# Patient Record
Sex: Female | Born: 1960 | Hispanic: Refuse to answer | Marital: Married | Smoking: Never smoker
Health system: Southern US, Community
[De-identification: ages and names within clinical notes are randomized; demographics above are authoritative.]

## PROBLEM LIST (undated history)

## (undated) DIAGNOSIS — I10 Essential (primary) hypertension: Secondary | ICD-10-CM

## (undated) DIAGNOSIS — D649 Anemia, unspecified: Secondary | ICD-10-CM

## (undated) DIAGNOSIS — I5042 Chronic combined systolic (congestive) and diastolic (congestive) heart failure: Secondary | ICD-10-CM

## (undated) DIAGNOSIS — E079 Disorder of thyroid, unspecified: Secondary | ICD-10-CM

## (undated) DIAGNOSIS — E785 Hyperlipidemia, unspecified: Secondary | ICD-10-CM

## (undated) HISTORY — DX: Disorder of thyroid, unspecified: E07.9

## (undated) HISTORY — DX: Hyperlipidemia, unspecified: E78.5

## (undated) HISTORY — DX: Anemia, unspecified: D64.9

## (undated) HISTORY — PX: TUBAL LIGATION: SHX77

## (undated) HISTORY — PX: OTHER SURGICAL HISTORY: SHX169

## (undated) HISTORY — DX: Morbid (severe) obesity due to excess calories: E66.01

## (undated) HISTORY — DX: Essential (primary) hypertension: I10

## (undated) HISTORY — DX: Chronic combined systolic (congestive) and diastolic (congestive) heart failure: I50.42

---

## 1998-02-12 ENCOUNTER — Emergency Department (HOSPITAL_COMMUNITY): Admission: EM | Admit: 1998-02-12 | Discharge: 1998-02-12 | Payer: Self-pay | Admitting: Emergency Medicine

## 2001-11-19 ENCOUNTER — Emergency Department (HOSPITAL_COMMUNITY): Admission: EM | Admit: 2001-11-19 | Discharge: 2001-11-19 | Payer: Self-pay | Admitting: Emergency Medicine

## 2002-09-03 ENCOUNTER — Emergency Department (HOSPITAL_COMMUNITY): Admission: EM | Admit: 2002-09-03 | Discharge: 2002-09-03 | Payer: Self-pay | Admitting: Emergency Medicine

## 2008-02-06 ENCOUNTER — Inpatient Hospital Stay (HOSPITAL_COMMUNITY): Admission: AD | Admit: 2008-02-06 | Discharge: 2008-02-06 | Payer: Self-pay | Admitting: Obstetrics & Gynecology

## 2008-02-06 DIAGNOSIS — D649 Anemia, unspecified: Secondary | ICD-10-CM | POA: Insufficient documentation

## 2008-02-06 DIAGNOSIS — D259 Leiomyoma of uterus, unspecified: Secondary | ICD-10-CM | POA: Insufficient documentation

## 2008-02-21 ENCOUNTER — Ambulatory Visit: Payer: Self-pay | Admitting: Nurse Practitioner

## 2008-02-21 DIAGNOSIS — G43909 Migraine, unspecified, not intractable, without status migrainosus: Secondary | ICD-10-CM | POA: Insufficient documentation

## 2008-02-21 DIAGNOSIS — M25569 Pain in unspecified knee: Secondary | ICD-10-CM | POA: Insufficient documentation

## 2008-02-21 LAB — CONVERTED CEMR LAB
ALT: 17 units/L (ref 0–35)
AST: 17 units/L (ref 0–37)
BUN: 14 mg/dL (ref 6–23)
Basophils Absolute: 0 10*3/uL (ref 0.0–0.1)
Basophils Relative: 1 % (ref 0–1)
Blood in Urine, dipstick: NEGATIVE
CO2: 22 meq/L (ref 19–32)
Calcium: 8.7 mg/dL (ref 8.4–10.5)
Chloride: 106 meq/L (ref 96–112)
Creatinine, Ser: 0.78 mg/dL (ref 0.40–1.20)
Eosinophils Absolute: 0.1 10*3/uL (ref 0.0–0.7)
Eosinophils Relative: 1 % (ref 0–5)
HCT: 34.5 % — ABNORMAL LOW (ref 36.0–46.0)
Lymphs Abs: 2.4 10*3/uL (ref 0.7–4.0)
MCHC: 30.1 g/dL (ref 30.0–36.0)
Monocytes Absolute: 0.7 10*3/uL (ref 0.1–1.0)
Neutrophils Relative %: 46 % (ref 43–77)
RBC: 3.98 M/uL (ref 3.87–5.11)
Sodium: 139 meq/L (ref 135–145)
TSH: 1.482 microintl units/mL (ref 0.350–5.50)
Total Bilirubin: 0.4 mg/dL (ref 0.3–1.2)
Total CHOL/HDL Ratio: 5.2
Total Protein: 7.9 g/dL (ref 6.0–8.3)
VLDL: 18 mg/dL (ref 0–40)
WBC: 5.7 10*3/uL (ref 4.0–10.5)

## 2008-02-22 ENCOUNTER — Encounter (INDEPENDENT_AMBULATORY_CARE_PROVIDER_SITE_OTHER): Payer: Self-pay | Admitting: Nurse Practitioner

## 2008-02-22 ENCOUNTER — Ambulatory Visit (HOSPITAL_COMMUNITY): Admission: RE | Admit: 2008-02-22 | Discharge: 2008-02-22 | Payer: Self-pay | Admitting: Family Medicine

## 2008-02-22 DIAGNOSIS — E7800 Pure hypercholesterolemia, unspecified: Secondary | ICD-10-CM | POA: Insufficient documentation

## 2008-02-24 ENCOUNTER — Encounter (INDEPENDENT_AMBULATORY_CARE_PROVIDER_SITE_OTHER): Payer: Self-pay | Admitting: Nurse Practitioner

## 2008-03-01 ENCOUNTER — Ambulatory Visit: Payer: Self-pay | Admitting: Obstetrics and Gynecology

## 2008-03-01 ENCOUNTER — Encounter: Payer: Self-pay | Admitting: Obstetrics and Gynecology

## 2008-03-07 ENCOUNTER — Ambulatory Visit (HOSPITAL_COMMUNITY): Admission: RE | Admit: 2008-03-07 | Discharge: 2008-03-07 | Payer: Self-pay | Admitting: Obstetrics and Gynecology

## 2008-03-20 ENCOUNTER — Ambulatory Visit: Payer: Self-pay | Admitting: Nurse Practitioner

## 2008-03-20 DIAGNOSIS — E669 Obesity, unspecified: Secondary | ICD-10-CM | POA: Insufficient documentation

## 2008-03-20 DIAGNOSIS — I110 Hypertensive heart disease with heart failure: Secondary | ICD-10-CM | POA: Insufficient documentation

## 2008-03-23 ENCOUNTER — Ambulatory Visit: Payer: Self-pay | Admitting: Obstetrics and Gynecology

## 2008-04-03 ENCOUNTER — Ambulatory Visit: Payer: Self-pay | Admitting: Nurse Practitioner

## 2008-04-05 ENCOUNTER — Telehealth (INDEPENDENT_AMBULATORY_CARE_PROVIDER_SITE_OTHER): Payer: Self-pay | Admitting: Nurse Practitioner

## 2008-05-03 ENCOUNTER — Telehealth (INDEPENDENT_AMBULATORY_CARE_PROVIDER_SITE_OTHER): Payer: Self-pay | Admitting: Nurse Practitioner

## 2008-06-13 ENCOUNTER — Encounter (INDEPENDENT_AMBULATORY_CARE_PROVIDER_SITE_OTHER): Payer: Self-pay | Admitting: Nurse Practitioner

## 2008-08-10 ENCOUNTER — Emergency Department (HOSPITAL_COMMUNITY): Admission: EM | Admit: 2008-08-10 | Discharge: 2008-08-10 | Payer: Self-pay | Admitting: Emergency Medicine

## 2008-11-07 ENCOUNTER — Encounter (INDEPENDENT_AMBULATORY_CARE_PROVIDER_SITE_OTHER): Payer: Self-pay | Admitting: Nurse Practitioner

## 2009-01-17 ENCOUNTER — Ambulatory Visit: Payer: Self-pay | Admitting: Nurse Practitioner

## 2009-01-17 LAB — CONVERTED CEMR LAB
HDL goal, serum: 40 mg/dL
LDL Goal: 160 mg/dL

## 2009-01-18 ENCOUNTER — Encounter (INDEPENDENT_AMBULATORY_CARE_PROVIDER_SITE_OTHER): Payer: Self-pay | Admitting: Nurse Practitioner

## 2009-01-18 LAB — CONVERTED CEMR LAB
AST: 14 units/L (ref 0–37)
Alkaline Phosphatase: 61 units/L (ref 39–117)
BUN: 14 mg/dL (ref 6–23)
Basophils Absolute: 0 10*3/uL (ref 0.0–0.1)
CO2: 21 meq/L (ref 19–32)
Chloride: 105 meq/L (ref 96–112)
Cholesterol: 227 mg/dL — ABNORMAL HIGH (ref 0–200)
Creatinine, Ser: 0.63 mg/dL (ref 0.40–1.20)
Glucose, Bld: 86 mg/dL (ref 70–99)
Lymphs Abs: 2.4 10*3/uL (ref 0.7–4.0)
MCHC: 32 g/dL (ref 30.0–36.0)
Monocytes Absolute: 0.7 10*3/uL (ref 0.1–1.0)
Monocytes Relative: 11 % (ref 3–12)
Neutro Abs: 3.2 10*3/uL (ref 1.7–7.7)
Neutrophils Relative %: 51 % (ref 43–77)
Sodium: 139 meq/L (ref 135–145)
TSH: 0.02 microintl units/mL — ABNORMAL LOW (ref 0.350–4.500)
Total CHOL/HDL Ratio: 4.4
Total Protein: 8.5 g/dL — ABNORMAL HIGH (ref 6.0–8.3)
WBC: 6.4 10*3/uL (ref 4.0–10.5)

## 2009-01-22 ENCOUNTER — Encounter (INDEPENDENT_AMBULATORY_CARE_PROVIDER_SITE_OTHER): Payer: Self-pay | Admitting: Nurse Practitioner

## 2009-01-22 DIAGNOSIS — E890 Postprocedural hypothyroidism: Secondary | ICD-10-CM | POA: Insufficient documentation

## 2009-04-22 ENCOUNTER — Inpatient Hospital Stay (HOSPITAL_COMMUNITY): Admission: AD | Admit: 2009-04-22 | Discharge: 2009-04-22 | Payer: Self-pay | Admitting: Obstetrics & Gynecology

## 2009-12-10 ENCOUNTER — Emergency Department (HOSPITAL_COMMUNITY): Admission: EM | Admit: 2009-12-10 | Discharge: 2009-12-10 | Payer: Self-pay | Admitting: Emergency Medicine

## 2009-12-10 ENCOUNTER — Telehealth (INDEPENDENT_AMBULATORY_CARE_PROVIDER_SITE_OTHER): Payer: Self-pay | Admitting: Nurse Practitioner

## 2010-03-15 ENCOUNTER — Emergency Department (HOSPITAL_COMMUNITY): Admission: EM | Admit: 2010-03-15 | Discharge: 2010-03-15 | Payer: Self-pay | Admitting: Family Medicine

## 2010-03-20 DIAGNOSIS — Z8639 Personal history of other endocrine, nutritional and metabolic disease: Secondary | ICD-10-CM | POA: Insufficient documentation

## 2010-03-25 ENCOUNTER — Ambulatory Visit: Payer: Self-pay | Admitting: Nurse Practitioner

## 2010-03-26 ENCOUNTER — Encounter (INDEPENDENT_AMBULATORY_CARE_PROVIDER_SITE_OTHER): Payer: Self-pay | Admitting: Nurse Practitioner

## 2010-03-26 LAB — CONVERTED CEMR LAB
AST: 15 units/L (ref 0–37)
Albumin: 3.5 g/dL (ref 3.5–5.2)
Alkaline Phosphatase: 55 units/L (ref 39–117)
Basophils Absolute: 0 10*3/uL (ref 0.0–0.1)
Basophils Relative: 0 % (ref 0–1)
Calcium: 8.9 mg/dL (ref 8.4–10.5)
Chloride: 108 meq/L (ref 96–112)
Cholesterol: 191 mg/dL (ref 0–200)
Creatinine, Ser: 0.55 mg/dL (ref 0.40–1.20)
HDL: 45 mg/dL (ref >39)
Hemoglobin: 10.5 g/dL — ABNORMAL LOW (ref 12.0–15.0)
Lymphs Abs: 2.2 10*3/uL (ref 0.7–4.0)
MCHC: 30.3 g/dL (ref 30.0–36.0)
Monocytes Absolute: 0.8 10*3/uL (ref 0.1–1.0)
Monocytes Relative: 14 % — ABNORMAL HIGH (ref 3–12)
Neutrophils Relative %: 44 % (ref 43–77)
RBC: 4.17 M/uL (ref 3.87–5.11)
RDW: 15.3 % (ref 11.5–15.5)
Sodium: 136 meq/L (ref 135–145)
TSH: 0.005 microintl units/mL — ABNORMAL LOW (ref 0.350–4.500)
Total Bilirubin: 0.3 mg/dL (ref 0.3–1.2)

## 2010-03-27 ENCOUNTER — Telehealth (INDEPENDENT_AMBULATORY_CARE_PROVIDER_SITE_OTHER): Payer: Self-pay | Admitting: Nurse Practitioner

## 2010-03-27 ENCOUNTER — Encounter (INDEPENDENT_AMBULATORY_CARE_PROVIDER_SITE_OTHER): Payer: Self-pay | Admitting: Nurse Practitioner

## 2010-03-29 ENCOUNTER — Ambulatory Visit (HOSPITAL_COMMUNITY): Admission: RE | Admit: 2010-03-29 | Discharge: 2010-03-29 | Payer: Self-pay | Admitting: Internal Medicine

## 2010-04-01 ENCOUNTER — Telehealth (INDEPENDENT_AMBULATORY_CARE_PROVIDER_SITE_OTHER): Payer: Self-pay | Admitting: Nurse Practitioner

## 2010-04-01 ENCOUNTER — Encounter (INDEPENDENT_AMBULATORY_CARE_PROVIDER_SITE_OTHER): Payer: Self-pay | Admitting: Nurse Practitioner

## 2010-04-08 ENCOUNTER — Telehealth (INDEPENDENT_AMBULATORY_CARE_PROVIDER_SITE_OTHER): Payer: Self-pay | Admitting: Nurse Practitioner

## 2010-04-16 ENCOUNTER — Encounter (HOSPITAL_COMMUNITY): Admission: RE | Admit: 2010-04-16 | Discharge: 2010-06-26 | Payer: Self-pay | Admitting: Internal Medicine

## 2010-04-25 ENCOUNTER — Ambulatory Visit: Payer: Self-pay | Admitting: Nurse Practitioner

## 2010-04-25 ENCOUNTER — Telehealth (INDEPENDENT_AMBULATORY_CARE_PROVIDER_SITE_OTHER): Payer: Self-pay | Admitting: Nurse Practitioner

## 2010-05-01 ENCOUNTER — Telehealth (INDEPENDENT_AMBULATORY_CARE_PROVIDER_SITE_OTHER): Payer: Self-pay | Admitting: Nurse Practitioner

## 2010-05-23 ENCOUNTER — Telehealth (INDEPENDENT_AMBULATORY_CARE_PROVIDER_SITE_OTHER): Payer: Self-pay | Admitting: Nurse Practitioner

## 2010-05-24 ENCOUNTER — Encounter (INDEPENDENT_AMBULATORY_CARE_PROVIDER_SITE_OTHER): Payer: Self-pay | Admitting: Nurse Practitioner

## 2010-05-29 ENCOUNTER — Encounter (INDEPENDENT_AMBULATORY_CARE_PROVIDER_SITE_OTHER): Payer: Self-pay | Admitting: Nurse Practitioner

## 2010-06-13 ENCOUNTER — Ambulatory Visit: Payer: Self-pay | Admitting: Nurse Practitioner

## 2010-06-13 ENCOUNTER — Other Ambulatory Visit: Admission: RE | Admit: 2010-06-13 | Discharge: 2010-06-13 | Payer: Self-pay | Admitting: Internal Medicine

## 2010-06-17 ENCOUNTER — Encounter (INDEPENDENT_AMBULATORY_CARE_PROVIDER_SITE_OTHER): Payer: Self-pay | Admitting: Nurse Practitioner

## 2010-06-19 ENCOUNTER — Encounter (INDEPENDENT_AMBULATORY_CARE_PROVIDER_SITE_OTHER): Payer: Self-pay | Admitting: Nurse Practitioner

## 2010-09-16 ENCOUNTER — Telehealth (INDEPENDENT_AMBULATORY_CARE_PROVIDER_SITE_OTHER): Payer: Self-pay | Admitting: Nurse Practitioner

## 2010-11-10 ENCOUNTER — Encounter: Payer: Self-pay | Admitting: Internal Medicine

## 2010-11-17 LAB — CONVERTED CEMR LAB
Bilirubin Urine: NEGATIVE
Blood in Urine, dipstick: NEGATIVE
Free T4: 2.34 ng/dL — ABNORMAL HIGH (ref 0.80–1.80)
OCCULT 1: NEGATIVE
Protein, U semiquant: NEGATIVE
Specific Gravity, Urine: >=1.03
T3, Free: 6.9 pg/mL — ABNORMAL HIGH (ref 2.3–4.2)
Urobilinogen, UA: 0.2
WBC Urine, dipstick: NEGATIVE
pH: 5

## 2010-11-21 NOTE — Progress Notes (Signed)
Summary: Urgent care visit - Dx Bronchitis  Phone Note Call from Patient Call back at [phone]   Summary of Call: think i had the flu now chest is hurting so bad hard to breath//please call back Initial call taken by: Arta Bruce,  December 10, 2009 8:17 AM  Follow-up for Phone Call        called [phone] pt states she went to Urgent Care and was treated for the flu.  She states that she has been sick since last Wednesday and she said she tried to take care of her illness herself and she couldn't take it any more when she started having the chest congestion and rattling in her chest.  She said that she will need to schedule a follow-up with her provider in 7-10 days.  She said that she would call back to schedule it.  Will place urgent care visit on your desk. Follow-up by: Levon Hedger,  December 11, 2009 4:14 PM  Additional Follow-up for Phone Call Additional follow up Details #1::        Visit reviewed - pt was dx with bronchitis. She was prescribed a z-pack, Albuterol MDI and codeine-guaifenesin for cough. She was advised to return to work without restrictions. Additional Follow-up by: [name] FNP,  December 12, 2009 8:43 AM

## 2010-11-21 NOTE — Assessment & Plan Note (Signed)
Summary: HTN   Vital Signs:  Patient profile:   50 year old female LMP:     03/13/2010 Height:      64.5 inches Weight:      253.9 pounds BMI:     43.06 BSA:     2.18 Temp:     98.3 degrees F oral Pulse rate:   93 / minute Pulse rhythm:   regular Resp:     20 per minute BP sitting:   164 / 75  (left arm) Cuff size:   large  Vitals Entered By: Levon Hedger (March 25, 2010 8:39 AM) CC: pt states it has been a while and need to be seen, Hypertension Management, Lipid Management Is Patient Diabetic? No  Does patient need assistance? Functional Status Self care Ambulation Normal LMP (date): 03/13/2010     Enter LMP: 03/13/2010 Last PAP Result negative for malignancy; + trich   CC:  pt states it has been a while and need to be seen, Hypertension Management, and Lipid Management.  History of Present Illness:  Pt into the office with complaints of heavy bleeding during menses.  Urgent care visit - May 27th Left arm tingling - occurs mostly at night She was given mobic which she is taken as ordered but has seen very little effect "I think it is arthritis"  Fibroids - known history Pt still has menses every month with heavy flow. Mother went through menopause in med 24's so pt is expecting to start soon.  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 Pt is married  Hypertension History:      She denies headache, chest pain, and palpitations.  Pt has taken any BP medication in over 1 year.  She has stopped eating Pork in an attempt to lower her blood pressure.        Positive major cardiovascular risk factors include hyperlipidemia and hypertension.  Negative major cardiovascular risk factors include female age less than 5 years old, no history of diabetes, negative family history for  ischemic heart disease, and non-tobacco-user status.        Further assessment for target organ damage reveals no history of ASHD, cardiac end-organ damage (CHF/LVH), stroke/TIA, peripheral vascular disease, renal insufficiency, or hypertensive retinopathy.    Lipid Management History:      Positive NCEP/ATP III risk factors include hypertension.  Negative NCEP/ATP III risk factors include female age less than 57 years old, non-diabetic, no family history for ischemic heart disease, non-tobacco-user status, no ASHD (atherosclerotic heart disease), no prior stroke/TIA, no peripheral vascular disease, and no history of aortic aneurysm.        She expresses no side effects from her lipid-lowering medication.  Comments include: Pt was previously on pravachol but she has not been taking in the past year.  The patient denies any symptoms to suggest myopathy or liver disease.      Allergies (verified): No Known Drug Allergies  Review of Systems CV:  Denies chest pain or discomfort. Resp:  Denies cough. GI:  Denies abdominal pain, nausea, and vomiting. Neuro:  Complains of headaches; during menses.  Physical Exam  General:  alert.  obese Head:  normocephalic.   Mouth:  poor dentation Lungs:  normal breath sounds.   Heart:  normal rate and regular rhythm.   Abdomen:  obese Msk:  up to the exam table active ROM Neurologic:  alert & oriented X3.   Psych:  Oriented X3.     Impression & Recommendations:  Problem # 1:  HYPERTENSION, BENIGN ESSENTIAL (ICD-401.1) BP is elevated  Will start medication DASH diet reviewed handout given Her updated medication list for this problem includes:    Lisinopril-hydrochlorothiazide 10-12.5 Mg Tabs (Lisinopril-hydrochlorothiazide) ..... One tablet by mouth daily for blood pressure  Orders: T-Comprehensive Metabolic Panel ([id number])  Problem # 2:  HYPERCHOLESTEROLEMIA (ICD-272.0) will check cholesterol labs today advised pt that she will likely  need to start medications Her updated medication list for this problem includes:    Pravastatin Sodium 40 Mg Tabs (Pravastatin sodium) ..... Hold  Orders: T-Lipid Profile ([id number])  Problem # 3:  FIBROIDS, UTERUS (ICD-218.9) reviewed with pt known dx - still with menses monthly  Problem # 4:  ANEMIA (ICD-285.9) will check labs today pt has a hx of fibroids  The following medications were removed from the medication list:    Tandem F 162-115.2-1 Mg Caps (Ferrous fum-iron polysacch-fa) .Marland Kitchen... 1 tablet by mouth daily Her updated medication list for this problem includes:    Ferrous Sulfate 325 (65 Fe) Mg Tabs (Ferrous sulfate) ..... One tablet by mouth daily  Orders: T-CBC w/Diff ([id number])  Problem # 5:  OBESITY (ICD-278.00) 20 pounds weight loss since last visit advised pt that she still needs to strive to lose weight Orders: Rapid HIV  ([id number])  Complete Medication List: 1)  Naprosyn 500 Mg Tabs (Naproxen) .Marland Kitchen.. 1 tablet by mouth two times a day as needed for knee pain 2)  Lisinopril-hydrochlorothiazide 10-12.5 Mg Tabs (Lisinopril-hydrochlorothiazide) .... One tablet by mouth daily for blood pressure 3)  Pravastatin Sodium 40 Mg Tabs (Pravastatin sodium) .... Hold 4)  Ferrous Sulfate 325 (65 Fe) Mg Tabs (Ferrous sulfate) .... One tablet by mouth daily 5)  Meloxicam 7.5 Mg Tabs (Meloxicam) .... One tablet by mouth two times a day as needed for pain  Other Orders: T-TSH ([id number])  Hypertension Assessment/Plan:      The patient's hypertensive risk group is category B: At least one risk factor (excluding diabetes) with no target organ damage.  Her calculated 10 year risk of coronary heart disease is 9 %.  Today's blood pressure is 164/75.  Her blood pressure goal is < 140/90.  Lipid Assessment/Plan:      Based on NCEP/ATP III, the patient's risk factor category is "2 or more risk factors and a calculated 10 year CAD risk of < 20%".  The patient's lipid goals are as  follows: Total cholesterol goal is 200; LDL cholesterol goal is 160; HDL cholesterol goal is 40; Triglyceride goal is 150.    Patient Instructions: 1)  Your labs will be checked today and you will be notified of the results 2)  Blood pressure - high today 3)  You will need to start blood pressure medication - lisinopril/HCTZ 10/12.5 by mouth and take daily 4)  monitor sodium in your diet 5)  Cholesterol - will be rechecked today.  You were on medications before but will wait to see what labs are before restarting 6)  Scheduled your next appointment for a complete physical exam 7)  You will need PAP, mammogram, u/a, microalbumin and review labs from today.   8)  Take your blood pressure medication before this visit Prescriptions: LISINOPRIL-HYDROCHLOROTHIAZIDE 10-12.5 MG TABS (LISINOPRIL-HYDROCHLOROTHIAZIDE) One tablet by mouth daily for blood pressure  #  30 x 1   Entered and Authorized by:   [name] FNP   Signed by:   [name] FNP on 03/25/2010   Method used:   Print then Give to Patient   RxID:   [id number]   Appended Document: HTN     Allergies: No Known Drug Allergies   Complete Medication List: 1)  Naprosyn 500 Mg Tabs (Naproxen) .Marland Kitchen.. 1 tablet by mouth two times a day as needed for knee pain 2)  Lisinopril-hydrochlorothiazide 10-12.5 Mg Tabs (Lisinopril-hydrochlorothiazide) .... One tablet by mouth daily for blood pressure 3)  Pravastatin Sodium 40 Mg Tabs (Pravastatin sodium) .... Hold 4)  Ferrous Sulfate 325 (65 Fe) Mg Tabs (Ferrous sulfate) .... One tablet by mouth daily 5)  Meloxicam 7.5 Mg Tabs (Meloxicam) .... One tablet by mouth two times a day as needed for pain   Laboratory Results  Date/Time Received: March 25, 2010 10:51 AM   Other Tests  Rapid HIV: negative

## 2010-11-21 NOTE — Progress Notes (Signed)
Summary: Endocrinology referral  Phone Note Outgoing Call   Summary of Call: refer to [facility] endocrinology Hyperthyroidism attached labs, thyroid uptake scan, and thyroid ultrasound results with referral Initial call taken by: [name] FNP,  April 25, 2010 10:29 AM

## 2010-11-21 NOTE — Miscellaneous (Signed)
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

## 2010-11-21 NOTE — Assessment & Plan Note (Signed)
Summary: Hyperthyroidism   Vital Signs:  Patient profile:   50 year old female Weight:      231.0 pounds BMI:     39.18 Temp:     98.0 degrees F oral Pulse rate:   88 / minute Pulse rhythm:   regular Resp:     20 per minute BP sitting:   122 / 70  (left arm) Cuff size:   large  Vitals Entered By: Levon Hedger (April 25, 2010 9:37 AM) CC: menstrual cycle started today...results from lab visit, Hypertension Management, Lipid Management Is Patient Diabetic? No Pain Assessment Patient in pain? no       Does patient need assistance? Ambulation Normal   CC:  menstrual cycle started today...results from lab visit, Hypertension Management, and Lipid Management.  History of Present Illness:  Pt into the office for f/u on thyroid uptake scan. She was orginally scheduled for a CPE but she is currently on her menses Pt had a history of hyperthyroidism dating back to 2010 but pt never came back for f/u. With recent check in 03/2010 TSH still low and pt was sent for thyroid ultrasound and most recently a thyroid uptake scan which shows diffusely enlarged gland without focal lesions  Family history of hyperthyroidism - sister also has hyperthyroidism and underwent iodine radiation therapy.  Hypertension History:      She denies headache, chest pain, and palpitations.  She notes no problems with any antihypertensive medication side effects.        Positive major cardiovascular risk factors include hyperlipidemia and hypertension.  Negative major cardiovascular risk factors include female age less than 49 years old, no history of diabetes, negative family history for ischemic heart disease, and non-tobacco-user status.        Further assessment for target organ damage reveals no history of ASHD, cardiac end-organ damage (CHF/LVH), stroke/TIA, peripheral vascular disease, renal insufficiency, or hypertensive retinopathy.    Lipid Management History:      Positive NCEP/ATP III risk factors  include hypertension.  Negative NCEP/ATP III risk factors include female age less than 40 years old, non-diabetic, no family history for ischemic heart disease, non-tobacco-user status, no ASHD (atherosclerotic heart disease), no prior stroke/TIA, no peripheral vascular disease, and no history of aortic aneurysm.        Comments include: pt is not taking medication - last lipids are doing well.      Habits & Providers  Alcohol-Tobacco-Diet     Tobacco Status: never  Exercise-Depression-Behavior     Drug Use: no     Seat Belt Use: 100     Sun Exposure: frequently  Comments: PHQ-9 score = 4  Allergies (verified): No Known Drug Allergies  Review of Systems General:  Complains of sweats and weight loss; denies sleep disorder; 20 pound weight loss since her last visit without intention. CV:  Denies palpitations. Resp:  Denies cough. GI:  Denies diarrhea.  Physical Exam  General:  alert.   Head:  normocephalic.   Lungs:  normal breath sounds.   Heart:  normal rate and regular rhythm.   Abdomen:  normal bowel sounds.   Msk:  up to the exam table Neurologic:  alert & oriented X3.   Skin:  color normal.   Psych:  Oriented X3.     Impression & Recommendations:  Problem # 1:  HYPERTHYROIDISM (ICD-242.90) thyroid uptake results reviewed with pt will refer to endocrinology  will start pt on methimazole - one tablet daily for 1 week then  increase to two times a day  Her updated medication list for this problem includes:    Methimazole 5 Mg Tabs (Methimazole) ..... One tablet by mouth two times a day for thyroid  Orders: Endocrinology Referral (Endocrine)  Problem # 2:  OBESITY (ICD-278.00) 20 pound weight loss since last visit - most likely due to #1  Problem # 3:  HYPERTENSION, BENIGN ESSENTIAL (ICD-401.1) BP stable at this time continue current medications Her updated medication list for this problem includes:    Lisinopril-hydrochlorothiazide 10-12.5 Mg Tabs  (Lisinopril-hydrochlorothiazide) ..... One tablet by mouth daily for blood pressure  Problem # 4:  HYPERCHOLESTEROLEMIA (ICD-272.0) no current meds labs ok during last visit Her updated medication list for this problem includes:    Pravastatin Sodium 40 Mg Tabs (Pravastatin sodium) ..... Hold  Complete Medication List: 1)  Naprosyn 500 Mg Tabs (Naproxen) .Marland Kitchen.. 1 tablet by mouth two times a day as needed for knee pain 2)  Lisinopril-hydrochlorothiazide 10-12.5 Mg Tabs (Lisinopril-hydrochlorothiazide) .... One tablet by mouth daily for blood pressure 3)  Pravastatin Sodium 40 Mg Tabs (Pravastatin sodium) .... Hold 4)  Ferrous Sulfate 325 (65 Fe) Mg Tabs (Ferrous sulfate) .... One tablet by mouth daily 5)  Meloxicam 7.5 Mg Tabs (Meloxicam) .... One tablet by mouth two times a day as needed for pain 6)  Methimazole 5 Mg Tabs (Methimazole) .... One tablet by mouth two times a day for thyroid  Hypertension Assessment/Plan:      The patient's hypertensive risk group is category B: At least one risk factor (excluding diabetes) with no target organ damage.  Her calculated 10 year risk of coronary heart disease is 5 %.  Today's blood pressure is 122/70.  Her blood pressure goal is < 140/90.  Lipid Assessment/Plan:      Based on NCEP/ATP III, the patient's risk factor category is "0-1 risk factors".  The patient's lipid goals are as follows: Total cholesterol goal is 200; LDL cholesterol goal is 160; HDL cholesterol goal is 40; Triglyceride goal is 150.    Patient Instructions: 1)  Hyperthyroidism - your thyroid uptake scan does show that you have an enlarged thyroid but no worrisome lesions.   2)  This means your thyroid is most likely working too hard. 3)  Will restart methamizole 5mg  by mouth two times a day  4)  You will be referred to endocrinology and will be notified of the time/date of the appointment. 5)  Schedule lab visit in 6 weeks for TSH, Free T3 and free T4 6)  Schedule an appointment  for a complete physical exam 7)  You will need PAP, EKG, u/a, microalbumin, rectal, tdap, mammogram Prescriptions: NAPROSYN 500 MG  TABS (NAPROXEN) 1 tablet by mouth two times a day as needed for knee pain  #50 x 1   Entered and Authorized by:   [name] FNP   Signed by:   [name] FNP on 04/25/2010   Method used:   Electronically to        [facility] (retail)       [address]       [place], [place]  [address]       Ph: [phone]       Fax: [phone]   RxID:   [id number] METHIMAZOLE 5 MG TABS (METHIMAZOLE) One tablet by mouth two times a day for thyroid  #60 x 1   Entered and Authorized by:   [name]  FNP   Signed by:   [name] FNP on 04/25/2010   Method used:   Electronically to        [facility] (retail)       [address]       [place], [place]  [address]       Ph: [phone]       Fax: [phone]   RxID:   [id number]

## 2010-11-21 NOTE — Letter (Signed)
Summary: [facility]/OUTPATIENT [facility]MISTR[facility]  [facility]PAT[facility]   Imported By: Arta Bruce 05/30/2010 09:59:38  _____________________________________________________________________  External Attachment:    Type:   Image     Comment:   External Document

## 2010-11-21 NOTE — Progress Notes (Signed)
Summary: Office Visit//depression screening  Office Visit//depression screening   Imported By: Arta Bruce 04/25/2010 14:03:23  _____________________________________________________________________  External Attachment:    Type:   Image     Comment:   External Document

## 2010-11-21 NOTE — Letter (Signed)
Summary: [facility]//OUTPT CHEMISTR[facility]  [facility]//OUTPT [facility]MISTR[facility]   Imported By: Arta Bruce 05/30/2010 09:58:20  _____________________________________________________________________  External Attachment:    Type:   Image     Comment:   External Document

## 2010-11-21 NOTE — Progress Notes (Signed)
Summary: NOSE KEEPS BLEEDING  Phone Note Call from Patient Call back at Home Phone [phone]   Reason for Call: Talk to Nurse Summary of Call: Brittney Ross PT. MS [name] SAYS HER NOSE KEEPS BLEEDING, AND SHE DOESNT KNOW WHY. Initial call taken by: Leodis Rains,  September 16, 2010 11:17 AM  Follow-up for Phone Call        Pt. has had several episodes of bright red blood coming from her nose. Advised to increase humidity in her house, use Saline nasal spray several times a day, May use vaseline inside her nose. Drink plenty of water. Currently not taking her iron medication advised to take daily as prescribed. Discussed holding pressure just below bridge of nose for if after doing this 3 different times and bleeding continues go to Urgent Care. She said bleeding has stopped on it's own. Encouraged to check BP @ drug store if high needs to call and schedule an appt. Or if nose bleeds continues need to schedule appt. Follow-up by: Gaylyn Cheers RN,  September 16, 2010 11:57 AM

## 2010-11-21 NOTE — Progress Notes (Signed)
Summary: TALK TO NURSE  Phone Note Call from Patient   Reason for Call: Talk to Nurse Summary of Call: PT WANTS TO TALK TO NURSE . Fuller Song HER @ [phone] THANK YOU  Initial call taken by: Cheryll Dessert,  April 08, 2010 9:23 AM  Follow-up for Phone Call        Left message on answering machine to return call.  Dutch Quint RN  April 08, 2010 3:14 PM  Spoke with pt. and discussed symptoms she is having -- feeling hot, nauseated at times, SOB -- wanted to know if this was related to hyperthyroidism.  Discussed function of thyroid and her symptoms.  Stressed importance of following up with additional labwork and meds.  Dutch Quint RN  April 08, 2010 4:40 PM   Additional Follow-up for Phone Call Additional follow up Details #1::        reviewed. approrpriate discussion with pt Additional Follow-up by: [name] FNP,  April 08, 2010 5:11 PM

## 2010-11-21 NOTE — Progress Notes (Signed)
Summary: Lab results and thyroid US  Phone Note Outgoing Call   Summary of Call: labs done during recent office visit show: 1. Anemia - known history. pt should restart her iron supplement - ferrous sulfate 325mg  by mouth twice daily 2.  hyperthyroidism - her thyroid is working too hard. noted 1 year ago with previous labs but pt did not come back so could not do further testing. since labs still abnormal would recommend that she have a thyroid ultrasound to see if she has any lumps or abnormalities in her thyroid gland.  Thyroid regulates metabolism (weight gain and loss), temperature, constipation and to some extent may cause anxiety and palpitations in hyperthyroidism Initial call taken by: [name] FNP,  March 27, 2010 8:06 AM  Follow-up for Phone Call        Spoke with pt. and advised of lab results and provider's recommendations -- will call pt. back with appt.  Dutch Quint RN  March 27, 2010 3:05 PM   Additional Follow-up for Phone Call Additional follow up Details #1::        order ptinted ok to schedule appt Additional Follow-up by: [name] FNP,  March 27, 2010 3:25 PM    Additional Follow-up for Phone Call Additional follow up Details #2::    Spoke with pt. - confirmed appt. 03/29/10. Follow-up by: Dutch Quint RN,  March 27, 2010 3:47 PM

## 2010-11-21 NOTE — Progress Notes (Signed)
Summary: NEEDS NOT FOR JOB  Phone Note Call from Patient Call back at Home Phone [phone]   Summary of Call: MARTIN PT. [employer] [place] SAYS THAT HER JOB NEEDS A NOTE FOR HER JOB. 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 Initial call taken by: Leodis Rains,  May 01, 2010 9:46 AM  Follow-up for Phone Call        forward to N.Daphine Deutscher, fnp Follow-up by: Levon Hedger,  May 01, 2010 12:29 PM  Additional Follow-up for Phone Call Additional follow up Details #1::        what kind of job does she do? Is she working outside? Additional Follow-up by: [name] FNP,  May 01, 2010 12:35 PM    Additional Follow-up for Phone Call Additional follow up Details #2::    called [phone] the person you are trying to reach phone is off or outside of the service area. 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  she said that she was told that she needed a note from her doctor for her to go back to work.  I then  explained to her that  she needs an office visit in order to receive a note for being out of work.  without being examed by pcp she will not get one. Hakim Minniefield informed of above. Follow-up by: Levon Hedger,  May 02, 2010 1:33 PM

## 2010-11-21 NOTE — Assessment & Plan Note (Signed)
Summary: Complete Physical Exam   Vital Signs:  Patient profile:   50 year old female LMP:     05/04/2010 Weight:      225.8 pounds BMI:     38.30 Temp:     98.3 degrees F oral Pulse rate:   100 / minute Pulse rhythm:   regular Resp:     20 per minute BP sitting:   120 / 76  (left arm) Cuff size:   large  Vitals Entered By: Levon Hedger (June 13, 2010 11:25 AM)  Nutrition Counseling: Patient's BMI is greater than 25 and therefore counseled on weight management options. CC: CPP Is Patient Diabetic? No Pain Assessment Patient in pain? no       Does patient need assistance? Functional Status Self care Ambulation Normal LMP (date): 05/04/2010     Enter LMP: 05/04/2010 Last PAP Result negative for malignancy; + trich   CC:  CPP.  History of Present Illness:  Pt into the office for a complete physical exam   PAP - Last PAP done in 2009. Menses - monthly.  Still with lots of cramps and heavy flow no family history of cervical or ovarian cancer.  Mammogram - Last mammogram in 2009.   No family of breast cancer  Optho - last eye exam was several years ago.  She used to wear glasses many years ago.  Dental - last dental exam was at lest 2 years ago.  tetanus - last done over 10 years ago  Habits & Providers  Alcohol-Tobacco-Diet     Alcohol drinks/day: 0     Tobacco Status: never  Exercise-Depression-Behavior     Does Patient Exercise: no     Have you felt down or hopeless? no     Have you felt little pleasure in things? no     Depression Counseling: not indicated; screening negative for depression     Drug Use: no     Seat Belt Use: 100     Sun Exposure: frequently  Comments: PHQ-9 score = 3  Allergies (verified): No Known Drug Allergies  Social History: Does Patient Exercise:  no  Review of Systems General:  Complains of sleep disorder and sweats; since she stopped taking her methamizole. Eyes:  Denies blurring. ENT:  Denies  earache. CV:  Denies chest pain or discomfort. Resp:  Denies cough. GI:  Denies abdominal pain. GU:  Denies discharge. MS:  Denies joint pain. Derm:  Denies lesion(s). Neuro:  Denies headaches. Psych:  Denies depression. Endo:  Denies excessive hunger.  Physical Exam  General:  alert.   Head:  normocephalic.   Eyes:  vision grossly intact, pupils equal, and pupils round.   Ears:  bil TM with bony landmarks present Nose:  External nasal examination shows no deformity or inflammation. Nasal mucosa are pink and moist without lesions or exudates. Mouth:  poor dentation Neck:  supple.   Chest Wall:  no deformities.   Breasts:  skin/areolae normal, no masses, and no abnormal thickening.   Lungs:  normal breath sounds.   Heart:  normal rate and regular rhythm.   Abdomen:  soft, non-tender, and normal bowel sounds.   Rectal:  external hemorrhoid(s).   Msk:  normal ROM.   Extremities:  no edema Neurologic:  alert & oriented X3.   Skin:  color normal.   Psych:  Oriented X3.    Pelvic Exam  Vulva:      normal appearance.   Urethra and Bladder:  Urethra--normal.   Vagina:      physiologic discharge.   Cervix:      midposition.   Uterus:      non-tender.   Adnexa:      nontender bilaterally.   Rectum:      heme negative stool, + external hemorrhoids.      Impression & Recommendations:  Problem # 1:  ROUTINE GYNECOLOGICAL EXAMINATION (ICD-V72.31) labs reviewed from previous visit guaiac negative PHQ-9 score = 3 EKG = LVH Orders: Hemoccult Guaiac-1 spec.(in office) (82270) UA Dipstick w/o Micro (manual) (40981) T- GC Chlamydia (19147) KOH/ WET Mount [phone]) Pap Smear, Thin Prep ( Collection of) (O1308)  Problem # 2:  UNSPECIFIED BREAST SCREENING (ICD-V76.10) self breast exam placcard given mammogram scheduled Orders: Mammogram (Screening) (Mammo)  Problem # 3:  HYPERTENSION, BENIGN ESSENTIAL (ICD-401.1) Bp stable continue DASH diet Her updated medication  list for this problem includes:    Lisinopril-hydrochlorothiazide 10-12.5 Mg Tabs (Lisinopril-hydrochlorothiazide) ..... One tablet by mouth daily for blood pressure  Orders: T-Urine Microalbumin w/creat. ratio [phone]) EKG w/ Interpretation (93000)  Problem # 4:  HYPERTHYROIDISM (ICD-242.90) procedure scheduled at [facility] on next week she was advised to stop methimazole 1 week prior to procedure which she has done The following medications were removed from the medication list:    Methimazole 5 Mg Tabs (Methimazole) ..... One tablet by mouth two times a day for thyroid  Problem # 5:  FIBROIDS, UTERUS (ICD-218.9) still with heavy flow perhaps will consider GYN referral after endocrinology   Complete Medication List: 1)  Naprosyn 500 Mg Tabs (Naproxen) .Marland Kitchen.. 1 tablet by mouth two times a day as needed for knee pain 2)  Lisinopril-hydrochlorothiazide 10-12.5 Mg Tabs (Lisinopril-hydrochlorothiazide) .... One tablet by mouth daily for blood pressure 3)  Pravastatin Sodium 40 Mg Tabs (Pravastatin sodium) .... Hold 4)  Ferrous Sulfate 325 (65 Fe) Mg Tabs (Ferrous sulfate) .... One tablet by mouth daily 5)  Meloxicam 7.5 Mg Tabs (Meloxicam) .... One tablet by mouth two times a day as needed for pain  Patient Instructions: 1)  Keep your appointment with the endocrinology on Monday. 2)  You will be notified of your pap smear results 3)  Follow up in this office in 6 months for high blood pressure or sooner if necessary  Laboratory Results   Urine Tests  Date/Time Received: June 13, 2010 11:37 AM   Routine Urinalysis   Color: lt. yellow Appearance: Clear Glucose: negative   (Normal Range: Negative) Bilirubin: negative   (Normal Range: Negative) Ketone: negative   (Normal Range: Negative) Spec. Gravity: >=1.030   (Normal Range: 1.003-1.035) Blood: negative   (Normal Range: Negative) pH: 5.0   (Normal Range: 5.0-8.0) Protein: negative   (Normal Range:  Negative) Urobilinogen: 0.2   (Normal Range: 0-1) Nitrite: negative   (Normal Range: Negative) Leukocyte Esterace: negative   (Normal Range: Negative)    Date/Time Received: June 14, 2010 7:44 AM   Wet Mount/KOH Source: vaginal WBC/hpf: 1-5 Bacteria/hpf: rare Clue cells/hpf: none Yeast/hpf: none Trichomonas/hpf: none  Stool - Occult Blood Hemmoccult #1: negative Date: 06/14/2010     EKG  Procedure date:  06/14/2010  Findings:      NSR, LVH   Prevention & Chronic Care Immunizations   Influenza vaccine: Not documented    Tetanus booster: Not documented    Pneumococcal vaccine: Not documented  Other Screening   Pap smear: negative for malignancy; + trich  (03/02/2008)    Mammogram: negative  (03/07/2008)   Smoking status: never  (  06/13/2010)  Lipids   Total Cholesterol: 191  (03/25/2010)   LDL: 126  (03/25/2010)   LDL Direct: Not documented   HDL: 45  (03/25/2010)   Triglycerides: 98  (03/25/2010)    SGOT (AST): 15  (03/25/2010)   SGPT (ALT): 19  (03/25/2010)   Alkaline phosphatase: 55  (03/25/2010)   Total bilirubin: 0.3  (03/25/2010)  Hypertension   Last Blood Pressure: 120 / 76  (06/13/2010)   Serum creatinine: 0.55  (03/25/2010)   Serum potassium 4.6  (03/25/2010)  Self-Management Support :    Hypertension self-management support: Not documented    Lipid self-management support: Not documented    Laboratory Results   Urine Tests    Routine Urinalysis   Color: lt. yellow Appearance: Clear Glucose: negative   (Normal Range: Negative) Bilirubin: negative   (Normal Range: Negative) Ketone: negative   (Normal Range: Negative) Spec. Gravity: >=1.030   (Normal Range: 1.003-1.035) Blood: negative   (Normal Range: Negative) pH: 5.0   (Normal Range: 5.0-8.0) Protein: negative   (Normal Range: Negative) Urobilinogen: 0.2   (Normal Range: 0-1) Nitrite: negative   (Normal Range: Negative) Leukocyte Esterace: negative   (Normal Range:  Negative)      Wet Mount Wet Mount KOH: Negative

## 2010-11-21 NOTE — Letter (Signed)
Summary: TEST ORDER FORM//ULTRASOUND //APPT DATE & TIME  TEST ORDER FORM//ULTRASOUND //APPT DATE & TIME   Imported By: Arta Bruce 03/28/2010 11:51:24  _____________________________________________________________________  External Attachment:    Type:   Image     Comment:   External Document

## 2010-11-21 NOTE — Progress Notes (Signed)
Summary: Office Visit//DEPRESSION SCREENING  Office Visit//DEPRESSION SCREENING   Imported By: Arta Bruce 06/14/2010 11:29:09  _____________________________________________________________________  External Attachment:    Type:   Image     Comment:   External Document

## 2010-11-21 NOTE — Letter (Signed)
Summary: test order from//radiology//appt date & time  test order from//radiology//appt date & time   Imported By: Arta Bruce 04/02/2010 12:36:26  _____________________________________________________________________  External Attachment:    Type:   Image     Comment:   External Document

## 2010-11-21 NOTE — Progress Notes (Signed)
Summary: [facility] NEEDS AUTHORIZATION  Phone Note Call from Patient   Reason for Call: Refill Medication Complaint: Breathing Problems Summary of Call: MARTIN PT. MS Schwall SAYS THAT HER PHARMACIST NEEDS AUTHORIZATION FOR HER LISINOPRIL. SHE USES [facility] ON [address] Initial call taken by: Leodis Rains,  May 23, 2010 10:33 AM  Follow-up for Phone Call        forward to N. Daphine Deutscher, fnp Follow-up by: Levon Hedger,  May 23, 2010 11:50 AM  Additional Follow-up for Phone Call Additional follow up Details #1::        Meds sent to [facility] -  inform pt Additional Follow-up by: [name] FNP,  May 23, 2010 12:16 PM    Additional Follow-up for Phone Call Additional follow up Details #2::    Levon Hedger  May 24, 2010 10:29 AM pt informed. Follow-up by: Levon Hedger,  May 24, 2010 10:29 AM  Prescriptions: LISINOPRIL-HYDROCHLOROTHIAZIDE 10-12.5 MG TABS (LISINOPRIL-HYDROCHLOROTHIAZIDE) One tablet by mouth daily for blood pressure  #30 x 5   Entered and Authorized by:   [name] FNP   Signed by:   [name] FNP on 05/23/2010   Method used:   Electronically to        [facility] (retail)       [address]       [place], [place]  [address]       Ph: [phone]       Fax: [phone]   RxID:   [id number]

## 2010-11-21 NOTE — Letter (Signed)
[facility]   Im[facility] By: Arta Bruce 05/30/2010 09:55:23  _____________________________________________________________________  External Attachment:    Type:   Image     Comment:   External Document

## 2010-11-21 NOTE — Letter (Signed)
Summary: Handout Printed  Printed Handout:  - Hyperthyroidism 

## 2010-11-21 NOTE — Progress Notes (Signed)
Summary: Need Thyroid uptake Scan  Phone Note Outgoing Call   Summary of Call: spoke with pt via phone regarding thyroid ultrasound results. She will need thyroid uptake scan given her recent results Overall prominent thyroid which is very inhomogeneous with diffuse small hypoechoic nodules , with the largest measuring 7 mm in diameter. Schedule pt on any morning around 11AM she is not taking any thyroid medciations at this time   Initial call taken by: [name] FNP,  April 01, 2010 3:51 PM  Follow-up for Phone Call        I MADE HER AN APPT  04-16-10 @ 11:00am [facility]. PPT IS AWARE OF THE APPT . Follow-up by: Cheryll Dessert,  April 01, 2010 4:55 PM  Additional Follow-up for Phone Call Additional follow up Details #1::        noted Additional Follow-up by: [name] FNP,  April 01, 2010 5:05 PM

## 2010-11-21 NOTE — Letter (Signed)
Summary: *HSN Results Follow up  [facility]  [address] [place], [place] [address]   Phone: [phone]  Fax: [phone]      06/19/2010   Brittney Ross [address] [place], [place]  [address]   Dear  Ms. [name],                            ____S.Drinkard,FNP   ____D. Gore,FNP       ____B. McPherson,MD   ____V. Rankins,MD    ____E. Mulberry,MD    __X__N. Daphine Deutscher, FNP  ____D. Reche Dixon, MD    ____K. Philipp Deputy, MD    ____Other     This letter is to inform you that your recent test(s):  ___X____Pap Smear    _______Lab Test     _______X-ray    ___X____ is within acceptable limits  _______ requires a medication change  _______ requires a follow-up lab visit  _______ requires a follow-up visit with your provider   Comments: Pap Smear is normal.       _________________________________________________________ If you have any questions, please contact our office [phone].                    Sincerely,    [name] FNP [facility]

## 2011-01-26 LAB — URINALYSIS, ROUTINE W REFLEX MICROSCOPIC
Bilirubin Urine: NEGATIVE
Ketones, ur: NEGATIVE mg/dL
Nitrite: NEGATIVE
Protein, ur: 30 mg/dL — AB
Specific Gravity, Urine: >1.03 — ABNORMAL HIGH (ref 1.005–1.030)
pH: 5.5 (ref 5.0–8.0)

## 2011-01-26 LAB — POCT PREGNANCY, URINE: Preg Test, Ur: NEGATIVE

## 2011-03-04 NOTE — Group Therapy Note (Signed)
NAMECRYSTALYN, Brittney Ross NO.:  [id number]   MEDICAL RECORD NO.:  [id number]          PATIENT TYPE:  WOC   LOCATION:  [facility]                   FACILITY:  [facility]   PHYSICIAN:  Argentina Donovan, MD        DATE OF BIRTH:  08-Jun-1961   DATE OF SERVICE:                                  CLINIC NOTE   HISTORY:  The patient is a 50 year old African-American gravida 3, para  3-0-0-3 who weighs 274 pounds and she is 5 feet 6 inches tall.  She was  referred from [facility] because of dysmenorrhea, multiple fibroids  and severe headaches.  In discussing with the patient, the headaches  come starting 2 days before her period and extending through the period  and disappear once the period is over.  The dysmenorrhea has a slightly  increased flow during her period.  It has only occurred during the last  off 4-5 months.  Ultrasound was done which showed some multiple  fibroids, however, they did not seem extremely large.  The uterus  measures 13 x 8 x 7.6 and the largest fibroid is 6 x 4 x 6 cm at the  posterior junction of the fundus.   PHYSICAL EXAMINATION:  BREASTS:  Symmetrical, pendulous and large with  no dominant masses, no nipple discharge.  ABDOMEN:  Abdomen is soft, flat, nontender.  No masses or organomegaly.  Previous vertical surgical scar from her previous 3 cesarean sections.  GU:  The external genitalia is normal.  BUS is within normal limits.  Vagina is clean and well rugated.  The cervix is clean, nulliparous.  The uterus upon palpation is markedly irregular that feels like a  pedunculated fibroid to the right of the lower uterine segment.  The  adnexa could not be palpated because of the habitus of the patient.   She has been seen at [facility] and was apparently fully worked up  blood work-wise which I have requested that she obtains copies of when  she comes back.  She said her cholesterol was high.  They told her she  had a borderline high blood pressure and  put her on a diet, but did not  give her any medicine although she said the blood pressure was in the  200s.  Today when I checked her, she had a blood pressure of 180/105.  I  am going to start her on a blood pressure medication and have her come  back in 3 weeks and will evaluate that.  If we can get her under  control, I think she will be very good candidate to perhaps see if we  can handle the menstrual headaches and her dysmenorrhea with oral  contraceptives.  The patient in the meanwhile has been given 1.8 mg of  estradiol daily for the next 7 days to see whether or not her headaches  started 2 days ago to see whether or not this will help her eliminate  her headaches.  I am going to schedule and put her on Diovan 160 q.a.m.  and hydrochlorothiazide 25 q.a.m.  We will have her  return if this blood  pressure is under control.  We will give her something for her  cholesterol once we get the reports from [facility].   IMPRESSION:  Menstrual headaches with dysmenorrhea and multiple  fibroids.           ______________________________  Argentina Donovan, MD     PR/MEDQ  D:  03/01/2008  T:  03/01/2008  Job:  [id number]

## 2011-07-14 ENCOUNTER — Encounter: Payer: Self-pay | Admitting: *Deleted

## 2011-07-15 LAB — GC/CHLAMYDIA PROBE AMP, GENITAL: GC Probe Amp, Genital: NEGATIVE

## 2011-07-15 LAB — WET PREP, GENITAL
Clue Cells Wet Prep HPF POC: NONE SEEN
Yeast Wet Prep HPF POC: NONE SEEN

## 2011-07-15 LAB — CBC
MCV: 82.7
Platelets: 242
WBC: 5.3

## 2011-07-17 ENCOUNTER — Ambulatory Visit (INDEPENDENT_AMBULATORY_CARE_PROVIDER_SITE_OTHER): Payer: BC Managed Care – PPO | Admitting: Obstetrics & Gynecology

## 2011-07-17 ENCOUNTER — Other Ambulatory Visit (HOSPITAL_COMMUNITY)
Admission: RE | Admit: 2011-07-17 | Discharge: 2011-07-17 | Disposition: A | Discharge status: Home / Self Care | Payer: BC Managed Care – PPO | Source: Ambulatory Visit | Attending: Obstetrics & Gynecology | Admitting: Obstetrics & Gynecology

## 2011-07-17 ENCOUNTER — Encounter: Payer: Self-pay | Admitting: Obstetrics & Gynecology

## 2011-07-17 DIAGNOSIS — R87619 Unspecified abnormal cytological findings in specimens from cervix uteri: Secondary | ICD-10-CM | POA: Insufficient documentation

## 2011-07-17 DIAGNOSIS — D259 Leiomyoma of uterus, unspecified: Secondary | ICD-10-CM

## 2011-07-17 DIAGNOSIS — D219 Benign neoplasm of connective and other soft tissue, unspecified: Secondary | ICD-10-CM

## 2011-07-17 LAB — POCT PREGNANCY, URINE: Preg Test, Ur: NEGATIVE

## 2011-07-17 LAB — CBC WITH DIFFERENTIAL/PLATELET
Eosinophils Relative: 2 % (ref 0–5)
HCT: 31.9 % — ABNORMAL LOW (ref 36.0–46.0)
Hemoglobin: 9.8 g/dL — ABNORMAL LOW (ref 12.0–15.0)
Lymphocytes Relative: 32 % (ref 12–46)
Lymphs Abs: 1.7 10*3/uL (ref 0.7–4.0)
MCV: 85.5 fL (ref 78.0–100.0)
Monocytes Absolute: 0.6 10*3/uL (ref 0.1–1.0)
Monocytes Relative: 12 % (ref 3–12)
RBC: 3.73 MIL/uL — ABNORMAL LOW (ref 3.87–5.11)
WBC: 5.3 10*3/uL (ref 4.0–10.5)

## 2011-07-17 LAB — COMPREHENSIVE METABOLIC PANEL
Alkaline Phosphatase: 54 U/L (ref 39–117)
CO2: 22 mEq/L (ref 19–32)
Creat: 0.83 mg/dL (ref 0.50–1.10)
Glucose, Bld: 85 mg/dL (ref 70–99)
Total Bilirubin: 0.4 mg/dL (ref 0.3–1.2)

## 2011-07-17 NOTE — Progress Notes (Signed)
  Subjective:    Patient ID: Brittney Ross, female    DOB: September 14, 1961, 50 y.o.   MRN: [id number]  HPI Ms. Vallo is a 50 yo AA G3P3 with a long history of dysmenorrhea and menorrhagia.  She has a known history of fibroids but her last u/s was in 2009. Her last pap was at [facility] and it was about a year ago.  Her last mammogram was about 2 years ago.   Review of Systems     Objective:   Physical Exam   Morbidly obese with grossly enlarged uterusEndometrial Biopsy Procedure Note  Pre-operative Diagnosis: DUB  Post-operative Diagnosis: same  Indications: abnormal uterine bleeding, enlarged uterus  Procedure Details   Urine pregnancy test was done  and result was negative.  The risks (including infection, bleeding, pain, and uterine perforation) and benefits of the procedure were explained to the patient and Verbal informed consent was obtained.  Antibiotic prophylaxis against endocarditis was not indicated.   The patient was placed in the dorsal lithotomy position.  Bimanual exam showed the uterus to be in the neutral position.  A Graves' speculum inserted in the vagina, and the cervix prepped with povidone iodine.  Endocervical curettage with a Kevorkian curette was not performed.   A sharp tenaculum was applied to the anterior lip of the cervix for stabilization.  A sterile uterine sound was used to sound the uterus to a depth of 9.5cm.  A Pipelle endometrial aspirator was used to sample the endometrium.  Sample was sent for pathologic examination.  Condition: Stable  Complications: None  Plan  The patient was advised to call for any fever or for prolonged or severe pain or bleeding. She was advised to use NSAID as needed for mild to moderate pain. She was advised to avoid vaginal intercourse for 48 hours or until the bleeding has completely stopped.  Attending Physician Documentation: I was present for or participated in the entire procedure, including opening and closing.        Assessment & Plan:  DUB and fibroids- I will begin the work up with indicated screening procedures and labs.

## 2011-07-17 NOTE — Progress Notes (Signed)
Addended by: Sherre Lain A on: 07/17/2011 04:28 PM   Modules accepted: Orders

## 2011-07-19 ENCOUNTER — Inpatient Hospital Stay (HOSPITAL_COMMUNITY)
Admission: AD | Admit: 2011-07-19 | Discharge: 2011-07-19 | Disposition: A | Discharge status: Home / Self Care | Payer: BC Managed Care – PPO | Source: Ambulatory Visit | Attending: Obstetrics & Gynecology | Admitting: Obstetrics & Gynecology

## 2011-07-19 ENCOUNTER — Inpatient Hospital Stay (HOSPITAL_COMMUNITY): Payer: BC Managed Care – PPO

## 2011-07-19 ENCOUNTER — Encounter (HOSPITAL_COMMUNITY): Payer: Self-pay | Admitting: Obstetrics and Gynecology

## 2011-07-19 DIAGNOSIS — N946 Dysmenorrhea, unspecified: Secondary | ICD-10-CM

## 2011-07-19 DIAGNOSIS — N949 Unspecified condition associated with female genital organs and menstrual cycle: Secondary | ICD-10-CM | POA: Insufficient documentation

## 2011-07-19 DIAGNOSIS — N938 Other specified abnormal uterine and vaginal bleeding: Secondary | ICD-10-CM | POA: Insufficient documentation

## 2011-07-19 LAB — CBC
HCT: 30.9 % — ABNORMAL LOW (ref 36.0–46.0)
Hemoglobin: 9.6 g/dL — ABNORMAL LOW (ref 12.0–15.0)
MCH: 26.7 pg (ref 26.0–34.0)
MCHC: 31.1 g/dL (ref 30.0–36.0)
MCV: 85.8 fL (ref 78.0–100.0)

## 2011-07-19 MED ORDER — IBUPROFEN 800 MG PO TABS
800.0000 mg | ORAL_TABLET | Freq: Once | ORAL | Status: AC
  Administered 2011-07-19: 800 mg via ORAL
  Filled 2011-07-19: qty 1

## 2011-07-19 NOTE — Progress Notes (Signed)
Had had biopsy (patient unsure of what) on Thurs. Went up thru vagina for biopsy. Started period on Friday. At 2400 awoke and a lot of blood in bed.

## 2011-07-19 NOTE — Progress Notes (Signed)
"  I had a biopsy done by Dr. Marice Potter on Thursday 07/17/11.  I had small spotting after then, but I woke up this morning @ 0000 in a pool of blood.  I've soaked four pads since then.  I have to wear double pads."

## 2011-07-19 NOTE — ED Provider Notes (Signed)
History   She was seen in the gyn clinic 2 days ago for the complaints of dysmenorrhea and DUB. She has a known history of fibroids. An emdometrial biopsy was done. She comes in tonight because her period started and she thinks it's heavier than it should be.  She also continues to complain of dysmenorrhea.  Chief Complaint  Patient presents with  . Abdominal Cramping  . Vaginal Bleeding   HPI  Pertinent Gynecological History: Menses: flow is excessive with use of 20 pads or tampons on heaviest days Bleeding: dysfunctional uterine bleeding Contraception: tubal ligation DES exposure: unknown Blood transfusions: none Sexually transmitted diseases: no past history Previous GYN Procedures: none  Last mammogram: [facility] Date:  Last pap: normal Date:    Past Medical History  Diagnosis Date  . Hyperlipidemia   . Anemia   . Thyroid disease hyperthyroidism  . Morbid obesity   . Morbid obesity   . Hypertension     Pt states she "doesn't have HTN"    Past Surgical History  Procedure Date  . Cesarean section 12/07/79  . Cesarean section 05/17/81  . Cesarean section 09/06/89  . Tubal ligation     Family History  Problem Relation Age of Onset  . Hypertension Father   . Heart disease Mother   . Hypertension Sister     History  Substance Use Topics  . Smoking status: Never Smoker   . Smokeless tobacco: Not on file  . Alcohol Use: No    Allergies: No Known Allergies  Prescriptions prior to admission  Medication Sig Dispense Refill  . acetaminophen (TYLENOL) 325 MG tablet Take 650 mg by mouth every 6 (six) hours as needed. For pain       . ibuprofen (ADVIL,MOTRIN) 200 MG tablet Take 400 mg by mouth every 6 (six) hours as needed.        . ferrous sulfate 325 (65 FE) MG tablet Take 325 mg by mouth daily with breakfast.        . hydrochlorothiazide (HYDRODIURIL) 25 MG tablet Take 25 mg by mouth daily.        . valsartan (DIOVAN) 160 MG tablet Take 160 mg by mouth daily.            ROS Physical Exam   Blood pressure 184/82, pulse 80, temperature 99.1 F (37.3 C), temperature source Oral, resp. rate 20, height 5\' 5"  (1.651 m), weight 124.467 kg (274 lb 6.4 oz), last menstrual period 06/17/2011.  Physical Exam Minimal vaginal bleeding  MAU Course  Procedures Ultrasound done, CBC essentially unchanged MDM   Assessment and Plan  Menses-reassurance given. She has a follow up appt in the gyn clinic on Oct 25th.  Tyaira Heward C. 07/19/2011, 6:56 AM

## 2011-07-22 LAB — RAPID STREP SCREEN (MED CTR MEBANE ONLY): Streptococcus, Group A Screen (Direct): NEGATIVE

## 2011-08-14 ENCOUNTER — Encounter: Payer: Self-pay | Admitting: Family Medicine

## 2011-08-14 ENCOUNTER — Ambulatory Visit (INDEPENDENT_AMBULATORY_CARE_PROVIDER_SITE_OTHER): Payer: BC Managed Care – PPO | Admitting: Obstetrics & Gynecology

## 2011-08-14 DIAGNOSIS — I10 Essential (primary) hypertension: Secondary | ICD-10-CM

## 2011-08-14 DIAGNOSIS — R102 Pelvic and perineal pain: Secondary | ICD-10-CM

## 2011-08-14 DIAGNOSIS — D259 Leiomyoma of uterus, unspecified: Secondary | ICD-10-CM

## 2011-08-14 DIAGNOSIS — N949 Unspecified condition associated with female genital organs and menstrual cycle: Secondary | ICD-10-CM

## 2011-08-14 DIAGNOSIS — Z01818 Encounter for other preprocedural examination: Secondary | ICD-10-CM

## 2011-08-14 DIAGNOSIS — G8929 Other chronic pain: Secondary | ICD-10-CM | POA: Insufficient documentation

## 2011-08-14 MED ORDER — VALSARTAN-HYDROCHLOROTHIAZIDE 160-25 MG PO TABS: 1.0000 | ORAL_TABLET | Freq: Every day | ORAL | Status: DC

## 2011-08-14 MED ORDER — DICLOFENAC SODIUM 75 MG PO TBEC: 75.0000 mg | DELAYED_RELEASE_TABLET | Freq: Two times a day (BID) | ORAL | Status: AC

## 2011-08-14 NOTE — Patient Instructions (Signed)
Hysterectomy A hysterectomy is a procedure where your womb (uterus) is surgically taken out. It will no longer be possible to have menstrual periods or to become pregnant. Removal of the tubes and ovaries (bilateral salpingo-oopherectomy) can be done during this operation as well.   An abdominal hysterectomy is done through a large cut (incision) in the abdomen made by the surgeon.   A vaginal hysterectomy is done through the vagina. There are no abdominal incisions, but there will be incisions on the inside of the vagina.   A laparoscopic assisted vaginal hysterectomy is done through 2 or 3 small incisions in the abdomen, but the uterus is removed and passed through the vagina.   A robotic assisted hysterectomy is done is done through 4 or 5 small incisions in the abdomen, but the uterus is removed and passed through the vagina. It is likely you can go home the same day after a robotic assisted surgery. Women who are going to have a hysterectomy should be tested first to make sure there is no cancer of the cervix or in the uterus. INDICATIONS FOR HYSTERECTOMY:  Persistent abnormal bleeding.   Lasting (chronic) pelvic pain.   Endometriosis. This is when the lining of the uterus (endometrium) is misplaced outside of its normal location.   Adenomyosis. This is when the endometrium tissue grows in the muscle of the uterus.   Uterine prolapse. This is when the uterus falls down into the vagina.   Cancer of the uterus or cervix that requires a radical hysterectomy, removal of the uterus, tubes, ovaries, and surrounding lymph nodes.  LET YOUR CAREGIVER KNOW ABOUT:  Allergies (especially to medicines).   Medications taken including herbs, eye drops, over the counter medications, and creams.   Use of steroids (by mouth or creams).   Past problems with anesthetics or numbing medication.   Possibility of pregnancy, if this applies.   History of blood clots (thrombophlebitis).   History of  bleeding or blood problems.   Past surgery.   Other health problems.  RISKS AND COMPLICATIONS All surgeries can have risks. Some of these risks are:  A lot of bleeding.   Injury to surrounding organs.   Infection.   Blood clots of the leg, heart, or lung.   Problems with anesthesia.   Early menopause.  BEFORE THE PROCEDURE  Do not take aspirin or blood thinners for a week before surgery, or as directed by your caregiver.   Do not eat or drink anything after midnight the night before surgery, or as directed by your caregiver.   Let your caregiver know if you get a cold or other infectious problems before surgery.   If you are being admitted the day of surgery, you should be present 60 minutes before your procedure or as told by your caregiver.  PROCEDURE   An IV (intravenous) will be placed in your arm. You will be given a drug to make you sleep (anesthetic) during surgery. You may be given a shot in the spine (spinal anesthesia) that will numb your body from the waist down. This will keep you pain-free during surgery.   When you wake from surgery, you will have the IV and a long, narrow, hollow tube (urinary catheter) draining the bladder for 1 or 2 days after surgery. This will make passing your urine easier. It also helps by keeping your bladder empty during surgery.   After surgery, you will be taken to the recovery area where a nurse will watch and check  your progress. Once you wake up, stable and taking fluids well, without other problems, you will be allowed to return to your room. Usually you will remain in the hospital 3 to 5 days. You may be given an antibiotic during and after the surgery and when you go home. Pain medication will be ordered by your caregiver while you are in the hospital and when you go home.  HOME CARE INSTRUCTIONS  Healing will take time. You will have discomfort, tenderness, swelling, and bruising at the operative site for a couple of weeks. This is  normal and will get better as time goes on.   Only take over-the-counter or prescription medicines for pain, discomfort, or fever as directed by your caregiver.   Do not take aspirin. It can cause bleeding.   Do not drive when taking pain medication.   Follow your caregiver's advice regarding diet, exercise, lifting, driving, and general activities.   Resume your usual diet as directed and allowed.   Get plenty of rest and sleep.   Do not douche, use tampons, or have sexual intercourse until your caregiver gives you permission.   Change your bandages (dressings) as directed.   Take your temperature twice a day. Write it down.   Your caregiver may recommend showers instead of baths for a few weeks.   Do not drink alcohol until your caregiver gives you permission.   If you develop constipation, you may take a mild laxative with your caregiver's permission. Bran foods and drinking fluids helps with constipation problems.   Try to have someone home with you for a week or two to help with the household activities.   Make sure you and your family understands everything about your operation and recovery.   Do not sign any legal documents until you feel normal again.   Keep all your follow-up appointments as recommended by your caregiver.  SEEK MEDICAL CARE IF:   There is swelling, redness, or increasing pain in the wound area.   Pus is coming from the wound.   You notice a bad smell from the wound or surgical dressing.   You have pain, redness, and swelling from the intravenous site.   The wound is breaking open (the edges are not staying together).   You feel dizzy or feel like fainting.   You develop pain or bleeding when you urinate.   You develop diarrhea.   You develop nausea and vomiting.   You develop abnormal vaginal discharge.   You develop a rash.   You have any type of abnormal reaction or develop an allergy to your medication.   You need stronger pain  medication for your pain.  SEEK IMMEDIATE MEDICAL CARE IF:  You have a fever.   You develop abdominal pain.   You develop chest pain.   You develop shortness of breath.   You pass out.   You develop pain, swelling, or redness of your leg.   You develop heavy vaginal bleeding with or without blood clots.  Document Released: 04/01/2001 Document Revised: 06/18/2011 Document Reviewed: 02/17/2008 [facility] Patient Information 2012 [place], [place].

## 2011-08-14 NOTE — Progress Notes (Signed)
History:  50 yo G3P3003 here today for discussion of management of dysmenorrhea, fibroids and DUB.  Pertinent Gynecological History:  Menses: flow is excessive with use of 20 pads or tampons on heaviest days  Bleeding: dysfunctional uterine bleeding  Contraception: tubal ligation  DES exposure: unknown  Blood transfusions: none  Sexually transmitted diseases: no past history  Previous GYN Procedures: none  Last mammogram: normal   Last pap: normal Date: 07/17/11  Past Medical History   Diagnosis  Date   .  Hyperlipidemia    .  Anemia    .  Thyroid disease  hyperthyroidism   .  Morbid obesity    .  Morbid obesity    .  Hypertension      Pt states she "doesn't have HTN"    Past Surgical History   Procedure  Date   .  Cesarean section  12/07/79   .  Cesarean section  05/17/81   .  Cesarean section  09/06/89   .  Tubal ligation     Family History   Problem  Relation  Age of Onset   .  Hypertension  Father    .  Heart disease  Mother    .  Hypertension  Sister     Social History   Substance Use Topics   .  Smoking status:  Never Smoker   .  Smokeless tobacco:  Not on file   .  Alcohol Use:  No   Allergies: No Known Allergies  Prescriptions prior to admission   Medications Sig  Dispense  Refill   .  acetaminophen (TYLENOL) 325 MG tablet  Take 650 mg by mouth every 6 (six) hours as needed. For pain     .  ibuprofen (ADVIL,MOTRIN) 200 MG tablet  Take 400 mg by mouth every 6 (six) hours as needed.     .  ferrous sulfate 325 (65 FE) MG tablet  Take 325 mg by mouth daily with breakfast.     .  hydrochlorothiazide (HYDRODIURIL) 25 MG tablet  Take 25 mg by mouth daily.     .  valsartan (DIOVAN) 160 MG tablet  Take 160 mg by mouth daily.       Objective:  Physical Exam Blood pressure 170/85, pulse 76, temperature 97.7 F (36.5 C), temperature source Oral, height 5\' 5"  (1.651 m), weight 273 lb 1.6 oz (123.877 kg), last menstrual period 06/17/2011. Gen: NAD Abd: Soft/NT/ND,  obese Pelvic: Deferred  Labs and Imaging 07/19/11 TRANSABDOMINAL AND TRANSVAGINAL ULTRASOUND OF PELVIS  Uterus: The uterus measures 11.1 x 6.9 x 8.5 cm. Nodular myometrial echotexture with multiple focal nodules. The largest is present in the fundus and measures about 5.4 x 4 x 4.2 cm. These are consistent with multiple leiomyomas.Small Nabothian cysts of the cervix. Endometrium: Endometrial stripe thickness measures 4.4 mm. No abnormal endometrial fluid collections.  Right ovary: Right ovary measures 2.9 x 1.7 x 1.4 cm. Suggestion of small complex hemorrhagic cyst measuring about 1.4 cm diameter. Limited visualization and characterization.  Left ovary: Left ovary measures 3.4 x 1.7 x 1.7 cm. No abnormal adnexal masses demonstrated.  Other findings: No free fluid  IMPRESSION:  Multiple uterine leiomyomas. Normal endometrial stripe thickness without significant endometrial fluid. Probable hemorrhagic cyst on the right ovary. Left ovary normal. No free pelvic fluid collections.  07/17/11 Endometrium, biopsy - BENIGN SECRETORY ENDOMETRIUM, NO ATYPIA, HYPERPLASIA OR MALIGNANCY. - POLYPOID FRAGMENTS OF BENIGN ENDOCERVICAL MUCOSA WITH NO EVIDENCE OF DYSPLASIA OR MALIGNANCY.  Lab Results  Component Value Date   WBC 5.9 07/19/2011   HGB 9.6* 07/19/2011   HCT 30.9* 07/19/2011   MCV 85.8 07/19/2011   PLT 235 07/19/2011    Assessment & Plan:  Patient desires definitive management with hysterectomy.  I proposed doing a  robotic laparoscopic-assisted vaginal hysterectomy and bilateral salpingoophorectomy. The primary surgeon will be Nicholaus Bloom, MD and patient has met with Dr. Marice Potter and will have another discussion prior to surgery.  Patient agrees with this proposed surgery.  The risks of surgery were discussed in detail with the patient including but not limited to: bleeding which may require transfusion or reoperation; infection which may require antibiotics; injury to bowel, bladder, ureters or other  surrounding organs; need for additional procedures including laparotomy; thromboembolic phenomenon, incisional problems and other postoperative/anesthesia complications. She was advised that given her cesarean sections, the bladder may be adherent to the lower uterine segment and cervix so the cervix may be left in place, meaning she will need pap smear surveillance, and she is okay with this.  Likelihood of success in alleviating the patient's menorrhagia and fibroids is 100% after the hysterectomy is performed. Routine postoperative instructions will be reviewed with the patient and her family in detail after surgery.  She was told that she will be contacted by our surgical scheduler regarding the time and date of her surgery; routine preoperative instructions of having nothing to eat or drink after midnight on the day prior to surgery and also coming to the hospital 1 1/2 hours prior to her time of surgery were also emphasized.  She was told she may be called for a preoperative appointment about a week prior to surgery and will be given further preoperative instructions at that visit.   In the meantime, bleeding precautions were reviewed. She was e-prescribed diclofenac for pain, and also prescribed valsartan-HCTZ for her HTN; needs optimal control of her HTN prior to surgery.

## 2011-08-14 NOTE — Progress Notes (Signed)
Pt was taken off all meds even BP med by her PCP due to thyroid.

## 2011-09-10 ENCOUNTER — Ambulatory Visit (INDEPENDENT_AMBULATORY_CARE_PROVIDER_SITE_OTHER): Payer: BC Managed Care – PPO | Admitting: Obstetrics & Gynecology

## 2011-09-10 ENCOUNTER — Encounter: Payer: Self-pay | Admitting: Obstetrics & Gynecology

## 2011-09-10 VITALS — BP 126/66 | HR 84 | Temp 99.2°F | Ht 64.75 in | Wt 274.0 lb

## 2011-09-10 DIAGNOSIS — D259 Leiomyoma of uterus, unspecified: Secondary | ICD-10-CM

## 2011-09-10 DIAGNOSIS — D219 Benign neoplasm of connective and other soft tissue, unspecified: Secondary | ICD-10-CM

## 2011-09-10 DIAGNOSIS — D649 Anemia, unspecified: Secondary | ICD-10-CM

## 2011-09-10 DIAGNOSIS — N920 Excessive and frequent menstruation with regular cycle: Secondary | ICD-10-CM

## 2011-09-10 NOTE — Progress Notes (Signed)
  Subjective:    Patient ID: Brittney Ross, female    DOB: 1961-10-18, 50 y.o.   MRN: [id number]  HPI  Brittney Ross is already scheduled for a robotic hyst and BSO on January 15,2013. She is here for a BP follow up since Dr. Lynetta Mare started her on a antihypertensive last month.   Review of Systems     Objective:   Physical Exam        Assessment & Plan:  Symptomatic fibroids- for surgery in January.  She will continue her meds. I have strongly recommended that she drink a bottle of Mag citrate the day before surgery.

## 2011-09-25 ENCOUNTER — Encounter (HOSPITAL_COMMUNITY): Admission: RE | Payer: Self-pay | Source: Ambulatory Visit

## 2011-09-25 ENCOUNTER — Ambulatory Visit (HOSPITAL_COMMUNITY)
Admission: RE | Admit: 2011-09-25 | Payer: BC Managed Care – PPO | Source: Ambulatory Visit | Admitting: Obstetrics & Gynecology

## 2011-09-25 SURGERY — ROBOTIC ASSISTED TOTAL HYSTERECTOMY WITH BILATERAL SALPINGO OOPHORECTOMY
Anesthesia: General | Laterality: Bilateral

## 2011-10-22 ENCOUNTER — Encounter (HOSPITAL_COMMUNITY): Payer: Self-pay

## 2011-10-30 ENCOUNTER — Encounter (HOSPITAL_COMMUNITY)
Admission: RE | Admit: 2011-10-30 | Discharge: 2011-10-30 | Disposition: A | Discharge status: Home / Self Care | Payer: BC Managed Care – PPO | Source: Ambulatory Visit | Attending: Obstetrics & Gynecology | Admitting: Obstetrics & Gynecology

## 2011-10-30 ENCOUNTER — Encounter (HOSPITAL_COMMUNITY): Payer: Self-pay

## 2011-10-30 ENCOUNTER — Other Ambulatory Visit: Payer: Self-pay

## 2011-10-30 LAB — CBC
Hemoglobin: 9.8 g/dL — ABNORMAL LOW (ref 12.0–15.0)
MCH: 26.6 pg (ref 26.0–34.0)
MCHC: 30.4 g/dL (ref 30.0–36.0)
RDW: 16.6 % — ABNORMAL HIGH (ref 11.5–15.5)

## 2011-10-30 LAB — SURGICAL PCR SCREEN: Staphylococcus aureus: NEGATIVE

## 2011-10-30 LAB — BASIC METABOLIC PANEL
BUN: 13 mg/dL (ref 6–23)
Calcium: 8.8 mg/dL (ref 8.4–10.5)
GFR calc non Af Amer: >90 mL/min (ref >90)
Glucose, Bld: 96 mg/dL (ref 70–99)
Sodium: 136 mEq/L (ref 135–145)

## 2011-10-30 NOTE — Patient Instructions (Signed)
   Your procedure is scheduled on: Tuesday Jan. 15 at 0730am  Enter through the Main Entrance of [facility] at: 6:00am Pick up the phone at the desk and dial [phone] and inform us of your arrival.  Please call this number if you have any problems the morning of surgery: [phone]  Remember: Do not eat food after midnight: Sunday Do not drink clear liquids after:Monday Take these medicines the morning of surgery with a SIP OF WATER:  Voltaren Diovan  Do not wear jewelry, make-up, or FINGER nail polish Do not wear lotions, powders, perfumes or deodorant. Do not shave 48 hours prior to surgery. Do not bring valuables to the hospital.  Leave suitcase in the car. After Surgery it may be brought to your room. For patients being admitted to the hospital, checkout time is 11:00am the day of discharge.  Patients discharged on the day of surgery will not be allowed to drive home.    Remember to use your hibiclens as instructed.Please shower with 1/2 bottle the evening before your surgery and the other 1/2 bottle the morning of surgery.

## 2011-10-30 NOTE — Anesthesia Preprocedure Evaluation (Addendum)
Anesthesia Evaluation  Patient identified by MRN, date of birth, ID band Patient awake    Reviewed: Allergy & Precautions, H&P   Airway Mallampati: III TM Distance: >3 FB Neck ROM: full    Dental No notable dental hx. (+) Teeth Intact   Pulmonary neg pulmonary ROS,  clear to auscultation  Pulmonary exam normal       Cardiovascular hypertension, On Medications neg cardio ROS regular Normal    Neuro/Psych  Headaches,    GI/Hepatic negative GI ROS, Neg liver ROS,   Endo/Other  Negative Endocrine ROSHyperthyroidism Morbid obesityHyperlipidemia Hx/o Grave's Disease S/P RAI Rx  Renal/GU negative Renal ROS  Genitourinary negative   Musculoskeletal   Abdominal Normal abdominal exam  (+)   Peds  Hematology negative hematology ROS (+)   Anesthesia Other Findings   Reproductive/Obstetrics negative OB ROS                           Anesthesia Physical Anesthesia Plan  ASA: III  Anesthesia Plan:    Post-op Pain Management:    Induction:   Airway Management Planned:   Additional Equipment:   Intra-op Plan:   Post-operative Plan:   Informed Consent: I have reviewed the patients History and Physical, chart, labs and discussed the procedure including the risks, benefits and alternatives for the proposed anesthesia with the patient or authorized representative who has indicated his/her understanding and acceptance.   Dental Advisory Given  Plan Discussed with: Anesthesiologist and Surgeon  Anesthesia Plan Comments:         Anesthesia Quick Evaluation

## 2011-11-04 ENCOUNTER — Ambulatory Visit (HOSPITAL_COMMUNITY)
Admission: RE | Admit: 2011-11-04 | Discharge: 2011-11-04 | Disposition: A | Discharge status: Home / Self Care | Payer: BC Managed Care – PPO | Source: Ambulatory Visit | Attending: Obstetrics & Gynecology | Admitting: Obstetrics & Gynecology

## 2011-11-04 ENCOUNTER — Encounter (HOSPITAL_COMMUNITY): Payer: Self-pay | Admitting: Anesthesiology

## 2011-11-04 ENCOUNTER — Encounter (HOSPITAL_COMMUNITY): Payer: Self-pay | Admitting: General Practice

## 2011-11-04 ENCOUNTER — Encounter (HOSPITAL_COMMUNITY)
Admission: RE | Disposition: A | Discharge status: Home / Self Care | Payer: Self-pay | Source: Ambulatory Visit | Attending: Obstetrics & Gynecology

## 2011-11-04 ENCOUNTER — Encounter (HOSPITAL_COMMUNITY): Payer: Self-pay | Admitting: *Deleted

## 2011-11-04 ENCOUNTER — Other Ambulatory Visit: Payer: Self-pay | Admitting: Obstetrics & Gynecology

## 2011-11-04 ENCOUNTER — Ambulatory Visit: Admit: 2011-11-04 | Payer: Self-pay | Admitting: Obstetrics & Gynecology

## 2011-11-04 ENCOUNTER — Ambulatory Visit (HOSPITAL_COMMUNITY): Payer: BC Managed Care – PPO | Admitting: Anesthesiology

## 2011-11-04 DIAGNOSIS — E669 Obesity, unspecified: Secondary | ICD-10-CM | POA: Insufficient documentation

## 2011-11-04 DIAGNOSIS — N938 Other specified abnormal uterine and vaginal bleeding: Secondary | ICD-10-CM

## 2011-11-04 DIAGNOSIS — N949 Unspecified condition associated with female genital organs and menstrual cycle: Secondary | ICD-10-CM

## 2011-11-04 DIAGNOSIS — N920 Excessive and frequent menstruation with regular cycle: Secondary | ICD-10-CM | POA: Insufficient documentation

## 2011-11-04 DIAGNOSIS — N946 Dysmenorrhea, unspecified: Secondary | ICD-10-CM

## 2011-11-04 DIAGNOSIS — Z01818 Encounter for other preprocedural examination: Secondary | ICD-10-CM | POA: Insufficient documentation

## 2011-11-04 DIAGNOSIS — D649 Anemia, unspecified: Secondary | ICD-10-CM

## 2011-11-04 DIAGNOSIS — Z01812 Encounter for preprocedural laboratory examination: Secondary | ICD-10-CM | POA: Insufficient documentation

## 2011-11-04 SURGERY — ROBOTIC ASSISTED TOTAL HYSTERECTOMY WITH BILATERAL SALPINGO OOPHORECTOMY
Anesthesia: Choice | Site: Abdomen

## 2011-11-04 SURGERY — ROBOTIC ASSISTED TOTAL HYSTERECTOMY WITH BILATERAL SALPINGO OOPHORECTOMY
Anesthesia: General | Site: Abdomen | Wound class: Clean Contaminated

## 2011-11-04 MED ORDER — ACETAMINOPHEN 10 MG/ML IV SOLN
INTRAVENOUS | Status: DC | PRN
  Administered 2011-11-04: 1000 mg via INTRAVENOUS

## 2011-11-04 MED ORDER — ARTIFICIAL TEARS OP OINT
TOPICAL_OINTMENT | OPHTHALMIC | Status: DC | PRN
  Administered 2011-11-04: 1 via OPHTHALMIC

## 2011-11-04 MED ORDER — ACETAMINOPHEN 10 MG/ML IV SOLN
1000.0000 mg | Freq: Four times a day (QID) | INTRAVENOUS | Status: DC
  Filled 2011-11-04 (×4): qty 100

## 2011-11-04 MED ORDER — PROMETHAZINE HCL 25 MG/ML IJ SOLN: 12.5000 mg | INTRAMUSCULAR | Status: DC | PRN

## 2011-11-04 MED ORDER — IBUPROFEN 800 MG PO TABS: 800.0000 mg | ORAL_TABLET | Freq: Three times a day (TID) | ORAL | Status: DC | PRN

## 2011-11-04 MED ORDER — EPHEDRINE SULFATE 50 MG/ML IJ SOLN
INTRAMUSCULAR | Status: DC | PRN
  Administered 2011-11-04 (×2): 10 mg via INTRAVENOUS
  Administered 2011-11-04: 5 mg via INTRAVENOUS
  Administered 2011-11-04: 15 mg via INTRAVENOUS

## 2011-11-04 MED ORDER — BUPIVACAINE HCL (PF) 0.5 % IJ SOLN
INTRAMUSCULAR | Status: AC
  Filled 2011-11-04: qty 30

## 2011-11-04 MED ORDER — MIDAZOLAM HCL 5 MG/5ML IJ SOLN
INTRAMUSCULAR | Status: DC | PRN
  Administered 2011-11-04: 2 mg via INTRAVENOUS

## 2011-11-04 MED ORDER — PROMETHAZINE HCL 25 MG/ML IJ SOLN: 6.2500 mg | INTRAMUSCULAR | Status: DC | PRN

## 2011-11-04 MED ORDER — LIDOCAINE HCL (CARDIAC) 20 MG/ML IV SOLN
INTRAVENOUS | Status: DC | PRN
  Administered 2011-11-04: 100 mg via INTRAVENOUS

## 2011-11-04 MED ORDER — DEXAMETHASONE SODIUM PHOSPHATE 10 MG/ML IJ SOLN
INTRAMUSCULAR | Status: DC | PRN
  Administered 2011-11-04: 10 mg via INTRAVENOUS

## 2011-11-04 MED ORDER — SIMETHICONE 80 MG PO CHEW: 80.0000 mg | CHEWABLE_TABLET | Freq: Four times a day (QID) | ORAL | Status: DC | PRN

## 2011-11-04 MED ORDER — NEOSTIGMINE METHYLSULFATE 1 MG/ML IJ SOLN
INTRAMUSCULAR | Status: DC | PRN
  Administered 2011-11-04: 5 mg via INTRAVENOUS

## 2011-11-04 MED ORDER — KETOROLAC TROMETHAMINE 30 MG/ML IJ SOLN
30.0000 mg | Freq: Once | INTRAMUSCULAR | Status: AC
  Administered 2011-11-04: 30 mg via INTRAVENOUS

## 2011-11-04 MED ORDER — LACTATED RINGERS IV SOLN
INTRAVENOUS | Status: DC
  Administered 2011-11-04 (×3): via INTRAVENOUS

## 2011-11-04 MED ORDER — INDIGOTINDISULFONATE SODIUM 8 MG/ML IJ SOLN
INTRAMUSCULAR | Status: DC | PRN
  Administered 2011-11-04: 5 mL via INTRAVENOUS

## 2011-11-04 MED ORDER — FENTANYL CITRATE 0.05 MG/ML IJ SOLN
INTRAMUSCULAR | Status: DC | PRN
  Administered 2011-11-04 (×2): 50 ug via INTRAVENOUS
  Administered 2011-11-04: 100 ug via INTRAVENOUS
  Administered 2011-11-04: 50 ug via INTRAVENOUS

## 2011-11-04 MED ORDER — ACETAMINOPHEN 325 MG PO TABS: 325.0000 mg | ORAL_TABLET | ORAL | Status: DC | PRN

## 2011-11-04 MED ORDER — LACTATED RINGERS IR SOLN
Status: DC | PRN
  Administered 2011-11-04: 3000 mL

## 2011-11-04 MED ORDER — BUPIVACAINE HCL (PF) 0.25 % IJ SOLN
INTRAMUSCULAR | Status: AC
  Filled 2011-11-04: qty 30

## 2011-11-04 MED ORDER — SCOPOLAMINE 1 MG/3DAYS TD PT72: 1.0000 | MEDICATED_PATCH | Freq: Once | TRANSDERMAL | Status: DC

## 2011-11-04 MED ORDER — CEFAZOLIN SODIUM 1-5 GM-% IV SOLN
INTRAVENOUS | Status: AC
  Administered 2011-11-04: 1 g via INTRAVENOUS
  Filled 2011-11-04: qty 50

## 2011-11-04 MED ORDER — ROPIVACAINE HCL 5 MG/ML IJ SOLN
INTRAMUSCULAR | Status: DC | PRN
  Administered 2011-11-04 (×2): 30 mL

## 2011-11-04 MED ORDER — FENTANYL CITRATE 0.05 MG/ML IJ SOLN: 25.0000 ug | INTRAMUSCULAR | Status: DC | PRN

## 2011-11-04 MED ORDER — ROCURONIUM BROMIDE 100 MG/10ML IV SOLN
INTRAVENOUS | Status: DC | PRN
  Administered 2011-11-04: 10 mg via INTRAVENOUS
  Administered 2011-11-04: 40 mg via INTRAVENOUS
  Administered 2011-11-04 (×4): 10 mg via INTRAVENOUS

## 2011-11-04 MED ORDER — DEXTROSE IN LACTATED RINGERS 5 % IV SOLN
INTRAVENOUS | Status: DC
  Administered 2011-11-04: 17:00:00 via INTRAVENOUS

## 2011-11-04 MED ORDER — STERILE WATER FOR IRRIGATION IR SOLN
Status: DC | PRN
  Administered 2011-11-04: 1000 mL via INTRAVESICAL

## 2011-11-04 MED ORDER — MEPERIDINE HCL 25 MG/ML IJ SOLN: 6.2500 mg | INTRAMUSCULAR | Status: DC | PRN

## 2011-11-04 MED ORDER — PROPOFOL 10 MG/ML IV EMUL
INTRAVENOUS | Status: DC | PRN
  Administered 2011-11-04: 200 mg via INTRAVENOUS

## 2011-11-04 MED ORDER — IBUPROFEN 800 MG PO TABS: 800.0000 mg | ORAL_TABLET | Freq: Three times a day (TID) | ORAL | Status: AC | PRN

## 2011-11-04 MED ORDER — KETOROLAC TROMETHAMINE 30 MG/ML IJ SOLN
INTRAMUSCULAR | Status: AC
  Administered 2011-11-04: 30 mg via INTRAVENOUS
  Filled 2011-11-04: qty 1

## 2011-11-04 MED ORDER — DEXTROSE IN LACTATED RINGERS 5 % IV SOLN: INTRAVENOUS | Status: DC

## 2011-11-04 MED ORDER — ONDANSETRON HCL 4 MG/2ML IJ SOLN
INTRAMUSCULAR | Status: DC | PRN
  Administered 2011-11-04: 4 mg via INTRAVENOUS

## 2011-11-04 MED ORDER — OXYCODONE-ACETAMINOPHEN 5-325 MG PO TABS: 1.0000 | ORAL_TABLET | ORAL | Status: AC | PRN

## 2011-11-04 MED ORDER — GLYCOPYRROLATE 0.2 MG/ML IJ SOLN
INTRAMUSCULAR | Status: DC | PRN
  Administered 2011-11-04: 1 mg via INTRAVENOUS
  Administered 2011-11-04: 0.1 mg via INTRAVENOUS

## 2011-11-04 MED ORDER — OXYCODONE-ACETAMINOPHEN 5-325 MG PO TABS: 1.0000 | ORAL_TABLET | ORAL | Status: DC | PRN

## 2011-11-04 MED ORDER — CEFAZOLIN SODIUM 1-5 GM-% IV SOLN: 1.0000 g | INTRAVENOUS | Status: DC

## 2011-11-04 SURGICAL SUPPLY — 64 items
ADH SKN CLS APL DERMABOND .7 (GAUZE/BANDAGES/DRESSINGS) ×2
APL SKNCLS STERI-STRIP NONHPOA (GAUZE/BANDAGES/DRESSINGS) ×2
BAG URINE DRAINAGE (UROLOGICAL SUPPLIES) ×3 IMPLANT
BARRIER ADHS 3X4 INTERCEED (GAUZE/BANDAGES/DRESSINGS) ×3 IMPLANT
BENZOIN TINCTURE PRP APPL 2/3 (GAUZE/BANDAGES/DRESSINGS) ×3 IMPLANT
BRR ADH 4X3 ABS CNTRL BYND (GAUZE/BANDAGES/DRESSINGS) ×2
CABLE HIGH FREQUENCY MONO STRZ (ELECTRODE) ×3 IMPLANT
CATH FOLEY 3WAY  5CC 16FR (CATHETERS) ×1
CATH FOLEY 3WAY 5CC 16FR (CATHETERS) ×2 IMPLANT
CHLORAPREP W/TINT 26ML (MISCELLANEOUS) ×3 IMPLANT
CLOTH BEACON ORANGE TIMEOUT ST (SAFETY) ×3 IMPLANT
CONT PATH 16OZ SNAP LID 3702 (MISCELLANEOUS) ×3 IMPLANT
COVER MAYO STAND STRL (DRAPES) ×3 IMPLANT
COVER TABLE BACK 60X90 (DRAPES) ×6 IMPLANT
COVER TIP SHEARS 8 DVNC (MISCELLANEOUS) ×2 IMPLANT
COVER TIP SHEARS 8MM DA VINCI (MISCELLANEOUS) ×1
DECANTER SPIKE VIAL GLASS SM (MISCELLANEOUS) ×3 IMPLANT
DERMABOND ADVANCED (GAUZE/BANDAGES/DRESSINGS) ×1
DERMABOND ADVANCED .7 DNX12 (GAUZE/BANDAGES/DRESSINGS) ×2 IMPLANT
DRAPE HUG U DISPOSABLE (DRAPE) ×3 IMPLANT
DRAPE LG THREE QUARTER DISP (DRAPES) ×6 IMPLANT
DRAPE MONITOR DA VINCI (DRAPE) IMPLANT
DRAPE WARM FLUID 44X44 (DRAPE) ×3 IMPLANT
ELECT REM PT RETURN 9FT ADLT (ELECTROSURGICAL) ×3
ELECTRODE REM PT RTRN 9FT ADLT (ELECTROSURGICAL) ×2 IMPLANT
EVACUATOR SMOKE 8.L (FILTER) ×3 IMPLANT
GAUZE VASELINE 3X9 (GAUZE/BANDAGES/DRESSINGS) IMPLANT
GLOVE BIO SURGEON STRL SZ 6.5 (GLOVE) ×9 IMPLANT
GLOVE ECLIPSE 6.5 STRL STRAW (GLOVE) ×9 IMPLANT
GOWN STRL REIN XL XLG (GOWN DISPOSABLE) ×18 IMPLANT
KIT ACCESSORY DA VINCI DISP (KITS) ×1
KIT ACCESSORY DVNC DISP (KITS) ×2 IMPLANT
KIT DISP ACCESSORY 4 ARM (KITS) IMPLANT
NDL INSUFFLATION 14GA 120MM (NEEDLE) ×2 IMPLANT
NDL SPNL 18GX3.5 QUINCKE PK (NEEDLE) IMPLANT
NEEDLE INSUFFLATION 14GA 120MM (NEEDLE) ×3 IMPLANT
NEEDLE SPNL 18GX3.5 QUINCKE PK (NEEDLE) ×3 IMPLANT
NS IRRIG 1000ML POUR BTL (IV SOLUTION) ×9 IMPLANT
OCCLUDER COLPOPNEUMO (BALLOONS) ×6 IMPLANT
PACK LAVH (CUSTOM PROCEDURE TRAY) ×3 IMPLANT
PAD PREP 24X48 CUFFED NSTRL (MISCELLANEOUS) ×6 IMPLANT
PLUG CATH AND CAP STER (CATHETERS) ×3 IMPLANT
POSITIONER SURGICAL ARM (MISCELLANEOUS) ×6 IMPLANT
SET IRRIG TUBING LAPAROSCOPIC (IRRIGATION / IRRIGATOR) ×3 IMPLANT
SOLUTION ELECTROLUBE (MISCELLANEOUS) ×3 IMPLANT
STRIP CLOSURE SKIN 1/2X4 (GAUZE/BANDAGES/DRESSINGS) ×3 IMPLANT
SUT VIC AB 0 CT1 27 (SUTURE) ×15
SUT VIC AB 0 CT1 27XBRD ANBCTR (SUTURE) ×10 IMPLANT
SUT VIC AB 0 CT1 27XBRD ANTBC (SUTURE) IMPLANT
SUT VIC AB 2-0 CT2 27 (SUTURE) IMPLANT
SUT VICRYL 0 UR6 27IN ABS (SUTURE) ×6 IMPLANT
SUT VICRYL RAPIDE 4/0 PS 2 (SUTURE) ×6 IMPLANT
SYR 50ML LL SCALE MARK (SYRINGE) ×3 IMPLANT
TIP UTERINE 5.1X6CM LAV DISP (MISCELLANEOUS) IMPLANT
TIP UTERINE 6.7X10CM GRN DISP (MISCELLANEOUS) ×1 IMPLANT
TIP UTERINE 6.7X6CM WHT DISP (MISCELLANEOUS) IMPLANT
TIP UTERINE 6.7X8CM BLUE DISP (MISCELLANEOUS) IMPLANT
TOWEL OR 17X24 6PK STRL BLUE (TOWEL DISPOSABLE) ×6 IMPLANT
TROCAR DISP BLADELESS 8 DVNC (TROCAR) ×2 IMPLANT
TROCAR DISP BLADELESS 8MM (TROCAR) ×1
TROCAR XCEL 12X100 BLDLESS (ENDOMECHANICALS) ×3 IMPLANT
TROCAR Z-THREAD 12X150 (TROCAR) ×3 IMPLANT
TROCAR Z-THREAD BLADED 12X100M (TROCAR) IMPLANT
TUBING FILTER THERMOFLATOR (ELECTROSURGICAL) ×3 IMPLANT

## 2011-11-04 NOTE — Op Note (Addendum)
11/04/2011  11:51 AM  PATIENT:  Brittney Ross  51 y.o. female  PRE-OPERATIVE DIAGNOSIS:  Dysfunctional Uterine Bleeding;  Fibroids; Chronic Pelvic Pain  POST-OPERATIVE DIAGNOSIS:  Dysfunctional Uterine Bleeding; Fibroids; Chronic Pelvic Pain; Endometriosis; Pelvic Adhesions  PROCEDURE:  Procedure(s): ROBOTIC ASSISTED TOTAL HYSTERECTOMY WITH BILATERAL SALPINGO OOPHERECTOMY ROBOTIC ASSISTED LAPAROSCOPIC LYSIS OF ADHESION CYSTOSCOPY  SURGEON:  Nicholaus Bloom, MD  PHYSICIAN ASSISTANT:   ASSISTANTS: Jonette Eva, MD   ANESTHESIA:   general  EBL:  Total I/O In: 2400 [I.V.:2400] Out: 625 [Urine:325; Blood:300]  BLOOD ADMINISTERED:none  DRAINS: none   LOCAL MEDICATIONS USED:  OTHER ropivicaine 60 cc  SPECIMEN:  Source of Specimen:  uterus, tubes, ovaries  DISPOSITION OF SPECIMEN:  PATHOLOGY  COUNTS:  YES  TOURNIQUET:  * No tourniquets in log *  DICTATION: .Dragon Dictation  PLAN OF CARE: outpatient with extended recovery  PATIENT DISPOSITION:  PACU - hemodynamically stable.   Delay start of Pharmacological VTE agent (>24hrs) due to surgical blood loss or risk of bleeding:  no   The risks, benefits, and alternatives of surgery were explained, understood, accepted. All questions were answered and consents were signed. I specifically told her that robotic approach has an increased incidence of vaginal cuff dehiscence.I also coated her normal risks associated with the surgery including, but not limited to, infection, bleeding, damage to bowel, bladder, ureters. In the operating room she was placed in the dorsal lithotomy position and general anesthesia was applied without complication. Her abdomen and vagina were prepped and draped in the usual sterile fashion after extensive careful tucking measures were performed. A Rumi uterine manipulator was placed. Please note that the uterus sounded to 10 cm. The cervix was dilated to accommodate a roomy retractor. It was sewn into place at the  cervix. I did a paravaginal cuff block with the dilute ropivacaine (10 cc). I placed a Foley catheter, and it drained clear urine throughout the case. Please note that prior to starting the case we did a timeout and I did a bimanual exam which revealed a 14 week size mobile uterus and nonenlarged adnexa. Gloves were changed and I turned my attention to the abdomen. A vertical supraumbilical incision was made and a varies needle was placed intraperitoneally. Low-flow CO2 was used to insufflate the abdomen to approximately 5-1/2 L. Patient abdominal pressure was always less than 15. Laparoscopy confirmed correct placement. She was placed in steep Trendelenburg position. The pelvis was inspected the uterus was enlarged consistent with a fibroid seen on ultrasound. Her adnexa showed evidence of endometriosis. There were bowel adhesions to the right adnexa. We then placed a 12 mm system port in the right lower quadrant and two 8 mm ports approximately 12 cm lateral to the umbilicus. Prior to making the incisions each incision site was injected with 10 mL of dilute ropivacaine. The ports were placed in the robot was docked. I then proceeded to the robotic console. The ureters position were were noted bilaterally throughout the case. The uteroovarian ligaments were cauterized using the PK device. The round ligaments were also identified and ligated with the PK device. A bladder flap was created anteriorly, taking care to avoid damage to the bladder. There was some scarring from her previous cesarean sections. The uterine vessels were cauterized and cut.  An anterior colpotomy was made. This incision was carried around circumferentially. We then removed the uterus through the vagina. I then went back and removed the adhesions from the bowel to the right adnexa. Both adnexa were  then excised after cauterizing and cutting the infundibulopelvic ligaments. The ovaries were removed through the vagina.The vaginal cuff was closed  with a total of four 0 Vicryl figure-of-eight sutures. Excellent hemostasis was noted at all pedicles and the vaginal cuff. Both ureters were noted to be functioning normally and of normal caliber. 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 We then closed the 12 mm system port site with a Endoloop closure.The robot was undocked and all ports were removed.The fascia at the supraumbilical camera port incision was closed with 0 Vicryl and the subcutaneous tissue at all sites was closed with 4-0 Vicryl suture. The instrumen,t sponge, and needle counts were correct. She tolerated the procedure well. She was extubated and taken to recovery in stable condition.

## 2011-11-04 NOTE — Anesthesia Procedure Notes (Signed)
Procedure Name: Intubation Date/Time: 11/04/2011 7:55 AM Performed by: Karleen Dolphin Pre-anesthesia Checklist: Suction available, Timeout performed, Emergency Drugs available, Patient identified and Patient being monitored Patient Re-evaluated:Patient Re-evaluated prior to inductionOxygen Delivery Method: Circle System Utilized Preoxygenation: Pre-oxygenation with 100% oxygen Intubation Type: IV induction and Cricoid Pressure applied Ventilation: Mask ventilation with difficulty Laryngoscope Size: Mac and 4 Grade View: Grade II Tube type: Oral Tube size: 7.0 mm Airway Equipment and Method: patient positioned with wedge pillow and stylet Secured at: 22 cm Tube secured with: Tape Dental Injury: Teeth and Oropharynx as per pre-operative assessment  Difficulty Due To: Difficulty was anticipated and Difficult Airway- due to large tongue

## 2011-11-04 NOTE — Progress Notes (Signed)
DC IV, reviewed DC instructions med rec and scripts. Pt states complete understanding.  With no complaints. Pt wheeled to main entrance by Thayer Ohm, Charity fundraiser.

## 2011-11-04 NOTE — Transfer of Care (Signed)
Immediate Anesthesia Transfer of Care Note  Patient: Brittney Ross  Procedure(s) Performed:  ROBOTIC ASSISTED TOTAL HYSTERECTOMY WITH BILATERAL SALPINGO OOPHERECTOMY; ROBOTIC ASSISTED LAPAROSCOPIC LYSIS OF ADHESION  Patient Location: PACU  Anesthesia Type: General  Level of Consciousness: awake, alert  and oriented  Airway & Oxygen Therapy: Patient Spontanous Breathing and Patient connected to face mask oxygen  Post-op Assessment: Report given to PACU RN and Post -op Vital signs reviewed and stable  Post vital signs: Reviewed and stable Filed Vitals:   11/04/11 1156  BP:   Pulse: 91  Temp:   Resp: 29    Complications: No apparent anesthesia complications

## 2011-11-04 NOTE — H&P (Signed)
Brittney Ross is an 51 y.o. female. She has a long history of dysmenorrhea and menorrhagia resulting in anemia. Her ultrasound shows multiple fibroids. She would like her uterus, tubes, and ovaries removed.  Pertinent Gynecological History: Menses: flow is excessive with use of 7 pads or tampons on heaviest days Bleeding: dysfunctional uterine bleeding Contraception: PPS DES exposure: unknown Blood transfusions: none Sexually transmitted diseases: no past history Previous GYN Procedures: none  Last mammogram: normal Date: 2011 Last pap: normal Date: 2012 OB History: G3, P3 ( 3 cesareans)   Menstrual History: Menarche age: 53 No LMP recorded.    Past Medical History  Diagnosis Date  . Hyperlipidemia   . Anemia   . Thyroid disease hyperthyroidism  . Morbid obesity   . Morbid obesity   . Hypertension     Pt states she "doesn't have HTN"    Past Surgical History  Procedure Date  . Cesarean section 12/07/79  . Cesarean section 05/17/81  . Cesarean section 09/06/89  . Tubal ligation     Family History  Problem Relation Age of Onset  . Hypertension Father   . Heart disease Mother   . Hypertension Sister     Social History:  reports that she has never smoked. She does not have any smokeless tobacco history on file. She reports that she does not drink alcohol or use illicit drugs.  Allergies: No Known Allergies  Prescriptions prior to admission  Medication Sig Dispense Refill  . acetaminophen (TYLENOL) 325 MG tablet Take 650 mg by mouth every 6 (six) hours as needed. For pain       . diclofenac (VOLTAREN) 75 MG EC tablet Take 1 tablet (75 mg total) by mouth 2 (two) times daily with a meal.  60 tablet  2  . valsartan-hydrochlorothiazide (DIOVAN HCT) 160-25 MG per tablet Take 1 tablet by mouth daily.  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 rate 18, SpO2 100.00%. Physical Exam Heart- rrr Lungs- CTAB Abd- obese  Results for orders placed during the hospital encounter of 11/04/11 (from the past 24 hour(s))  PREGNANCY, URINE     Status: Normal   Collection Time   11/04/11  6:00 AM      Component Value Range   Preg Test, Ur NEGATIVE      No results found.  Assessment/Plan: DUB, fibroids, anemia, obesity- We have discussed options. She prefers a robotic approach. She understands risks of surgery in general and those specific to robotic surgery (steep Trendelenburg, cuff dehiscence).  Milburn Freeney C. 11/04/2011, 6:59 AM

## 2011-11-04 NOTE — Anesthesia Postprocedure Evaluation (Signed)
  Anesthesia Post-op Note  Patient: Brittney Ross  Procedure(s) Performed:  ROBOTIC ASSISTED TOTAL HYSTERECTOMY WITH BILATERAL SALPINGO OOPHERECTOMY; ROBOTIC ASSISTED LAPAROSCOPIC LYSIS OF ADHESION  Patient Location: PACU  Anesthesia Type: General  Level of Consciousness: awake, alert  and oriented  Airway and Oxygen Therapy: Patient Spontanous Breathing  Post-op Pain: none  Post-op Assessment: Post-op Vital signs reviewed, Patient's Cardiovascular Status Stable, Respiratory Function Stable, Patent Airway, No signs of Nausea or vomiting and Pain level controlled  Post-op Vital Signs: Reviewed and stable  Complications: No apparent anesthesia complications

## 2011-11-17 ENCOUNTER — Telehealth: Payer: Self-pay | Admitting: *Deleted

## 2011-11-17 NOTE — Telephone Encounter (Signed)
Pt called stating wants call back. States is having problems.

## 2011-11-17 NOTE — Telephone Encounter (Signed)
Telephoned pt at home #. Pt states is just feeling weak no other symptoms. No pain. Pt states only taking ibuprofen when needed. Pt was having problems with constipation but that is resolved. If patient continues to have problems will schedule office visit. Pt voiced understanding.

## 2011-11-19 ENCOUNTER — Other Ambulatory Visit: Payer: Self-pay | Admitting: *Deleted

## 2011-11-19 ENCOUNTER — Telehealth: Payer: Self-pay | Admitting: *Deleted

## 2011-11-19 MED ORDER — ZOLPIDEM TARTRATE ER 6.25 MG PO TBCR: 6.2500 mg | EXTENDED_RELEASE_TABLET | Freq: Every evening | ORAL | Status: AC | PRN

## 2011-11-19 MED ORDER — ZOLPIDEM TARTRATE ER 6.25 MG PO TBCR: 6.2500 mg | EXTENDED_RELEASE_TABLET | Freq: Every evening | ORAL | Status: DC | PRN

## 2011-11-19 NOTE — Telephone Encounter (Signed)
Pt left message stating that she is having trouble sleeping. I returned pt's call and confirmed that her sleep difficulty is not related to pain or other problem. Pt states she just cannot fall asleep or stay asleep. She had robotic TAH/BSO on 11/04/11. She states she has never had sleep difficulties in the past. I told that I will speak to Dr. Marice Potter this afternoon and then call her back. Pt voiced understanding.

## 2011-11-19 NOTE — Telephone Encounter (Signed)
Called pt and informed her that a Rx for sleep med has been sent to her pharmacy. Pt was advised that this will be the only Rx that will be given for sleep med. Pt voiced understanding.

## 2011-12-10 ENCOUNTER — Encounter: Payer: Self-pay | Admitting: Obstetrics & Gynecology

## 2011-12-10 ENCOUNTER — Ambulatory Visit (INDEPENDENT_AMBULATORY_CARE_PROVIDER_SITE_OTHER): Payer: BC Managed Care – PPO | Admitting: Obstetrics & Gynecology

## 2011-12-10 VITALS — BP 132/68 | HR 93 | Temp 98.5°F | Wt 262.9 lb

## 2011-12-10 DIAGNOSIS — Z09 Encounter for follow-up examination after completed treatment for conditions other than malignant neoplasm: Secondary | ICD-10-CM

## 2011-12-10 DIAGNOSIS — Z1231 Encounter for screening mammogram for malignant neoplasm of breast: Secondary | ICD-10-CM

## 2011-12-10 DIAGNOSIS — Z9889 Other specified postprocedural states: Secondary | ICD-10-CM

## 2011-12-10 DIAGNOSIS — Z0000 Encounter for general adult medical examination without abnormal findings: Secondary | ICD-10-CM

## 2011-12-10 NOTE — Progress Notes (Signed)
  Subjective:    Patient ID: Brittney Ross, female    DOB: 08-05-1961, 51 y.o.   MRN: [id number]  HPI  Brittney Ross is now 5 weeks post op status post RATH/BSO/cystoscopy. She has no complaints and wants to return to work (for [employer]) on March 8. She has not had sex yet since surgery and her truck driving husband won't be home again until March 8th.    Review of Systems     Objective:   Physical Exam  Well-healed incisions Vaginal cuff healed well Bimanual exam normal     Assessment & Plan:  Post op doing well I will schedule a mammogram.

## 2011-12-16 ENCOUNTER — Telehealth: Payer: Self-pay | Admitting: *Deleted

## 2011-12-17 NOTE — Telephone Encounter (Signed)
Pt was notified yesterday that her Disability papers have been completed and faxed. Pt voiced understanding.

## 2012-01-05 ENCOUNTER — Ambulatory Visit (HOSPITAL_COMMUNITY)
Admission: RE | Admit: 2012-01-05 | Discharge: 2012-01-05 | Disposition: A | Discharge status: Home / Self Care | Payer: BC Managed Care – PPO | Source: Ambulatory Visit | Attending: Obstetrics & Gynecology | Admitting: Obstetrics & Gynecology

## 2012-01-05 DIAGNOSIS — Z1231 Encounter for screening mammogram for malignant neoplasm of breast: Secondary | ICD-10-CM | POA: Insufficient documentation

## 2012-01-19 ENCOUNTER — Telehealth: Payer: Self-pay | Admitting: *Deleted

## 2012-01-19 NOTE — Telephone Encounter (Signed)
Received message from pt's disability company with a question of whether or not pt had restrictions for work after 12/26/11.  I returned the call th Cassie Freer [phone]  Ext [phone] and informed her that pt did not have any work restrictions after 12/26/11.

## 2012-06-15 ENCOUNTER — Telehealth: Payer: Self-pay | Admitting: *Deleted

## 2012-06-15 NOTE — Telephone Encounter (Signed)
Pt left message stating that she needs an alternate BP medication to be prescribed because it is too expensive.  I returned pt's call and discussed her concern.  She was last seen in our office on 12/10/11 by Dr. Marice Potter for post op, BP med was prescribed by Dr. Macon Large on 08/14/11. I advised pt that she should be seen by a PCP for evaluation and treatment of her BP. When those types of medication are prescribed by our doctors it is intended for short term use until complete evaluation can be done by a PCP.  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  Pt was given tel# for [facility] physician referral service  [phone].  Pt voiced understanding.

## 2012-09-28 ENCOUNTER — Ambulatory Visit (HOSPITAL_BASED_OUTPATIENT_CLINIC_OR_DEPARTMENT_OTHER): Payer: BC Managed Care – PPO | Attending: Internal Medicine | Admitting: Radiology

## 2012-09-28 VITALS — Ht 65.0 in | Wt 287.0 lb

## 2012-09-28 DIAGNOSIS — G4733 Obstructive sleep apnea (adult) (pediatric): Secondary | ICD-10-CM | POA: Insufficient documentation

## 2012-10-02 DIAGNOSIS — G4733 Obstructive sleep apnea (adult) (pediatric): Secondary | ICD-10-CM

## 2012-10-02 DIAGNOSIS — R0609 Other forms of dyspnea: Secondary | ICD-10-CM

## 2012-10-02 DIAGNOSIS — R0989 Other specified symptoms and signs involving the circulatory and respiratory systems: Secondary | ICD-10-CM

## 2012-10-02 NOTE — Procedures (Signed)
NAMEHALIEGH, Brittney Ross                 ACCOUNT NO.:  [id number]  MEDICAL RECORD NO.:  [id number]          PATIENT TYPE:  OUT  LOCATION:  SLEEP CENTER                 FACILITY:  [facility]  PHYSICIAN:  Clinton D. Maple Hudson, MD, FCCP, FACPDATE OF BIRTH:  21-Jul-1962  DATE OF STUDY:  09/28/2012                           NOCTURNAL POLYSOMNOGRAM  REFERRING PHYSICIAN:  Jackie Plum, M.D.  REFERRING PHYSICIAN:  Jackie Plum, MD  INDICATION FOR STUDY:  Hypersomnia with sleep apnea.  EPWORTH SLEEPINESS SCORE:  4/24.  BMI 47.8, weight 287 pounds, height 65 inches, neck 16 inches.  MEDICATIONS:  Home medications are charted and reviewed.  SLEEP ARCHITECTURE:  Total sleep time 324 minutes with sleep efficiency 79.3%.  Stage I was 13.9%, stage II 64.8%.  Stage III absent, REM 21.3% of total sleep time.  Sleep latency 17.5 minutes, REM latency 100 minutes.  Awake after sleep onset 67 minutes, arousal index 20.9. Bedtime medication:  None.  RESPIRATORY DATA:  Apnea/hypopnea index (AHI) 39.1 per hour.  A total of 211 events was scored including 71 obstructive apneas, 2 central apneas, 3 mixed apneas, 135 hypopneas.  Events were not positional.  REM AHI 85.2 per hour.  This was a diagnostic NPSG protocol as ordered, without CPAP.  OXYGEN DATA:  Moderately loud snoring with oxygen desaturation to a nadir of 71% and mean oxygen saturation through the study of 96.1% on room air.  CARDIAC DATA:  Normal sinus rhythm.  MOVEMENT/PARASOMNIA:  No significant movement disturbance.  Bathroom x1.  IMPRESSION/RECOMMENDATION: 1. Sleep architecture was fragmented with frequent spontaneous     wakings.  No bedtime medication. 2. Severe obstructive sleep apnea/hypopnea syndrome, AHI 39.1 per     hour.  Non-positional events.  REM AHI 85.2 per hour.  Moderately     loud snoring with oxygen desaturation to a nadir of 71% and mean     oxygen     saturation through the study of 96.1% on room air. 3. This is a  diagnostic NPSG protocol as ordered.  Consider return for     dedicated CPAP titration study.     Clinton D. Maple Hudson, MD, [facility], FACP Diplomate, American Board of Sleep Medicine    CDY/MEDQ  D:  10/02/2012 11:46:14  T:  10/02/2012 19:10:22  Job:  [id number]

## 2012-10-18 ENCOUNTER — Encounter: Payer: BC Managed Care – PPO | Attending: Internal Medicine

## 2012-10-25 ENCOUNTER — Encounter: Payer: Self-pay | Admitting: *Deleted

## 2013-05-10 ENCOUNTER — Encounter: Payer: Self-pay | Admitting: Internal Medicine

## 2013-05-10 ENCOUNTER — Ambulatory Visit (INDEPENDENT_AMBULATORY_CARE_PROVIDER_SITE_OTHER): Payer: BC Managed Care – PPO | Admitting: Internal Medicine

## 2013-05-10 VITALS — BP 130/80 | HR 88 | Temp 98.6°F | Ht 65.0 in | Wt 287.0 lb

## 2013-05-10 DIAGNOSIS — E0500 Thyrotoxicosis with diffuse goiter without thyrotoxic crisis or storm: Secondary | ICD-10-CM

## 2013-05-10 DIAGNOSIS — E032 Hypothyroidism due to medicaments and other exogenous substances: Secondary | ICD-10-CM

## 2013-05-10 LAB — T4, FREE: Free T4: 0.54 ng/dL — ABNORMAL LOW (ref 0.60–1.60)

## 2013-05-10 NOTE — Progress Notes (Signed)
Patient ID: Brittney Ross, female   DOB: 07/22/1961, 52 y.o.   MRN: [id number]   HPI  Brittney Ross is a 52 y.o.-year-old female, self-referred for evaluation for h/o Graves Ds, now with post-surgical hypothyroidism.  Pt. has been dx with Graves Ds. in 03/2010. She tells me that she was feeling poorly and lost 50 lbs before dx.  I reviewed pt's thyroid tests available in Epic, but they are only available up to 2011, after which she was followed in [place]. Lab Results  Component Value Date   TSH 0.005* 03/25/2010   TSH 0.020* 01/17/2009   TSH 1.482 02/21/2008   FREET4 2.34* 03/26/2010   03/29/2010: Thyroid ultrasound: inhomogeneous and nodular thyroid, only a single solid nodule measured in left upper lobe, measuring 7 x 4 x 5 cm. Otherwise, small hypo-echogenic nodules.  04/17/2010: Thyroid uptake and scan: uptake 60.7% (10-35%), relatively homogeneous, without focal lesions  After dx of Graves Ds, he was seen by Dr. Alvy Beal (endocrinologist) who performed RAI ablation in 05/24/2013.  Patient studied to see Dr. Julio Sicks, with last visit being last year, and patient was mentioned that she was feeling poorly, with fatigue and weight gain. The thyroid test was "low". She was started on Levothyroxine generic 50 mcg. She takes this with water, fasting, separated by >30 min from b'fast. Does not take calcium, iron, PPIs, or multiltivitamins.   He tells me that she did not have repeat thyroid test afterwards.  She now (in last mo) c/o: - no energy - fatigue - no weight gain/loss - intolerant to heat - constipation same as before - no dry skin - no hair falling - no depression/anxiety - no tremors - no palpit. - no pbs with concentration  - + SOB - had migraines, but resolved after hysterectomy for fibroids last year.  Pt denies feeling nodules in neck, hoarseness, dysphagia/odynophagia, SOB with lying down.  She has a + FH of thyroid disorders in: aunt, sister (both with Graves). No  FH of thyroid cancer. No h/o radiation tx to head or neck, except RAI ablation.  I reviewed her chart and she also has a history of Hypertension, hyperlipidemia, anemia - fibroids - status post hysterectomy 11/02/2011.  ROS: - please see HPI + Eyes: no blurry vision, no xerophthalmia Cardiovascular: no CP/SOB/palpitations/leg swelling Respiratory: no cough/SOB Gastrointestinal: no N/V/D/+C Musculoskeletal: no muscle/joint aches Skin: no rashes Neurological: no tremors/numbness/tingling/dizziness Psychiatric: no depression/anxiety  Past Medical History  Diagnosis Date  . Hyperlipidemia   . Anemia   . Thyroid disease hyperthyroidism  . Morbid obesity   . Morbid obesity   . Hypertension     Pt states she "doesn't have HTN"    Past Surgical History  Procedure Laterality Date  . Cesarean section  12/07/79  . Cesarean section  05/17/81  . Cesarean section  09/06/89  . Tubal ligation    . Rath/bso/cystoscopy     History   Social History  . Marital Status: Married    Spouse Name: N/A    Number of Children: 3   Occupational History  . 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 Smoking status: Never Smoker   . Smokeless tobacco: Not on file  . Alcohol Use: No  . Drug Use: No  . Sexually Active: Yes    Birth Control/ Protection: Surgical   Name  Route  Sig   . cholecalciferol (VITAMIN D) 1000 UNITS tablet   Oral   Take 1,000 Units by  mouth daily.    Marland Kitchen levothyroxine (SYNTHROID, LEVOTHROID) 50 MCG tablet   Oral   Take 50 mcg by mouth daily before breakfast.    . lisinopril-hydrochlorothiazide (PRINZIDE,ZESTORETIC) 20-25 MG per tablet   Oral   Take 1 tablet by mouth daily.     No Known Allergies  Family History  Problem Relation Age of Onset  . Hypertension Father   . Heart disease Mother   . Hypertension Sister    PE: BP 130/80  Pulse 88  Temp(Src) 98.6 F (37 C) (Oral)  Ht 5\' 5"  (1.651 m)  Wt 287 lb (130.182 kg)  BMI 47.76 kg/m2   LMP 09/09/2011 Wt Readings from Last 3 Encounters:  05/10/13 287 lb (130.182 kg)  09/28/12 287 lb (130.182 kg)  12/10/11 262 lb 14.4 oz (119.251 kg)   Constitutional: overweight, in NAD Eyes: PERRLA, EOMI, no exophthalmos ENT: moist mucous membranes, no thyromegaly, no cervical lymphadenopathy Cardiovascular: RRR, No MRG Respiratory: CTA B Gastrointestinal: abdomen soft, NT, ND, BS+ Musculoskeletal: no deformities, strength intact in all 4 Skin: moist, warm, no rashes Neurological: no tremor with outstretched hands, DTR normal in all 4  ASSESSMENT: 1. History of Graves' disease - Status post radioactive iodine ablation 05/2010  2. Hypothyroidism - started on Synthroid 50 mcg daily in 2013  PLAN:  1. Patient with previous history of Graves' disease, status post radioactive iodine ablation 3 years ago, started on levothyroxine last year. We do not have a recent thyroid tests for the patient, and we decided to obtain them today. She is afraid that her hyperthyroidism might have come back, however, based on her symptoms, I doubt this. - we discussed proper intake of Levothyroxine, >30 min before b'fast, separated by >4h from antacids, calcium, iron, MVI - I will check a new TSH and fT4 - I did not refill her Synthroid, we will wait for the results of her tests - I will see her back in 6 months, but might call her back in 2 months for labs if need to change her Synthroid dose - pt agrees to plan  Office Visit on 05/10/2013  Component Date Value Range Status  . TSH 05/10/2013 6.26* 0.35 - 5.50 uIU/mL Final  . Free T4 05/10/2013 0.54* 0.60 - 1.60 ng/dL Final   Will increase dose  Synthroid 75 mcg daily and recheck labs in 6-8 weeks.

## 2013-05-10 NOTE — Patient Instructions (Addendum)
Please join MyChart, I will send you the results through there. Please return in 6 months for another visit, but might need to return sooner for labs.

## 2013-05-13 ENCOUNTER — Other Ambulatory Visit: Payer: Self-pay | Admitting: Internal Medicine

## 2013-05-13 DIAGNOSIS — E039 Hypothyroidism, unspecified: Secondary | ICD-10-CM

## 2013-05-13 MED ORDER — LEVOTHYROXINE SODIUM 75 MCG PO TABS
75.0000 ug | ORAL_TABLET | Freq: Every day | ORAL | Status: DC
Start: 2013-05-13 — End: 2014-09-21

## 2013-05-13 NOTE — Progress Notes (Signed)
Called and discussed with pt about increasing Synthroid dose.

## 2013-07-01 ENCOUNTER — Other Ambulatory Visit: Payer: BC Managed Care – PPO

## 2013-11-10 ENCOUNTER — Ambulatory Visit: Payer: BC Managed Care – PPO | Admitting: Internal Medicine

## 2014-08-21 ENCOUNTER — Encounter: Payer: Self-pay | Admitting: Internal Medicine

## 2014-09-21 ENCOUNTER — Telehealth: Payer: Self-pay | Admitting: Internal Medicine

## 2014-09-21 ENCOUNTER — Other Ambulatory Visit: Payer: Self-pay | Admitting: *Deleted

## 2014-09-21 MED ORDER — LEVOTHYROXINE SODIUM 75 MCG PO TABS: 75.0000 ug | ORAL_TABLET | Freq: Every day | ORAL | Status: DC

## 2014-09-21 NOTE — Telephone Encounter (Signed)
Levothyroxine needs refillls call into [facility] please

## 2015-06-29 ENCOUNTER — Ambulatory Visit (INDEPENDENT_AMBULATORY_CARE_PROVIDER_SITE_OTHER): Payer: BLUE CROSS/BLUE SHIELD | Admitting: Internal Medicine

## 2015-06-29 ENCOUNTER — Other Ambulatory Visit (INDEPENDENT_AMBULATORY_CARE_PROVIDER_SITE_OTHER): Payer: BLUE CROSS/BLUE SHIELD

## 2015-06-29 ENCOUNTER — Encounter: Payer: Self-pay | Admitting: Internal Medicine

## 2015-06-29 VITALS — BP 130/80 | HR 70 | Temp 98.2°F | Resp 12 | Wt 304.4 lb

## 2015-06-29 DIAGNOSIS — E890 Postprocedural hypothyroidism: Secondary | ICD-10-CM

## 2015-06-29 LAB — TSH: TSH: 36.51 u[IU]/mL — AB (ref 0.35–4.50)

## 2015-06-29 LAB — T4, FREE: Free T4: 0.38 ng/dL — ABNORMAL LOW (ref 0.60–1.60)

## 2015-06-29 MED ORDER — LEVOTHYROXINE SODIUM 88 MCG PO TABS: 88.0000 ug | ORAL_TABLET | Freq: Every day | ORAL | Status: DC

## 2015-06-29 NOTE — Progress Notes (Signed)
Patient ID: Brittney Ross, female   DOB: 1961/05/27, 54 y.o.   MRN: [id number]   HPI  Brittney Ross is a 54 y.o.-year-old female, returning for h/o Graves Ds, now with post-surgical hypothyroidism. Last visit 2 years and 2 mo ago.  Reviewed hx: Pt. has been dx with Graves Ds. in 03/2010. She tells me that she was feeling poorly and lost 50 lbs before dx.  I reviewed pt's thyroid tests available in Epic, but they are only available up to 2011, after which she was followed in [place].  Latest levels: Lab Results  Component Value Date   TSH 6.26* 05/10/2013   TSH 0.005* 03/25/2010   TSH 0.020* 01/17/2009   TSH 1.482 02/21/2008   FREET4 0.54* 05/10/2013   FREET4 2.34* 03/26/2010   03/29/2010: Thyroid ultrasound: inhomogeneous and nodular thyroid, only a single solid nodule measured in left upper lobe, measuring 7 x 4 x 5 cm. Otherwise, small hypo-echogenic nodules.  04/17/2010: Thyroid uptake and scan: uptake 60.7% (10-35%), relatively homogeneous, without focal lesions  After dx of Graves Ds, he was seen by Dr. Janey Greaser (endocrinologist) who performed RAI ablation in 05/24/2013.  She became hypothyroid and was started on Levothyroxine generic 50 mcg >> increased to 75 mcg 2 years ago but pt was then lost for f/u.  She takes LT4: - with water - fasting - separated by >30 min from b'fast - Does not take calcium, iron, PPIs, or multiltivitamins.  - not on Biotin - got a Kenalog inj 2 weeks ago  She c/o: - + fatigue - + weight gain - + intolerant to heat - no constipation - no dry skin - no hair falling - no depression/anxiety - no tremors - no palpit. - no SOB - had migraines, but resolved after hysterectomy for fibroids last year.  Pt denies feeling nodules in neck, hoarseness, dysphagia/odynophagia, SOB with lying down.  She also has a history of Hypertension, hyperlipidemia, anemia - fibroids - status post hysterectomy 11/02/2011.  ROS: - please see HPI + Eyes: no  blurry vision, no xerophthalmia Cardiovascular: no CP/SOB/palpitations/leg swelling Respiratory: no cough/SOB Gastrointestinal: no N/V/D/C Musculoskeletal: no muscle/+ joint aches (L knee pain) Skin: no rashes Neurological: no tremors/numbness/tingling/dizziness Psychiatric: no depression/anxiety  I reviewed pt's medications, allergies, PMH, social hx, family hx, and changes were documented in the history of present illness. Otherwise, unchanged from my initial visit note:  Past Medical History  Diagnosis Date  . Hyperlipidemia   . Anemia   . Thyroid disease hyperthyroidism  . Morbid obesity   . Morbid obesity   . Hypertension     Pt states she "doesn't have HTN"    Past Surgical History  Procedure Laterality Date  . Cesarean section  12/07/79  . Cesarean section  05/17/81  . Cesarean section  09/06/89  . Tubal ligation    . Rath/bso/cystoscopy     History   Social History  . Marital Status: Married    Spouse Name: N/A    Number of Children: 3   Occupational History  . 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 Smoking status: Never Smoker   . Smokeless tobacco: Not on file  . Alcohol Use: No  . Drug Use: No  . Sexually Active: Yes    Birth Control/ Protection: Surgical   Name  Route  Sig   . cholecalciferol (VITAMIN D) 1000 UNITS tablet   Oral   Take 1,000 Units by mouth daily.    Marland Kitchen  levothyroxine (SYNTHROID, LEVOTHROID) 50 MCG tablet   Oral   Take 50 mcg by mouth daily before breakfast.    . lisinopril-hydrochlorothiazide (PRINZIDE,ZESTORETIC) 20-25 MG per tablet   Oral   Take 1 tablet by mouth daily.     No Known Allergies  Family History  Problem Relation Age of Onset  . Hypertension Father   . Heart disease Mother   . Hypertension Sister    PE: BP 130/80 mmHg  Pulse 70  Temp(Src) 98.2 F (36.8 C) (Oral)  Resp 12  Wt 304 lb 6.4 oz (138.075 kg)  SpO2 97%  LMP 09/09/2011 Body mass index is 50.65 kg/(m^2). Wt Readings  from Last 3 Encounters:  06/29/15 304 lb 6.4 oz (138.075 kg)  05/10/13 287 lb (130.182 kg)  09/28/12 287 lb (130.182 kg)   Constitutional: overweight, in NAD Eyes: PERRLA, EOMI, no exophthalmos ENT: moist mucous membranes, no thyromegaly, no cervical lymphadenopathy Cardiovascular: RRR, No MRG Respiratory: CTA B Gastrointestinal: abdomen soft, NT, ND, BS+ Musculoskeletal: no deformities, strength intact in all 4 Skin: moist, warm, no rashes Neurological: no tremor with outstretched hands, DTR normal in all 4  ASSESSMENT: 1. History of Graves' disease - Status post radioactive iodine ablation 05/2010  2. Hypothyroidism - started on Synthroid 50 mcg daily in 2013  PLAN:  1. Patient with previous history of Graves' disease, status post radioactive iodine ablation in 2011, started on levothyroxine 2 years ago >> last dose increase was from 50 to 75 mcg, after which she was lost for f/u. She returns today as she needs a refill. She mentions she is taking her LT4 daily, not skipping doses. I do not have a recent thyroid test set for the patient >> will obtain them today.  - we discussed proper intake of Levothyroxine, >30 min before b'fast, separated by >4h from antacids, calcium, iron, MVI - I will check a new TSH and fT4 - I did not refill her LT4 we will wait for the results of her tests - I will see her back in 6 months, but might call her back in 2 months for labs if need to change her LT4 dose  Appointment on 06/29/2015  Component Date Value Ref Range Status  . TSH 06/29/2015 36.51* 0.35 - 4.50 uIU/mL Final  . Free T4 06/29/2015 0.38* 0.60 - 1.60 ng/dL Final   TSH is very high and I suspect a degree of noncompliance, since her last refill was 9 months ago. However, I suspect that 75 g of levothyroxine might be too low for her, so will increase the dose to 88 g daily and recheck her thyroid tests in 2 months.

## 2015-06-29 NOTE — Patient Instructions (Signed)
Please stop at the lab.  Please continue Levothyroxine 75 mcg daily.  Take the thyroid hormone every day, with water, >30 minutes before breakfast, separated by >4 hours from acid reflux medications, calcium, iron, multivitamins.  Please return in 6 months.

## 2015-07-04 ENCOUNTER — Encounter: Payer: Self-pay | Admitting: *Deleted

## 2015-12-27 ENCOUNTER — Ambulatory Visit: Payer: BLUE CROSS/BLUE SHIELD | Admitting: Internal Medicine

## 2016-03-04 ENCOUNTER — Telehealth: Payer: Self-pay | Admitting: Internal Medicine

## 2016-03-04 MED ORDER — LEVOTHYROXINE SODIUM 88 MCG PO TABS: 88.0000 ug | ORAL_TABLET | Freq: Every day | ORAL | Status: DC

## 2016-03-04 NOTE — Telephone Encounter (Signed)
Patient need refill on levothyroxine (SYNTHROID, LEVOTHROID) 88 MCG tablet,  [id number] [place] [address]), [place] [address] [phone] (Phone) [phone] (Fax)

## 2016-09-02 ENCOUNTER — Other Ambulatory Visit: Payer: Self-pay

## 2016-09-02 ENCOUNTER — Telehealth: Payer: Self-pay | Admitting: Internal Medicine

## 2016-09-02 MED ORDER — LEVOTHYROXINE SODIUM 88 MCG PO TABS: 88.0000 ug | ORAL_TABLET | Freq: Every day | ORAL | 0 refills | Status: DC

## 2016-09-02 NOTE — Telephone Encounter (Signed)
Pt called to schedule her appointment, first available is in December, but she needs a refill of her medication until then. Levothyroxine.

## 2016-09-02 NOTE — Telephone Encounter (Signed)
Done appt on 12/29

## 2016-09-02 NOTE — Telephone Encounter (Signed)
?  OK TO REFILL °

## 2016-09-02 NOTE — Telephone Encounter (Signed)
OK for 1 mo, then schedule a new appt in 09/2016.

## 2016-10-09 ENCOUNTER — Encounter (HOSPITAL_COMMUNITY): Payer: Self-pay | Admitting: Emergency Medicine

## 2016-10-09 ENCOUNTER — Emergency Department (HOSPITAL_COMMUNITY)
Admission: EM | Admit: 2016-10-09 | Discharge: 2016-10-09 | Disposition: A | Discharge status: Home / Self Care | Payer: BLUE CROSS/BLUE SHIELD | Attending: Emergency Medicine | Admitting: Emergency Medicine

## 2016-10-09 ENCOUNTER — Emergency Department (HOSPITAL_COMMUNITY): Payer: BLUE CROSS/BLUE SHIELD

## 2016-10-09 DIAGNOSIS — R1011 Right upper quadrant pain: Secondary | ICD-10-CM | POA: Diagnosis not present

## 2016-10-09 DIAGNOSIS — E039 Hypothyroidism, unspecified: Secondary | ICD-10-CM | POA: Diagnosis not present

## 2016-10-09 DIAGNOSIS — I10 Essential (primary) hypertension: Secondary | ICD-10-CM | POA: Diagnosis not present

## 2016-10-09 DIAGNOSIS — R1013 Epigastric pain: Secondary | ICD-10-CM | POA: Insufficient documentation

## 2016-10-09 LAB — URINALYSIS, ROUTINE W REFLEX MICROSCOPIC
BILIRUBIN URINE: NEGATIVE
Glucose, UA: NEGATIVE mg/dL
Hgb urine dipstick: NEGATIVE
KETONES UR: NEGATIVE mg/dL
LEUKOCYTES UA: NEGATIVE
NITRITE: NEGATIVE
PH: 5 (ref 5.0–8.0)
Protein, ur: NEGATIVE mg/dL
Specific Gravity, Urine: 1.017 (ref 1.005–1.030)

## 2016-10-09 LAB — CBC
HEMATOCRIT: 36.4 % (ref 36.0–46.0)
HEMOGLOBIN: 11.8 g/dL — AB (ref 12.0–15.0)
MCH: 28.9 pg (ref 26.0–34.0)
MCHC: 32.4 g/dL (ref 30.0–36.0)
MCV: 89 fL (ref 78.0–100.0)
Platelets: 198 10*3/uL (ref 150–400)
RBC: 4.09 MIL/uL (ref 3.87–5.11)
RDW: 15.7 % — AB (ref 11.5–15.5)
WBC: 7.3 10*3/uL (ref 4.0–10.5)

## 2016-10-09 LAB — LIPASE, BLOOD: Lipase: 20 U/L (ref 11–51)

## 2016-10-09 LAB — COMPREHENSIVE METABOLIC PANEL
ALK PHOS: 58 U/L (ref 38–126)
ALT: 21 U/L (ref 14–54)
ANION GAP: 7 (ref 5–15)
AST: 33 U/L (ref 15–41)
Albumin: 4.1 g/dL (ref 3.5–5.0)
BILIRUBIN TOTAL: 1.1 mg/dL (ref 0.3–1.2)
BUN: 15 mg/dL (ref 6–20)
CALCIUM: 8.8 mg/dL — AB (ref 8.9–10.3)
CO2: 24 mmol/L (ref 22–32)
Chloride: 108 mmol/L (ref 101–111)
Creatinine, Ser: 1 mg/dL (ref 0.44–1.00)
GFR calc Af Amer: >60 mL/min (ref >60)
Glucose, Bld: 127 mg/dL — ABNORMAL HIGH (ref 65–99)
POTASSIUM: 4.5 mmol/L (ref 3.5–5.1)
Sodium: 139 mmol/L (ref 135–145)
TOTAL PROTEIN: 8.5 g/dL — AB (ref 6.5–8.1)

## 2016-10-09 MED ORDER — IOPAMIDOL (ISOVUE-300) INJECTION 61%
INTRAVENOUS | Status: AC
  Filled 2016-10-09: qty 100

## 2016-10-09 MED ORDER — HYDROCODONE-ACETAMINOPHEN 5-325 MG PO TABS: 1.0000 | ORAL_TABLET | ORAL | 0 refills | Status: DC | PRN

## 2016-10-09 MED ORDER — ONDANSETRON 4 MG PO TBDP: 4.0000 mg | ORAL_TABLET | Freq: Three times a day (TID) | ORAL | 0 refills | Status: DC | PRN

## 2016-10-09 MED ORDER — IOPAMIDOL (ISOVUE-300) INJECTION 61%
100.0000 mL | Freq: Once | INTRAVENOUS | Status: AC | PRN
  Administered 2016-10-09: 100 mL via INTRAVENOUS

## 2016-10-09 MED ORDER — ONDANSETRON HCL 4 MG/2ML IJ SOLN
4.0000 mg | Freq: Once | INTRAMUSCULAR | Status: AC
  Administered 2016-10-09: 4 mg via INTRAVENOUS
  Filled 2016-10-09: qty 2

## 2016-10-09 MED ORDER — HYDROMORPHONE HCL 2 MG/ML IJ SOLN
0.5000 mg | Freq: Once | INTRAMUSCULAR | Status: AC
  Administered 2016-10-09: 0.5 mg via INTRAVENOUS
  Filled 2016-10-09: qty 1

## 2016-10-09 NOTE — Discharge Instructions (Signed)
Please read and follow all provided instructions.  Your diagnoses today include:  1. Right upper quadrant abdominal pain   2. RUQ abdominal pain     Tests performed today include:  Blood counts and electrolytes  Blood tests to check liver and kidney function  Blood tests to check pancreas function  Urine test to look for infection  CT scan - shows diverticulosis and fatty liver  Ultrasound - shows normal gallbladder and no gallstones  Vital signs. See below for your results today.   Medications prescribed:   Vicodin (hydrocodone/acetaminophen) - narcotic pain medication  DO NOT drive or perform any activities that require you to be awake and alert because this medicine can make you drowsy. BE VERY CAREFUL not to take multiple medicines containing Tylenol (also called acetaminophen). Doing so can lead to an overdose which can damage your liver and cause liver failure and possibly death.   Zofran (ondansetron) - for nausea and vomiting  Take any prescribed medications only as directed.  Home care instructions:   Follow any educational materials contained in this packet.  Follow-up instructions: Please follow-up with your primary care provider in the next 3 days for further evaluation of your symptoms.    Return instructions:  SEEK IMMEDIATE MEDICAL ATTENTION IF:  The pain does not go away or becomes severe   A temperature above 101F develops   Repeated vomiting occurs (multiple episodes)   The pain becomes localized to portions of the abdomen. The right side could possibly be appendicitis. In an adult, the left lower portion of the abdomen could be colitis or diverticulitis.   Blood is being passed in stools or vomit (bright red or black tarry stools)   You develop chest pain, difficulty breathing, dizziness or fainting, or become confused, poorly responsive, or inconsolable (young children)  If you have any other emergent concerns regarding your  health  Additional Information: Abdominal (belly) pain can be caused by many things. Your caregiver performed an examination and possibly ordered blood/urine tests and imaging (CT scan, x-rays, ultrasound). Many cases can be observed and treated at home after initial evaluation in the emergency department. Even though you are being discharged home, abdominal pain can be unpredictable. Therefore, you need a repeated exam if your pain does not resolve, returns, or worsens. Most patients with abdominal pain don't have to be admitted to the hospital or have surgery, but serious problems like appendicitis and gallbladder attacks can start out as nonspecific pain. Many abdominal conditions cannot be diagnosed in one visit, so follow-up evaluations are very important.  Your vital signs today were: BP 160/69    Pulse 76    Temp 98.1 F (36.7 C) (Oral)    Resp 16    LMP 09/09/2011    SpO2 96%  If your blood pressure (bp) was elevated above 135/85 this visit, please have this repeated by your doctor within one month. --------------

## 2016-10-09 NOTE — ED Notes (Signed)
Patient is A & O x4.  She understood discharge instructions. 

## 2016-10-09 NOTE — ED Triage Notes (Signed)
Per patient, states abdominal pain and diarrhea that started 4 days ago-states increased pain today and vomiting

## 2016-10-09 NOTE — ED Provider Notes (Signed)
[facility] DEPT Provider Note   CSN: [id number] Arrival date & time: 10/09/16  [id number]     History   Chief Complaint Chief Complaint  Patient presents with  . Abdominal Pain    HPI Brittney Ross is a 55 y.o. female.  Patient with h/o cesarean section 3, hypothyroidism -- presents with four-day history of abdominal pain, worsening right upper quadrant, nausea, 4-5 episodes per day of watery stool, and vomiting starting this morning. No fevers. Pain does not radiate. No chest pain or shortness of breath. No urinary symptoms. No treatments prior to arrival. Pain has been worsening and is now 8 out of 10. Patient denies alcohol use or heavy NSAID use. No recent travels. Patient reports decreased appetite. Pain has not been worse with eating. Last oral intake was last night. The onset of this condition was acute. Aggravating factors: none. Alleviating factors: none.        Past Medical History:  Diagnosis Date  . Anemia   . Hyperlipidemia   . Hypertension    Pt states she "doesn't have HTN"  . Morbid obesity ([place])   . Morbid obesity ([place])   . Thyroid disease hyperthyroidism    Patient Active Problem List   Diagnosis Date Noted  . Chronic female pelvic pain 08/14/2011  . GRAVES' DISEASE 03/20/2010  . 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 OBESITY 03/20/2008  . HYPERTENSION, BENIGN ESSENTIAL 03/20/2008  . HYPERCHOLESTEROLEMIA 02/22/2008  . MIGRAINE HEADACHE 02/21/2008  . KNEE PAIN, RIGHT 02/21/2008  . FIBROIDS, UTERUS 02/06/2008  . ANEMIA 02/06/2008    Past Surgical History:  Procedure Laterality Date  . CESAREAN SECTION  12/07/79  . CESAREAN SECTION  05/17/81  . CESAREAN SECTION  09/06/89  . RATH/BSO/cystoscopy    . TUBAL LIGATION      OB History    Gravida Para Term Preterm AB Living   3 3 3  0 0 3   SAB TAB Ectopic Multiple Live Births   0 0 0 0 3       Home Medications    Prior to Admission medications   Medication Sig Start Date End Date  Taking? Authorizing Provider  cholecalciferol (VITAMIN D) 1000 UNITS tablet Take 1,000 Units by mouth daily.    Historical Provider, MD  levothyroxine (SYNTHROID, LEVOTHROID) 88 MCG tablet Take 1 tablet (88 mcg total) by mouth daily. 09/02/16   Philemon Kingdom, MD    Family History Family History  Problem Relation Age of Onset  . Hypertension Father   . Heart disease Mother   . Hypertension Sister     Social History Social History  Substance Use Topics  . Smoking status: Never Smoker  . Smokeless tobacco: Not on file  . Alcohol use No     Allergies   Patient has no known allergies.   Review of Systems Review of Systems  Constitutional: Negative for fever.  HENT: Negative for rhinorrhea and sore throat.   Eyes: Negative for redness.  Respiratory: Negative for cough.   Cardiovascular: Negative for chest pain.  Gastrointestinal: Positive for abdominal pain, diarrhea, nausea and vomiting. Negative for blood in stool.  Genitourinary: Negative for dysuria.  Musculoskeletal: Negative for myalgias.  Skin: Negative for rash.  Neurological: Negative for headaches.     Physical Exam Updated Vital Signs BP 157/78 (BP Location: Right Arm)   Pulse 88   Temp 98.1 F (36.7 C) (Oral)   Resp 18   LMP 09/09/2011   SpO2 98%   Physical Exam  Constitutional: She appears  well-developed and well-nourished.  HENT:  Head: Normocephalic and atraumatic.  Eyes: Conjunctivae are normal. Right eye exhibits no discharge. Left eye exhibits no discharge.  Neck: Normal range of motion. Neck supple.  Cardiovascular: Normal rate, regular rhythm and normal heart sounds.   Pulmonary/Chest: Effort normal and breath sounds normal.  Abdominal: Soft. She exhibits no mass. There is tenderness in the right upper quadrant and epigastric area. There is no guarding and no tenderness at McBurney's point.  Difficult to localize pain on exam 2/2 habitus.   Neurological: She is alert.  Skin: Skin is warm  and dry.  Psychiatric: She has a normal mood and affect.  Nursing note and vitals reviewed.    ED Treatments / Results  Labs (all labs ordered are listed, but only abnormal results are displayed) Labs Reviewed  COMPREHENSIVE METABOLIC PANEL - Abnormal; Notable for the following:       Result Value   Glucose, Bld 127 (*)    Calcium 8.8 (*)    Total Protein 8.5 (*)    All other components within normal limits  CBC - Abnormal; Notable for the following:    Hemoglobin 11.8 (*)    RDW 15.7 (*)    All other components within normal limits  URINALYSIS, ROUTINE W REFLEX MICROSCOPIC - Abnormal; Notable for the following:    APPearance HAZY (*)    All other components within normal limits  LIPASE, BLOOD    EKG  EKG Interpretation None       Radiology US Abdomen Limited Ruq  Result Date: 10/09/2016 CLINICAL DATA:  Abdominal pain with vomiting EXAM: US ABDOMEN LIMITED - RIGHT UPPER QUADRANT COMPARISON:  None. FINDINGS: Gallbladder: No gallstones or wall thickening visualized. There is no pericholecystic fluid. No sonographic Murphy sign noted by sonographer. Common bile duct: Diameter: 3 mm. There is no intrahepatic or extrahepatic biliary duct dilatation. Liver: No focal lesion identified.  Liver echogenicity is increased. IMPRESSION: Increased liver echogenicity, a finding most likely indicative of a degree of hepatic steatosis. While no focal liver lesions are identified, it must be cautioned that the sensitivity of ultrasound for detection of focal liver lesions is diminished in this circumstance. Study otherwise unremarkable. Electronically Signed   By: Lowella Grip III M.D.   On: 10/09/2016 10:03    Procedures Procedures (including critical care time)  Medications Ordered in ED Medications  HYDROmorphone (DILAUDID) injection 0.5 mg (0.5 mg Intravenous Given 10/09/16 0919)  ondansetron (ZOFRAN) injection 4 mg (4 mg Intravenous Given 10/09/16 0919)     Initial  Impression / Assessment and Plan / ED Course  I have reviewed the triage vital signs and the nursing notes.  Pertinent labs & imaging results that were available during my care of the patient were reviewed by me and considered in my medical decision making (see chart for details).  Clinical Course    Patient seen and examined. Work-up initiated. Medications ordered.   Vital signs reviewed and are as follows: BP 157/78 (BP Location: Right Arm)   Pulse 88   Temp 98.1 F (36.7 C) (Oral)   Resp 18   LMP 09/09/2011   SpO2 98%   10:28 AM Patient's pain is improved. Informed of ultrasound results including hepatic steatosis. Discussed negative results for gallstones. Discussed 2 possible options on how to proceed. Given that lab work is reassuring, could treat symptoms and monitor patient for 24 hours at which time she would need a recheck of her abdomen. Or, we can proceed with CT imaging  of the abdomen to ensure no bowel infection or other serious problems. Patient does not have a strong opinion. We agreed to proceed with CT imaging to exclude other issues given that she still has some abdominal tenderness on exam.  Patient informed of results. Her pain is controlled. She is comfortable with d/c to home. Encouraged patient to have fatty liver observed by her physician.  The patient was urged to return to the Emergency Department immediately with worsening of current symptoms, worsening abdominal pain, persistent vomiting, blood noted in stools, fever, or any other concerns. The patient verbalized understanding.   Home with Vicodin x 6, zofran.   Patient counseled on use of narcotic pain medications. Counseled not to combine these medications with others containing tylenol. Urged not to drink alcohol, drive, or perform any other activities that requires focus while taking these medications. The patient verbalizes understanding and agrees with the plan.   Final Clinical Impressions(s) / ED  Diagnoses   Final diagnoses:  RUQ abdominal pain  Right upper quadrant abdominal pain   Patient with abdominal pain. Vitals are stable, no fever. Labs reassuring. Imaging CT and [place] are neg for acute problem. No signs of dehydration, patient is tolerating PO's. Lungs are clear and no signs suggestive of PNA. Low concern for appendicitis, cholecystitis, pancreatitis, ruptured viscus, UTI, kidney stone, aortic dissection, aortic aneurysm or other emergent abdominal etiology. Supportive therapy indicated with return if symptoms worsen.     New Prescriptions Discharge Medication List as of 10/09/2016 12:32 PM    START taking these medications   Details  HYDROcodone-acetaminophen (NORCO/VICODIN) 5-325 MG tablet Take 1-2 tablets by mouth every 4 (four) hours as needed., Starting Thu 10/09/2016, Print    ondansetron (ZOFRAN ODT) 4 MG disintegrating tablet Take 1 tablet (4 mg total) by mouth every 8 (eight) hours as needed for nausea or vomiting., Starting Thu 10/09/2016, Print         McNary, PA-C 10/09/16 1402    Fredia Sorrow, MD 10/09/16 1455

## 2016-10-17 ENCOUNTER — Encounter: Payer: Self-pay | Admitting: Internal Medicine

## 2016-10-17 ENCOUNTER — Ambulatory Visit (INDEPENDENT_AMBULATORY_CARE_PROVIDER_SITE_OTHER): Payer: BLUE CROSS/BLUE SHIELD | Admitting: Internal Medicine

## 2016-10-17 VITALS — BP 142/86 | HR 82 | Temp 98.0°F | Resp 10 | Ht 64.5 in | Wt 315.4 lb

## 2016-10-17 DIAGNOSIS — E890 Postprocedural hypothyroidism: Secondary | ICD-10-CM

## 2016-10-17 LAB — TSH: TSH: 28.74 u[IU]/mL — ABNORMAL HIGH (ref 0.35–4.50)

## 2016-10-17 LAB — T4, FREE: FREE T4: 0.6 ng/dL (ref 0.60–1.60)

## 2016-10-17 NOTE — Progress Notes (Signed)
Patient ID: Brittney Ross, female   DOB: July 16, 1961, 55 y.o.   MRN: [id number]   HPI  Brittney Ross is a 55 y.o.-year-old female, returning for h/o Graves Ds, with post-surgical hypothyroidism. Last visit 1 year and 3 mo ago.   She has a h/o noncompliance with her LT4 dosing and her appts.  Reviewed and addended hx: Pt. has been dx with Graves Ds. in 03/2010. She tells me that she was feeling poorly and lost 50 lbs before dx.   03/29/2010: Thyroid ultrasound: inhomogeneous and nodular thyroid, only a single solid nodule measured in left upper lobe, measuring 7 x 4 x 5 cm. Otherwise, small hypo-echogenic nodules.  04/17/2010: Thyroid uptake and scan: uptake 60.7% (10-35%), relatively homogeneous, without focal lesions  After dx of Graves Ds, he was seen by Dr. Janey Greaser (endocrinologist) who performed RAI ablation in 05/24/2013 >> postablative hypothyroidism (uncontrolled)  Latest levels: Lab Results  Component Value Date   TSH 36.51 (H) 06/29/2015   TSH 6.26 (H) 05/10/2013   TSH 0.005 (L) 03/25/2010   TSH 0.020 (L) 01/17/2009   TSH 1.482 02/21/2008   FREET4 0.38 (L) 06/29/2015   FREET4 0.54 (L) 05/10/2013   FREET4 2.34 (H) 03/26/2010   She is on LT4 88 mcg (increased from 75 mcg at last visit). She takes LT4: - daily, not missing doses - with water - fasting - separated by 60 min from b'fast - Does not take calcium, iron, PPIs, or multiltivitamins.  - not on Biotin  She c/o: - + fatigue - + weight gain - no heat or cold intolerance - no constipation, had recent diarrhea >> resolved - no dry skin - no hair falling - no depression/anxiety - no tremors - no palpit. - no SOB - had migraines, but resolved after hysterectomy for fibroids   Pt denies feeling nodules in neck, hoarseness, dysphagia/odynophagia, SOB with lying down.  She also has a history of Hypertension, hyperlipidemia, anemia - fibroids - status post hysterectomy 11/02/2011.  ROS: - please see HPI + Eyes:  no blurry vision, no xerophthalmia Cardiovascular: no CP/SOB/palpitations/leg swelling Respiratory: no cough/SOB Gastrointestinal: no N/V/D/C Musculoskeletal: no muscle/joint aches Skin: no rashes Neurological: no tremors/numbness/tingling/dizziness  I reviewed pt's medications, allergies, PMH, social hx, family hx, and changes were documented in the history of present illness. Otherwise, unchanged from my initial visit note:  Past Medical History:  Diagnosis Date  . Anemia   . Hyperlipidemia   . Hypertension    Pt states she "doesn't have HTN"  . Morbid obesity ([place])   . Morbid obesity ([place])   . Thyroid disease hyperthyroidism   Past Surgical History:  Procedure Laterality Date  . CESAREAN SECTION  12/07/79  . CESAREAN SECTION  05/17/81  . CESAREAN SECTION  09/06/89  . RATH/BSO/cystoscopy    . TUBAL LIGATION     History   Social History  . Marital Status: Married    Spouse Name: N/A    Number of Children: 3   Occupational History  . 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 Smoking status: Never Smoker   . Smokeless tobacco: Not on file  . Alcohol Use: No  . Drug Use: No  . Sexually Active: Yes    Birth Control/ Protection: Surgical   Name  Route  Sig   . cholecalciferol (VITAMIN D) 1000 UNITS tablet   Oral   Take 1,000 Units by mouth daily.    Marland Kitchen levothyroxine (SYNTHROID, LEVOTHROID) 50 MCG  tablet   Oral   Take 50 mcg by mouth daily before breakfast.    . lisinopril-hydrochlorothiazide (PRINZIDE,ZESTORETIC) 20-25 MG per tablet   Oral   Take 1 tablet by mouth daily.     No Known Allergies  Family History  Problem Relation Age of Onset  . Hypertension Father   . Heart disease Mother   . Hypertension Sister    PE: BP (!) 142/86   Pulse 82   Temp 98 F (36.7 C) (Oral)   Resp 10   Ht 5' 4.5" (1.638 m)   Wt (!) 315 lb 6 oz (143.1 kg)   LMP 09/09/2011   BMI 53.30 kg/m  Body mass index is 53.3 kg/m. Wt Readings from Last 3  Encounters:  10/17/16 (!) 315 lb 6 oz (143.1 kg)  06/29/15 (!) 304 lb 6.4 oz (138.1 kg)  05/10/13 287 lb (130.2 kg)   Constitutional: obese, in NAD Eyes: PERRLA, EOMI, no exophthalmos ENT: moist mucous membranes, no thyromegaly, no cervical lymphadenopathy Cardiovascular: RRR, No MRG, + mild periankle nonpitting edema Respiratory: CTA B Gastrointestinal: abdomen soft, NT, ND, BS+ Musculoskeletal: no deformities, strength intact in all 4 Skin: moist, warm, no rashes Neurological: no tremor with outstretched hands, DTR normal in all 4  ASSESSMENT: 1. History of Graves' disease - Status post radioactive iodine ablation 05/2010  2. Hypothyroidism  PLAN:  1. Patient with previous history of Graves' disease, status post radioactive iodine ablation in 2011, started on levothyroxine afterwards. She is not compliant with appts and labs and was prev. Noncompliant with her LT4 doses. Last TSH was very high >1 year ago >> we increased the dose to 88 and scheduled labs in 6 weeks and another visit in 6 mo >> she did not come back for either. Will recheck labs today. - we discussed about the importance of coming for labs and for visits and discussed some of the possible consequences of uncontrolled hypothyroidism.  - we discussed proper intake of Levothyroxine, >30 min before b'fast, separated by >4h from antacids, calcium, iron, MVI. She is taking it correctly. - I will see her back in 6 months, but advised her that if we need to change her LT4 dose >> needs to return in 5-6 weeks.  Needs refills.  Component     Latest Ref Rng & Units 10/17/2016  TSH     0.35 - 4.50 uIU/mL 28.74 (H)  T4,Free(Direct)     0.60 - 1.60 ng/dL 0.60   TSH is still high. Will increase her levothyroxine dose to 112 g daily and have her back for labs in 2 months.  Philemon Kingdom, MD PhD [facility]

## 2016-10-17 NOTE — Patient Instructions (Addendum)
Please stop at the lab.  Continue Levothyroxine 88 mcg daily.  Take the thyroid hormone every day, with water, at least 30 minutes before breakfast, separated by at least 4 hours from: - acid reflux medications - calcium - iron - multivitamins  Please come back for a follow-up appointment in 6 months.  If we end up changing the Levothyroxine dose, we need a repeat set of labs in 5-6 weeks from now.

## 2016-10-21 ENCOUNTER — Telehealth: Payer: Self-pay

## 2016-10-21 MED ORDER — LEVOTHYROXINE SODIUM 112 MCG PO TABS: 112.0000 ug | ORAL_TABLET | Freq: Every day | ORAL | 1 refills | Status: DC

## 2016-10-21 NOTE — Telephone Encounter (Signed)
-----   Message from Philemon Kingdom, MD sent at 10/21/2016  8:08 AM EST ----- Almyra Free, can you please call pt: TSH is still high. Let's increase her levothyroxine dose to 112 g daily and have her back for labs in 2 months. I sent the new prescription to her pharmacy and ordered the labs.

## 2016-10-21 NOTE — Telephone Encounter (Signed)
Called and gave patient lab results, advised of medication change, and made appointment for 2 months. No other questions at this time.

## 2016-12-25 ENCOUNTER — Other Ambulatory Visit (INDEPENDENT_AMBULATORY_CARE_PROVIDER_SITE_OTHER): Payer: BLUE CROSS/BLUE SHIELD

## 2016-12-25 DIAGNOSIS — E890 Postprocedural hypothyroidism: Secondary | ICD-10-CM

## 2016-12-25 LAB — T4, FREE: Free T4: 0.85 ng/dL (ref 0.60–1.60)

## 2016-12-25 LAB — TSH: TSH: 12.72 u[IU]/mL — AB (ref 0.35–4.50)

## 2016-12-26 ENCOUNTER — Telehealth: Payer: Self-pay

## 2016-12-26 DIAGNOSIS — E0500 Thyrotoxicosis with diffuse goiter without thyrotoxic crisis or storm: Secondary | ICD-10-CM

## 2016-12-26 MED ORDER — LEVOTHYROXINE SODIUM 125 MCG PO TABS: 125.0000 ug | ORAL_TABLET | Freq: Every day | ORAL | 1 refills | Status: DC

## 2016-12-26 NOTE — Telephone Encounter (Signed)
-----   Message from Philemon Kingdom, MD sent at 12/25/2016  5:35 PM EST ----- Almyra Free, can you please call pt: Thyroid tests are better, but TSH is still high. Let's increase her levothyroxine dose from 112 to 125 g. She will need another set of labs in 1.5 months: TSH and free T4.

## 2016-12-26 NOTE — Telephone Encounter (Signed)
Called and gave patient lab results. Patient understood, I advised I would submit new dose to pharmacy and patient made lab appointment.

## 2017-02-25 ENCOUNTER — Other Ambulatory Visit: Payer: BLUE CROSS/BLUE SHIELD

## 2017-03-02 ENCOUNTER — Other Ambulatory Visit (INDEPENDENT_AMBULATORY_CARE_PROVIDER_SITE_OTHER): Payer: BLUE CROSS/BLUE SHIELD

## 2017-03-02 DIAGNOSIS — E0500 Thyrotoxicosis with diffuse goiter without thyrotoxic crisis or storm: Secondary | ICD-10-CM

## 2017-03-02 LAB — TSH: TSH: 1.01 u[IU]/mL (ref 0.35–4.50)

## 2017-03-02 LAB — T4, FREE: Free T4: 1.68 ng/dL — ABNORMAL HIGH (ref 0.60–1.60)

## 2017-03-03 ENCOUNTER — Telehealth: Payer: Self-pay

## 2017-03-03 NOTE — Telephone Encounter (Signed)
-----   Message from Philemon Kingdom, MD sent at 03/03/2017 11:49 AM EDT ----- Almyra Free, can you please call pt: TFTs finally normal! Please continue the current dose of levothyroxine.

## 2017-03-03 NOTE — Telephone Encounter (Signed)
LVM, gave lab results. Gave call back number if any questions or concerns.  

## 2017-03-03 NOTE — Telephone Encounter (Signed)
Patient called I advised of lab results. Patient had no questions.

## 2017-04-27 ENCOUNTER — Ambulatory Visit: Payer: BLUE CROSS/BLUE SHIELD | Admitting: Internal Medicine

## 2017-04-28 ENCOUNTER — Ambulatory Visit: Payer: BLUE CROSS/BLUE SHIELD | Admitting: Internal Medicine

## 2017-06-15 ENCOUNTER — Other Ambulatory Visit: Payer: Self-pay

## 2017-06-15 ENCOUNTER — Telehealth: Payer: Self-pay | Admitting: Internal Medicine

## 2017-06-15 MED ORDER — LEVOTHYROXINE SODIUM 125 MCG PO TABS: 125.0000 ug | ORAL_TABLET | Freq: Every day | ORAL | 1 refills | Status: DC

## 2017-06-15 NOTE — Telephone Encounter (Signed)
Submitted

## 2017-06-15 NOTE — Telephone Encounter (Deleted)
MEDICATION: levothyroxine 125 mcg  PHARMACY: [facility] on [address]    IS THIS A 90 DAY SUPPLY : yes  IS PATIENT OUT OF MEDICATION: no  IF NOT; HOW MUCH IS LEFT: 1  LAST APPOINTMENT DATE:  NEXT APPOINTMENT DATE:  OTHER COMMENTS:    **Let patient know to contact pharmacy at the end of the day to make sure medication is ready. **  ** Please notify patient to allow 48-72 hours to process**  **Encourage patient to contact the pharmacy for refills or they can request refills through [facility]**

## 2017-06-15 NOTE — Telephone Encounter (Signed)
MEDICATION: levothyroxine 125 mcg   PHARMACY: [facility] on [address]    IS THIS A 90 DAY SUPPLY : yes  IS PATIENT OUT OF MEDICATION: 1  IF NOT; HOW MUCH IS LEFT: no  LAST APPOINTMENT DATE: 03/02/17  NEXT APPOINTMENT DATE:06/23/17  OTHER COMMENTS:    **Let patient know to contact pharmacy at the end of the day to make sure medication is ready. **  ** Please notify patient to allow 48-72 hours to process**  **Encourage patient to contact the pharmacy for refills or they can request refills through [facility]**

## 2017-06-16 ENCOUNTER — Emergency Department (HOSPITAL_COMMUNITY)
Admission: EM | Admit: 2017-06-16 | Discharge: 2017-06-17 | Disposition: A | Discharge status: Home / Self Care | Payer: BLUE CROSS/BLUE SHIELD | Attending: Emergency Medicine | Admitting: Emergency Medicine

## 2017-06-16 ENCOUNTER — Encounter (HOSPITAL_COMMUNITY): Payer: Self-pay | Admitting: Emergency Medicine

## 2017-06-16 DIAGNOSIS — E039 Hypothyroidism, unspecified: Secondary | ICD-10-CM | POA: Insufficient documentation

## 2017-06-16 DIAGNOSIS — R05 Cough: Secondary | ICD-10-CM | POA: Diagnosis present

## 2017-06-16 DIAGNOSIS — R059 Cough, unspecified: Secondary | ICD-10-CM

## 2017-06-16 DIAGNOSIS — I10 Essential (primary) hypertension: Secondary | ICD-10-CM | POA: Diagnosis not present

## 2017-06-16 LAB — COMPREHENSIVE METABOLIC PANEL
ALBUMIN: 3.4 g/dL — AB (ref 3.5–5.0)
ALK PHOS: 66 U/L (ref 38–126)
ALT: 19 U/L (ref 14–54)
AST: 20 U/L (ref 15–41)
Anion gap: 7 (ref 5–15)
BILIRUBIN TOTAL: 1.1 mg/dL (ref 0.3–1.2)
BUN: 10 mg/dL (ref 6–20)
CALCIUM: 8.9 mg/dL (ref 8.9–10.3)
CO2: 23 mmol/L (ref 22–32)
CREATININE: 0.73 mg/dL (ref 0.44–1.00)
Chloride: 110 mmol/L (ref 101–111)
GFR calc Af Amer: >60 mL/min (ref >60)
GFR calc non Af Amer: >60 mL/min (ref >60)
Glucose, Bld: 100 mg/dL — ABNORMAL HIGH (ref 65–99)
Potassium: 3.4 mmol/L — ABNORMAL LOW (ref 3.5–5.1)
Sodium: 140 mmol/L (ref 135–145)
Total Protein: 7.3 g/dL (ref 6.5–8.1)

## 2017-06-16 LAB — CBC WITH DIFFERENTIAL/PLATELET
Basophils Absolute: 0 K/uL (ref 0.0–0.1)
Basophils Relative: 0 %
Eosinophils Absolute: 0.3 K/uL (ref 0.0–0.7)
Eosinophils Relative: 4 %
HCT: 39.7 % (ref 36.0–46.0)
Hemoglobin: 12.4 g/dL (ref 12.0–15.0)
Lymphocytes Relative: 27 %
Lymphs Abs: 2 K/uL (ref 0.7–4.0)
MCH: 27.3 pg (ref 26.0–34.0)
MCHC: 31.2 g/dL (ref 30.0–36.0)
MCV: 87.4 fL (ref 78.0–100.0)
Monocytes Absolute: 0.4 K/uL (ref 0.1–1.0)
Monocytes Relative: 5 %
Neutro Abs: 4.7 K/uL (ref 1.7–7.7)
Neutrophils Relative %: 64 %
Platelets: 162 K/uL (ref 150–400)
RBC: 4.54 MIL/uL (ref 3.87–5.11)
RDW: 15.1 % (ref 11.5–15.5)
WBC: 7.3 K/uL (ref 4.0–10.5)

## 2017-06-16 NOTE — ED Triage Notes (Signed)
Patient reports persistent dry cough with occasional diarrhea onset this week , denies fever or chills , pt. added mild numbness at her left 5th/4th finger.

## 2017-06-17 ENCOUNTER — Emergency Department (HOSPITAL_COMMUNITY): Payer: BLUE CROSS/BLUE SHIELD

## 2017-06-17 ENCOUNTER — Other Ambulatory Visit: Payer: Self-pay | Admitting: Internal Medicine

## 2017-06-17 MED ORDER — BENZONATATE 100 MG PO CAPS
100.0000 mg | ORAL_CAPSULE | Freq: Once | ORAL | Status: AC
  Administered 2017-06-17: 100 mg via ORAL
  Filled 2017-06-17: qty 1

## 2017-06-17 MED ORDER — BENZONATATE 100 MG PO CAPS: 100.0000 mg | ORAL_CAPSULE | Freq: Three times a day (TID) | ORAL | 0 refills | Status: DC | PRN

## 2017-06-17 NOTE — ED Provider Notes (Signed)
[facility] DEPT Provider Note   CSN: [id number] Arrival date & time: 06/16/17  1853     History   Chief Complaint Chief Complaint  Patient presents with  . Cough  . Diarrhea    HPI Brittney Ross is a 56 y.o. female.  HPI  This is a 56 year old female with a history of hypertension, hyperlipidemia, obesity who presents with cough. Patient reports dry cough for one week. She reports that she feels short of breath with cough. No fevers.  Denies chest pain. Denies any upper respiratory symptoms including congestion, rhinorrhea, ear pain, sore throat. She does endorse some nonbloody diarrhea. No vomiting or abdominal pain.  Past Medical History:  Diagnosis Date  . Anemia   . Hyperlipidemia   . Hypertension    Pt states she "doesn't have HTN"  . Morbid obesity ([place])   . Morbid obesity ([place])   . Thyroid disease hyperthyroidism    Patient Active Problem List   Diagnosis Date Noted  . Chronic female pelvic pain 08/14/2011  . GRAVES' DISEASE 03/20/2010  . 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 OBESITY 03/20/2008  . HYPERTENSION, BENIGN ESSENTIAL 03/20/2008  . HYPERCHOLESTEROLEMIA 02/22/2008  . MIGRAINE HEADACHE 02/21/2008  . KNEE PAIN, RIGHT 02/21/2008  . FIBROIDS, UTERUS 02/06/2008  . ANEMIA 02/06/2008    Past Surgical History:  Procedure Laterality Date  . CESAREAN SECTION  12/07/79  . CESAREAN SECTION  05/17/81  . CESAREAN SECTION  09/06/89  . RATH/BSO/cystoscopy    . TUBAL LIGATION      OB History    Gravida Para Term Preterm AB Living   3 3 3  0 0 3   SAB TAB Ectopic Multiple Live Births   0 0 0 0 3       Home Medications    Prior to Admission medications   Medication Sig Start Date End Date Taking? Authorizing Provider  levothyroxine (SYNTHROID, LEVOTHROID) 125 MCG tablet Take 1 tablet (125 mcg total) by mouth daily. 06/15/17  Yes Philemon Kingdom, MD  naproxen (NAPROSYN) 500 MG tablet Take 500 mg by mouth 2 (two) times daily. 03/20/17 03/20/18 Yes  [provider]  benzonatate (TESSALON) 100 MG capsule Take 1 capsule (100 mg total) by mouth 3 (three) times daily as needed for cough. 06/17/17   Horton, Barbette Hair, MD    Family History Family History  Problem Relation Age of Onset  . Hypertension Father   . Heart disease Mother   . Hypertension Sister     Social History Social History  Substance Use Topics  . Smoking status: Never Smoker  . Smokeless tobacco: Never Used  . Alcohol use No     Allergies   Patient has no known allergies.   Review of Systems Review of Systems  Constitutional: Negative for fever.  HENT: Negative for congestion.   Respiratory: Positive for cough and shortness of breath.   Cardiovascular: Negative for chest pain.  Gastrointestinal: Positive for diarrhea. Negative for abdominal pain, nausea and vomiting.  All other systems reviewed and are negative.    Physical Exam Updated Vital Signs BP (!) 150/80   Pulse 87   Temp 98.7 F (37.1 C) (Oral)   Resp 18   Ht 5\' 6"  (1.676 m)   Wt (!) 145.2 kg (320 lb)   LMP 10/28/2011   SpO2 97%   BMI 51.65 kg/m   Physical Exam  Constitutional: She is oriented to person, place, and time. She appears well-developed and well-nourished.  obese  HENT:  Head: Normocephalic  and atraumatic.  Cardiovascular: Normal rate, regular rhythm and normal heart sounds.   No murmur heard. Pulmonary/Chest: Effort normal and breath sounds normal. No respiratory distress. She has no wheezes.  Abdominal: Soft. Bowel sounds are normal. There is no tenderness.  Musculoskeletal: She exhibits edema.  Trace bilateral lower extremity edema  Neurological: She is alert and oriented to person, place, and time.  Skin: Skin is warm and dry.  Psychiatric: She has a normal mood and affect.  Nursing note and vitals reviewed.    ED Treatments / Results  Labs (all labs ordered are listed, but only abnormal results are displayed) Labs Reviewed  COMPREHENSIVE  METABOLIC PANEL - Abnormal; Notable for the following:       Result Value   Potassium 3.4 (*)    Glucose, Bld 100 (*)    Albumin 3.4 (*)    All other components within normal limits  CBC WITH DIFFERENTIAL/PLATELET    EKG  EKG Interpretation  Date/Time:  Wednesday June 17 2017 00:53:25 EDT Ventricular Rate:  85 PR Interval:    QRS Duration: 101 QT Interval:  408 QTC Calculation: 486 R Axis:   17 Text Interpretation:  Sinus rhythm Left atrial enlargement Left ventricular hypertrophy Borderline prolonged QT interval Similar to prior Confirmed by Thayer Jew [phone]) on 06/17/2017 2:33:36 AM       Radiology Dg Chest 2 View  Result Date: 06/17/2017 CLINICAL DATA:  Nonproductive cough and dyspnea, onset 1 week ago. EXAM: CHEST  2 VIEW COMPARISON:  10/09/2016. FINDINGS: Moderately large cardiac silhouette. The lungs are clear. The pulmonary vasculature is normal. No pleural effusion. Unremarkable hilar and mediastinal contours. IMPRESSION: Moderate enlargement of the cardiac silhouette, probably unchanged from 10/09/2016. No consolidation. No pleural effusion Electronically Signed   By: Andreas Newport M.D.   On: 06/17/2017 02:00    Procedures Procedures (including critical care time)  Medications Ordered in ED Medications  benzonatate (TESSALON) capsule 100 mg (100 mg Oral Given 06/17/17 0046)     Initial Impression / Assessment and Plan / ED Course  I have reviewed the triage vital signs and the nursing notes.  Pertinent labs & imaging results that were available during my care of the patient were reviewed by me and considered in my medical decision making (see chart for details).     Patient presents with cough 1 week. Nontoxic-appearing. Afebrile.exam is otherwise benign. Basic labwork is reassuring. Chest x-ray shows no evidence of pneumonia or pulmonary edema. There is mild enlargement of cardiac silhouette which is unchanged from prior. EKG is nonischemic. Patient  improved with Tessalon Perles. Will discharge with Tessalon Perles. Follow-up with primary care recommended if worsening.  After history, exam, and medical workup I feel the patient has been appropriately medically screened and is safe for discharge home. Pertinent diagnoses were discussed with the patient. Patient was given return precautions.   Final Clinical Impressions(s) / ED Diagnoses   Final diagnoses:  Cough    New Prescriptions New Prescriptions   BENZONATATE (TESSALON) 100 MG CAPSULE    Take 1 capsule (100 mg total) by mouth 3 (three) times daily as needed for cough.     Merryl Hacker, MD 06/17/17 [phone]

## 2017-06-17 NOTE — Discharge Instructions (Signed)
Your workup is reassuring. No signs or symptoms of pneumonia at this time. Follow-up with youir primary physician if you do not have any improvement.

## 2017-06-21 ENCOUNTER — Emergency Department (HOSPITAL_COMMUNITY)
Admission: EM | Admit: 2017-06-21 | Discharge: 2017-06-21 | Disposition: A | Discharge status: Home / Self Care | Payer: BLUE CROSS/BLUE SHIELD | Attending: Emergency Medicine | Admitting: Emergency Medicine

## 2017-06-21 ENCOUNTER — Encounter (HOSPITAL_COMMUNITY): Payer: Self-pay | Admitting: *Deleted

## 2017-06-21 DIAGNOSIS — I10 Essential (primary) hypertension: Secondary | ICD-10-CM | POA: Diagnosis not present

## 2017-06-21 DIAGNOSIS — J40 Bronchitis, not specified as acute or chronic: Secondary | ICD-10-CM | POA: Insufficient documentation

## 2017-06-21 DIAGNOSIS — R0602 Shortness of breath: Secondary | ICD-10-CM | POA: Diagnosis present

## 2017-06-21 DIAGNOSIS — E039 Hypothyroidism, unspecified: Secondary | ICD-10-CM | POA: Diagnosis not present

## 2017-06-21 MED ORDER — ALBUTEROL SULFATE HFA 108 (90 BASE) MCG/ACT IN AERS
2.0000 | INHALATION_SPRAY | RESPIRATORY_TRACT | Status: DC | PRN
Start: 2017-06-21 — End: 2017-06-21
  Administered 2017-06-21: 2 via RESPIRATORY_TRACT
  Filled 2017-06-21: qty 6.7

## 2017-06-21 MED ORDER — ALBUTEROL SULFATE (2.5 MG/3ML) 0.083% IN NEBU
2.5000 mg | INHALATION_SOLUTION | Freq: Once | RESPIRATORY_TRACT | Status: AC
  Administered 2017-06-21: 2.5 mg via RESPIRATORY_TRACT
  Filled 2017-06-21: qty 3

## 2017-06-21 MED ORDER — AEROCHAMBER PLUS W/MASK MISC
1.0000 | Freq: Once | Status: AC
  Administered 2017-06-21: 1
  Filled 2017-06-21: qty 1

## 2017-06-21 NOTE — ED Notes (Signed)
Pt states she was seen at [facility] and dx with viral URI, felt worse today, would like a recheck. Cough with occ productive secretions that are clear.No noted distress on assessment.

## 2017-06-21 NOTE — ED Provider Notes (Signed)
[facility] DEPT Provider Note   CSN: [id number] Arrival date & time: 06/21/17  1037     History   Chief Complaint Chief Complaint  Patient presents with  . Shortness of Breath    HPI Brittney Ross is a 55 y.o. female.  HPI Complains of cough, nonproductive with shortness of breath worse with exertion gradual onset 6 days ago. No fever. Symptoms improved with rest. Treated with Tessalon Perles, without relief. She denies orthopnea She was seen at [facility] emergency department 06/17/2017 for the same complaint. Had CBC which was normal, seeing that remarkable for potassium 3.4 otherwise normal and chest x-ray read as moderate enlargement of cardiac silhouette otherwise normal Past Medical History:  Diagnosis Date  . Anemia   . Hyperlipidemia   . Hypertension    Pt states she "doesn't have HTN"  . Morbid obesity ([place])   . Morbid obesity ([place])   . Thyroid disease hyperthyroidism    Patient Active Problem List   Diagnosis Date Noted  . Chronic female pelvic pain 08/14/2011  . GRAVES' DISEASE 03/20/2010  . 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 OBESITY 03/20/2008  . HYPERTENSION, BENIGN ESSENTIAL 03/20/2008  . HYPERCHOLESTEROLEMIA 02/22/2008  . MIGRAINE HEADACHE 02/21/2008  . KNEE PAIN, RIGHT 02/21/2008  . FIBROIDS, UTERUS 02/06/2008  . ANEMIA 02/06/2008    Past Surgical History:  Procedure Laterality Date  . CESAREAN SECTION  12/07/79  . CESAREAN SECTION  05/17/81  . CESAREAN SECTION  09/06/89  . RATH/BSO/cystoscopy    . TUBAL LIGATION      OB History    Gravida Para Term Preterm AB Living   3 3 3  0 0 3   SAB TAB Ectopic Multiple Live Births   0 0 0 0 3       Home Medications    Prior to Admission medications   Medication Sig Start Date End Date Taking? Authorizing Provider  benzonatate (TESSALON) 100 MG capsule Take 1 capsule (100 mg total) by mouth 3 (three) times daily as needed for cough. 06/17/17   Horton, Barbette Hair, MD  levothyroxine  (SYNTHROID, LEVOTHROID) 125 MCG tablet Take 1 tablet (125 mcg total) by mouth daily. 06/15/17   Philemon Kingdom, MD  naproxen (NAPROSYN) 500 MG tablet Take 500 mg by mouth 2 (two) times daily. 03/20/17 03/20/18  [provider]    Family History Family History  Problem Relation Age of Onset  . Hypertension Father   . Heart disease Mother   . Hypertension Sister     Social History Social History  Substance Use Topics  . Smoking status: Never Smoker  . Smokeless tobacco: Never Used  . Alcohol use No     Allergies   Patient has no known allergies.   Review of Systems Review of Systems  Constitutional: Negative.   HENT: Negative.   Respiratory: Positive for cough and shortness of breath.   Cardiovascular: Negative.   Gastrointestinal: Negative.   Musculoskeletal: Negative.   Skin: Negative.   Neurological: Negative.   Psychiatric/Behavioral: Negative.   All other systems reviewed and are negative.    Physical Exam Updated Vital Signs BP (!) 145/93 (BP Location: Right Arm)   Pulse 83   Temp 98.2 F (36.8 C) (Oral)   Resp 18   Ht 5\' 6"  (1.676 m)   Wt (!) 145.2 kg (320 lb)   LMP 10/28/2011   SpO2 95%   BMI 51.65 kg/m   Physical Exam  Constitutional: She appears well-developed and well-nourished.  HENT:  Head:  Normocephalic and atraumatic.  Eyes: Pupils are equal, round, and reactive to light. Conjunctivae are normal.  Neck: Neck supple. No JVD present. No tracheal deviation present. No thyromegaly present.  Cardiovascular: Normal rate and regular rhythm.   No murmur heard. Pulmonary/Chest: Effort normal and breath sounds normal.  Abdominal: Soft. Bowel sounds are normal. She exhibits no distension. There is no tenderness.  Musculoskeletal: Normal range of motion. She exhibits no edema or tenderness.  Neurological: She is alert. Coordination normal.  Skin: Skin is warm and dry. No rash noted.  Psychiatric: She has a normal mood and affect.  Nursing  note and vitals reviewed.    ED Treatments / Results  Labs (all labs ordered are listed, but only abnormal results are displayed) Labs Reviewed - No data to display  EKG  EKG Interpretation None       Radiology No results found.  Procedures Procedures (including critical care time)  Medications Ordered in ED Medications  albuterol (PROVENTIL) (2.5 MG/3ML) 0.083% nebulizer solution 2.5 mg (2.5 mg Nebulization Given 06/21/17 1502)     Initial Impression / Assessment and Plan / ED Course  I have reviewed the triage vital signs and the nursing notes.  Pertinent labs & imaging results that were available during my care of the patient were reviewed by me and considered in my medical decision making (see chart for details).    She reports she is breathing normally after albuterol nebulized treatment. Plan albuterol HFA with spacer to go to use 2 puffs every 4 hours if her cough or shortness of breath. Referral primary care. Blood pressure recheck 3 weeks Final Clinical Impressions(s) / ED Diagnoses  Diagnosis#1 acute bronchitis #2 elevated blood pressure Final diagnoses:  None    New Prescriptions New Prescriptions   No medications on file     Orlie Dakin, MD 06/21/17 [phone]

## 2017-06-21 NOTE — ED Notes (Signed)
Pt verbalized understanding on how to use the inhaler and spacer.

## 2017-06-21 NOTE — Discharge Instructions (Signed)
Use your inhaler 2 puffs every 4 hours as needed for cough or shortness of breath. Your blood pressure should be rechecked in 3 weeks. Today's was mildly elevated at 145/93. Call any of the numbers on these discharge instructions to get a primary care physician

## 2017-06-21 NOTE — ED Triage Notes (Signed)
Patient is alert and oriented x4.  She is being seen for shortness of breath after being seen earlier in the week for URI at [facility].  Patient states that she was told she had a virus and that it would resolve it self and given tessalon pearls but the patient states that she feels worse.  Patient states that she only has chest pain when she coughs.

## 2017-06-24 ENCOUNTER — Ambulatory Visit (INDEPENDENT_AMBULATORY_CARE_PROVIDER_SITE_OTHER): Payer: BLUE CROSS/BLUE SHIELD | Admitting: Internal Medicine

## 2017-06-24 ENCOUNTER — Encounter: Payer: Self-pay | Admitting: Internal Medicine

## 2017-06-24 VITALS — BP 140/88 | HR 95 | Ht 65.5 in | Wt 295.0 lb

## 2017-06-24 DIAGNOSIS — E890 Postprocedural hypothyroidism: Secondary | ICD-10-CM | POA: Diagnosis not present

## 2017-06-24 DIAGNOSIS — Z8639 Personal history of other endocrine, nutritional and metabolic disease: Secondary | ICD-10-CM

## 2017-06-24 DIAGNOSIS — IMO0001 Reserved for inherently not codable concepts without codable children: Secondary | ICD-10-CM

## 2017-06-24 DIAGNOSIS — E669 Obesity, unspecified: Secondary | ICD-10-CM | POA: Diagnosis not present

## 2017-06-24 DIAGNOSIS — Z6841 Body Mass Index (BMI) 40.0 and over, adult: Secondary | ICD-10-CM | POA: Diagnosis not present

## 2017-06-24 LAB — T4, FREE: FREE T4: 1.35 ng/dL (ref 0.60–1.60)

## 2017-06-24 LAB — TSH: TSH: 3.75 u[IU]/mL (ref 0.35–4.50)

## 2017-06-24 NOTE — Progress Notes (Signed)
Patient ID: Brittney Ross, female   DOB: 1961-02-24, 56 y.o.   MRN: [id number]   HPI  Brittney Ross is a 56 y.o.-year-old female, returning for h/o Graves Ds, with post-surgical hypothyroidism. Last visit 8 mo ago.   She has a h/o noncompliance with her LT4 dosing and her appts.  She had bronchitis in last week >> ED. No steroids, ABx, just given an inhaler. She has + cough. No fever.  Reviewed and addended hx Pt. has been dx with Graves Ds. in 03/2010. She tells me that she was feeling poorly and lost 50 lbs before dx.   03/29/2010: Thyroid ultrasound: inhomogeneous and nodular thyroid, only a single solid nodule measured in left upper lobe, measuring 7 x 4 x 5 cm. Otherwise, small hypo-echogenic nodules.  04/17/2010: Thyroid uptake and scan: uptake 60.7% (10-35%), relatively homogeneous, without focal lesions  After dx of Graves Ds, he was seen by Dr. Janey Ross (endocrinologist) who performed RAI ablation in 05/24/2013 >> developed uncontrolled postablation hypothyroidism.  Latest levels: Lab Results  Component Value Date   TSH 1.01 03/02/2017   TSH 12.72 (H) 12/25/2016   TSH 28.74 (H) 10/17/2016   TSH 36.51 (H) 06/29/2015   TSH 6.26 (H) 05/10/2013   TSH 0.005 (L) 03/25/2010   TSH 0.020 (L) 01/17/2009   TSH 1.482 02/21/2008   FREET4 1.68 (H) 03/02/2017   FREET4 0.85 12/25/2016   FREET4 0.60 10/17/2016   FREET4 0.38 (L) 06/29/2015   FREET4 0.54 (L) 05/10/2013   FREET4 2.34 (H) 03/26/2010   Pt is on levothyroxine 125 mcg daily (dose increased 12/2016), taken: - every day - in am - fasting - at least 30 min from b'fast - no Ca, Fe, MVI, PPIs - not on Biotin  Pt denies: - feeling nodules in neck - hoarseness - dysphagia - choking - SOB with lying down  She also has a history of Hypertension, hyperlipidemia, anemia - fibroids - status post hysterectomy 11/02/2011.  ROS: Constitutional: + weight loss, no fatigue, no subjective hyperthermia, no subjective  hypothermia Eyes: no blurry vision, no xerophthalmia ENT: no sore throat, + see HPI Cardiovascular: no CP/+ SOB/no palpitations/no leg swelling Respiratory: + cough/+ SOB/no wheezing Gastrointestinal: no N/no V/no D/no C/no acid reflux Musculoskeletal: no muscle aches/no joint aches Skin: no rashes, no hair loss Neurological: no tremors/no numbness/no tingling/no dizziness  I reviewed pt's medications, allergies, PMH, social hx, family hx, and changes were documented in the history of present illness. Otherwise, unchanged from my initial visit note.  Past Medical History:  Diagnosis Date  . Anemia   . Hyperlipidemia   . Hypertension    Pt states she "doesn't have HTN"  . Morbid obesity ([place])   . Morbid obesity ([place])   . Thyroid disease hyperthyroidism   Past Surgical History:  Procedure Laterality Date  . CESAREAN SECTION  12/07/79  . CESAREAN SECTION  05/17/81  . CESAREAN SECTION  09/06/89  . RATH/BSO/cystoscopy    . TUBAL LIGATION     History   Social History  . Marital Status: Married    Spouse Name: N/A    Number of Children: 3   Occupational History  . 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 Smoking status: Never Smoker   . Smokeless tobacco: Not on file  . Alcohol Use: No  . Drug Use: No  . Sexually Active: Yes    Birth Control/ Protection: Surgical   Current Outpatient Prescriptions  Medication Sig Dispense Refill  .  ALBUTEROL IN Inhale into the lungs.    Marland Kitchen levothyroxine (SYNTHROID, LEVOTHROID) 125 MCG tablet Take 1 tablet (125 mcg total) by mouth daily. 90 tablet 1  . naproxen (NAPROSYN) 500 MG tablet Take 500 mg by mouth 2 (two) times daily.    . benzonatate (TESSALON) 100 MG capsule Take 1 capsule (100 mg total) by mouth 3 (three) times daily as needed for cough. (Patient not taking: Reported on 06/24/2017) 21 capsule 0   No current facility-administered medications for this visit.    No Known Allergies  Family History  Problem  Relation Age of Onset  . Hypertension Father   . Heart disease Mother   . Hypertension Sister    PE: BP 140/88   Pulse 95   Ht 5' 5.5" (1.664 m)   Wt 295 lb (133.8 kg)   LMP 10/28/2011   SpO2 96%   BMI 48.34 kg/m  Body mass index is 48.34 kg/m. Wt Readings from Last 3 Encounters:  06/24/17 295 lb (133.8 kg)  06/21/17 (!) 320 lb (145.2 kg)  06/16/17 (!) 320 lb (145.2 kg)   Constitutional: obese, in NAD Eyes: PERRLA, EOMI, no exophthalmos ENT: moist mucous membranes, no thyromegaly, no cervical lymphadenopathy Cardiovascular: RRR (no tachycardia by the end of the appt), No MRG Respiratory: CTA B Gastrointestinal: abdomen soft, NT, ND, BS+ Musculoskeletal: no deformities, strength intact in all 4 Skin: moist, warm, no rashes Neurological: no tremor with outstretched hands, DTR normal in all 4  ASSESSMENT: 1. History of Graves' disease - Status post radioactive iodine ablation 05/2010  2. Hypothyroidism - postablative  3. Obesity BMI Classification:  < 18.5 underweight   18.5-24.9 normal weight   25.0-29.9 overweight   30.0-34.9 class I obesity   35.0-39.9 class II obesity   ? 40.0 class III obesity   PLAN:  1. and 2. Patient with previous history of Graves' disease, status post RAI ablation in 2011, started on levothyroxine afterwards.  We had pbs with medications and  visits noncompliance >> abnormal TFTs >> we adjusted the doses and I discussed with her repeatedly about consequences of noncompliance with LT4 >> now on 125 mcg daily. She now mentions she is not skipping doses. - latest thyroid labs reviewed with pt >> finally normal! - she continues on LT4 125 mcg daily - pt feels good on this dose. - she has no signs of Graves ophthalmopathy >> will not check her TSI Abs for now - we discussed about taking the thyroid hormone every day, with water, >30 minutes before breakfast, separated by >4 hours from acid reflux medications, calcium, iron, multivitamins.  Pt. is taking it correctly. - will check thyroid tests today: TSH and fT4 - If labs are abnormal, she will need to return for repeat TFTs in 1.5 months - OTW, RTC in 6 mo  2. Obesity class 3 - lost ~ 20 lbs since last visit >> I congratulated her - likely 2/2 normalizing thyroid status - would want to make sure she is not thyrotoxic >> check TFTs today >> may need reduction in dose.  Office Visit on 06/24/2017  Component Date Value Ref Range Status  . TSH 06/24/2017 3.75  0.35 - 4.50 uIU/mL Final  . Free T4 06/24/2017 1.35  0.60 - 1.60 ng/dL Final   Comment: Specimens from patients who are undergoing biotin therapy and /or ingesting biotin supplements may contain high levels of biotin.  The higher biotin concentration in these specimens interferes with this Free T4 assay.  Specimens that  contain high levels  of biotin may cause false high results for this Free T4 assay.  Please interpret results in light of the total clinical presentation of the patient.     TFTs remain normal! [place] news!  Philemon Kingdom, MD PhD [facility]

## 2017-06-24 NOTE — Patient Instructions (Signed)
Please stop at the lab.  Continue Levothyroxine 125 mcg daily.  Take the thyroid hormone every day, with water, at least 30 minutes before breakfast, separated by at least 4 hours from: - acid reflux medications - calcium - iron - multivitamins  Please come back for a follow-up appointment in 6 months.  If we end up changing the Levothyroxine dose, we need a repeat set of labs in 5-6 weeks from now.

## 2017-06-25 NOTE — Progress Notes (Signed)
Pt was advised and voiced understanding. 

## 2017-07-05 ENCOUNTER — Emergency Department (HOSPITAL_COMMUNITY): Payer: BLUE CROSS/BLUE SHIELD

## 2017-07-05 ENCOUNTER — Encounter (HOSPITAL_COMMUNITY): Payer: Self-pay | Admitting: Emergency Medicine

## 2017-07-05 ENCOUNTER — Inpatient Hospital Stay (HOSPITAL_COMMUNITY)
Admission: EM | Admit: 2017-07-05 | Discharge: 2017-07-10 | DRG: 287 | Disposition: A | Discharge status: Home / Self Care | Payer: BLUE CROSS/BLUE SHIELD | Attending: Family Medicine | Admitting: Family Medicine

## 2017-07-05 DIAGNOSIS — I420 Dilated cardiomyopathy: Secondary | ICD-10-CM

## 2017-07-05 DIAGNOSIS — F439 Reaction to severe stress, unspecified: Secondary | ICD-10-CM | POA: Diagnosis present

## 2017-07-05 DIAGNOSIS — I5042 Chronic combined systolic (congestive) and diastolic (congestive) heart failure: Secondary | ICD-10-CM

## 2017-07-05 DIAGNOSIS — E890 Postprocedural hypothyroidism: Secondary | ICD-10-CM | POA: Diagnosis present

## 2017-07-05 DIAGNOSIS — R7989 Other specified abnormal findings of blood chemistry: Secondary | ICD-10-CM | POA: Diagnosis present

## 2017-07-05 DIAGNOSIS — Z6841 Body Mass Index (BMI) 40.0 and over, adult: Secondary | ICD-10-CM | POA: Diagnosis not present

## 2017-07-05 DIAGNOSIS — I493 Ventricular premature depolarization: Secondary | ICD-10-CM | POA: Diagnosis present

## 2017-07-05 DIAGNOSIS — R0603 Acute respiratory distress: Secondary | ICD-10-CM | POA: Diagnosis present

## 2017-07-05 DIAGNOSIS — I5041 Acute combined systolic (congestive) and diastolic (congestive) heart failure: Secondary | ICD-10-CM | POA: Diagnosis not present

## 2017-07-05 DIAGNOSIS — N179 Acute kidney failure, unspecified: Secondary | ICD-10-CM | POA: Diagnosis present

## 2017-07-05 DIAGNOSIS — J810 Acute pulmonary edema: Secondary | ICD-10-CM | POA: Diagnosis not present

## 2017-07-05 DIAGNOSIS — Z23 Encounter for immunization: Secondary | ICD-10-CM

## 2017-07-05 DIAGNOSIS — R0602 Shortness of breath: Secondary | ICD-10-CM

## 2017-07-05 DIAGNOSIS — I5021 Acute systolic (congestive) heart failure: Secondary | ICD-10-CM | POA: Diagnosis not present

## 2017-07-05 DIAGNOSIS — I4581 Long QT syndrome: Secondary | ICD-10-CM | POA: Diagnosis present

## 2017-07-05 DIAGNOSIS — D72829 Elevated white blood cell count, unspecified: Secondary | ICD-10-CM | POA: Diagnosis present

## 2017-07-05 DIAGNOSIS — E876 Hypokalemia: Secondary | ICD-10-CM | POA: Diagnosis present

## 2017-07-05 DIAGNOSIS — I10 Essential (primary) hypertension: Secondary | ICD-10-CM | POA: Diagnosis not present

## 2017-07-05 DIAGNOSIS — Z79899 Other long term (current) drug therapy: Secondary | ICD-10-CM | POA: Diagnosis not present

## 2017-07-05 DIAGNOSIS — I110 Hypertensive heart disease with heart failure: Secondary | ICD-10-CM | POA: Diagnosis present

## 2017-07-05 DIAGNOSIS — I5043 Acute on chronic combined systolic (congestive) and diastolic (congestive) heart failure: Secondary | ICD-10-CM | POA: Diagnosis present

## 2017-07-05 DIAGNOSIS — J45909 Unspecified asthma, uncomplicated: Secondary | ICD-10-CM | POA: Diagnosis present

## 2017-07-05 DIAGNOSIS — R0600 Dyspnea, unspecified: Secondary | ICD-10-CM | POA: Diagnosis not present

## 2017-07-05 DIAGNOSIS — I2510 Atherosclerotic heart disease of native coronary artery without angina pectoris: Secondary | ICD-10-CM | POA: Diagnosis present

## 2017-07-05 DIAGNOSIS — I509 Heart failure, unspecified: Secondary | ICD-10-CM | POA: Diagnosis present

## 2017-07-05 DIAGNOSIS — E782 Mixed hyperlipidemia: Secondary | ICD-10-CM | POA: Diagnosis not present

## 2017-07-05 HISTORY — DX: Chronic combined systolic (congestive) and diastolic (congestive) heart failure: I50.42

## 2017-07-05 LAB — CBC WITH DIFFERENTIAL/PLATELET
BASOS ABS: 0 10*3/uL (ref 0.0–0.1)
Basophils Relative: 0 %
EOS PCT: 1 %
Eosinophils Absolute: 0.1 10*3/uL (ref 0.0–0.7)
HEMATOCRIT: 38.4 % (ref 36.0–46.0)
Hemoglobin: 12.3 g/dL (ref 12.0–15.0)
LYMPHS PCT: 17 %
Lymphs Abs: 2.1 10*3/uL (ref 0.7–4.0)
MCH: 27.8 pg (ref 26.0–34.0)
MCHC: 32 g/dL (ref 30.0–36.0)
MCV: 86.9 fL (ref 78.0–100.0)
Monocytes Absolute: 1 10*3/uL (ref 0.1–1.0)
Monocytes Relative: 8 %
NEUTROS ABS: 9.2 10*3/uL — AB (ref 1.7–7.7)
Neutrophils Relative %: 74 %
PLATELETS: 182 10*3/uL (ref 150–400)
RBC: 4.42 MIL/uL (ref 3.87–5.11)
RDW: 15 % (ref 11.5–15.5)
WBC: 12.4 10*3/uL — AB (ref 4.0–10.5)

## 2017-07-05 LAB — BASIC METABOLIC PANEL
ANION GAP: 11 (ref 5–15)
BUN: 13 mg/dL (ref 6–20)
CO2: 21 mmol/L — ABNORMAL LOW (ref 22–32)
Calcium: 9.1 mg/dL (ref 8.9–10.3)
Chloride: 106 mmol/L (ref 101–111)
Creatinine, Ser: 0.71 mg/dL (ref 0.44–1.00)
Glucose, Bld: 107 mg/dL — ABNORMAL HIGH (ref 65–99)
POTASSIUM: 3.4 mmol/L — AB (ref 3.5–5.1)
SODIUM: 138 mmol/L (ref 135–145)

## 2017-07-05 LAB — BRAIN NATRIURETIC PEPTIDE: B NATRIURETIC PEPTIDE 5: 953.6 pg/mL — AB (ref 0.0–100.0)

## 2017-07-05 MED ORDER — ACETAMINOPHEN 325 MG PO TABS: 650.0000 mg | ORAL_TABLET | ORAL | Status: DC | PRN

## 2017-07-05 MED ORDER — ALPRAZOLAM 0.25 MG PO TABS: 0.2500 mg | ORAL_TABLET | Freq: Two times a day (BID) | ORAL | Status: DC | PRN

## 2017-07-05 MED ORDER — LISINOPRIL 5 MG PO TABS
2.5000 mg | ORAL_TABLET | Freq: Every day | ORAL | Status: DC
  Administered 2017-07-07 – 2017-07-09 (×3): 2.5 mg via ORAL
  Filled 2017-07-05 (×3): qty 1

## 2017-07-05 MED ORDER — SODIUM CHLORIDE 0.9% FLUSH
3.0000 mL | Freq: Two times a day (BID) | INTRAVENOUS | Status: DC
Start: 2017-07-05 — End: 2017-07-10
  Administered 2017-07-05 – 2017-07-09 (×7): 3 mL via INTRAVENOUS

## 2017-07-05 MED ORDER — FUROSEMIDE 10 MG/ML IJ SOLN
40.0000 mg | Freq: Two times a day (BID) | INTRAMUSCULAR | Status: DC
  Administered 2017-07-06 – 2017-07-08 (×5): 40 mg via INTRAVENOUS
  Filled 2017-07-05 (×5): qty 4

## 2017-07-05 MED ORDER — ORAL CARE MOUTH RINSE
15.0000 mL | Freq: Two times a day (BID) | OROMUCOSAL | Status: DC
  Administered 2017-07-07 – 2017-07-09 (×5): 15 mL via OROMUCOSAL

## 2017-07-05 MED ORDER — PNEUMOCOCCAL VAC POLYVALENT 25 MCG/0.5ML IJ INJ
0.5000 mL | INJECTION | INTRAMUSCULAR | Status: AC
  Administered 2017-07-07: 0.5 mL via INTRAMUSCULAR
  Filled 2017-07-05: qty 0.5

## 2017-07-05 MED ORDER — FUROSEMIDE 10 MG/ML IJ SOLN
40.0000 mg | Freq: Once | INTRAMUSCULAR | Status: AC
  Administered 2017-07-05: 40 mg via INTRAVENOUS
  Filled 2017-07-05: qty 4

## 2017-07-05 MED ORDER — SODIUM CHLORIDE 0.9 % IV SOLN: 250.0000 mL | INTRAVENOUS | Status: DC | PRN

## 2017-07-05 MED ORDER — HYDRALAZINE HCL 20 MG/ML IJ SOLN: 10.0000 mg | INTRAMUSCULAR | Status: DC | PRN

## 2017-07-05 MED ORDER — ALBUTEROL SULFATE (2.5 MG/3ML) 0.083% IN NEBU: 3.0000 mL | INHALATION_SOLUTION | Freq: Four times a day (QID) | RESPIRATORY_TRACT | Status: DC | PRN

## 2017-07-05 MED ORDER — ENOXAPARIN SODIUM 40 MG/0.4ML ~~LOC~~ SOLN
40.0000 mg | SUBCUTANEOUS | Status: DC
  Administered 2017-07-05 – 2017-07-08 (×4): 40 mg via SUBCUTANEOUS
  Filled 2017-07-05 (×4): qty 0.4

## 2017-07-05 MED ORDER — CARVEDILOL 6.25 MG PO TABS
6.2500 mg | ORAL_TABLET | Freq: Two times a day (BID) | ORAL | Status: DC
  Administered 2017-07-07 – 2017-07-10 (×7): 6.25 mg via ORAL
  Filled 2017-07-05 (×7): qty 1

## 2017-07-05 MED ORDER — POTASSIUM CHLORIDE CRYS ER 20 MEQ PO TBCR
30.0000 meq | EXTENDED_RELEASE_TABLET | Freq: Two times a day (BID) | ORAL | Status: DC
  Administered 2017-07-05 – 2017-07-07 (×5): 30 meq via ORAL
  Filled 2017-07-05 (×5): qty 1

## 2017-07-05 MED ORDER — ONDANSETRON HCL 4 MG/2ML IJ SOLN: 4.0000 mg | Freq: Four times a day (QID) | INTRAMUSCULAR | Status: DC | PRN

## 2017-07-05 MED ORDER — LEVOTHYROXINE SODIUM 25 MCG PO TABS
125.0000 ug | ORAL_TABLET | Freq: Every day | ORAL | Status: DC
  Administered 2017-07-06 – 2017-07-10 (×5): 125 ug via ORAL
  Filled 2017-07-05 (×5): qty 1

## 2017-07-05 MED ORDER — SODIUM CHLORIDE 0.9% FLUSH: 3.0000 mL | INTRAVENOUS | Status: DC | PRN

## 2017-07-05 MED ORDER — HYDROCODONE-ACETAMINOPHEN 5-325 MG PO TABS
1.0000 | ORAL_TABLET | ORAL | Status: DC | PRN
Start: 2017-07-05 — End: 2017-07-10
  Administered 2017-07-08 – 2017-07-09 (×2): 1 via ORAL
  Filled 2017-07-05 (×2): qty 1

## 2017-07-05 NOTE — ED Triage Notes (Signed)
Pt from home with c/o cough and SOB since end of August. Pt reports generalized body aches from coughing. Pt denies fevers. Pt has clear lung sounds and is able to speak in complete sentences. Pt maintaining oxygen sat at 92% RA while speaking. Pt states she has been taking tylenol cold at home for symptoms.

## 2017-07-05 NOTE — ED Provider Notes (Signed)
[facility] DEPT Provider Note   CSN: [id number] Arrival date & time: 07/05/17  1258     History   Chief Complaint Chief Complaint  Patient presents with  . Shortness of Breath  . Cough    HPI Brittney Ross is a 56 y.o. female w PMHx hyperthyroid, HLD, HTN, presenting with worsening SOB that began the end of July. SOB is worse on exertion and slightly improved with rest, however not resolved with rest. She was seen in ED twice for this complaint on 06/17/17 as well as 06/21/17. Her CXR on 06/17/17 showed stable cardiomegaly without acute pathology. She states today symptoms became much worse. She reports assoc "wet" cough productive of clear mucus. Denies CP, F/C, congestion, abd pain, N/V, LE edema, or any other symptoms today. Denies hx of smoking, COPD, or asthma. No hx is cardiac stress test or echo. She does not have a PCP or a cardiologist, only an endocrinologist for her hyperthyroidism.  The history is provided by the patient.    Past Medical History:  Diagnosis Date  . Anemia   . Hyperlipidemia   . Hypertension    Pt states she "doesn't have HTN"  . Morbid obesity ([place])   . Morbid obesity ([place])   . Thyroid disease hyperthyroidism    Patient Active Problem List   Diagnosis Date Noted  . Hypokalemia 07/05/2017  . Acute CHF (congestive heart failure) ([place]) 07/05/2017  . Chronic female pelvic pain 08/14/2011  . H/O Graves' disease 03/20/2010  . 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 Class 3 obesity in adult 03/20/2008  . HYPERTENSION, BENIGN ESSENTIAL 03/20/2008  . HYPERCHOLESTEROLEMIA 02/22/2008  . MIGRAINE HEADACHE 02/21/2008  . KNEE PAIN, RIGHT 02/21/2008  . FIBROIDS, UTERUS 02/06/2008  . ANEMIA 02/06/2008    Past Surgical History:  Procedure Laterality Date  . CESAREAN SECTION  12/07/79  . CESAREAN SECTION  05/17/81  . CESAREAN SECTION  09/06/89  . RATH/BSO/cystoscopy    . TUBAL LIGATION      OB History    Gravida Para Term Preterm AB Living   3 3  3  0 0 3   SAB TAB Ectopic Multiple Live Births   0 0 0 0 3       Home Medications    Prior to Admission medications   Medication Sig Start Date End Date Taking? Authorizing Provider  albuterol (PROVENTIL HFA;VENTOLIN HFA) 108 (90 Base) MCG/ACT inhaler Inhale 2 puffs into the lungs every 6 (six) hours as needed for wheezing or shortness of breath.   Yes [provider]  levothyroxine (SYNTHROID, LEVOTHROID) 125 MCG tablet Take 1 tablet (125 mcg total) by mouth daily. 06/15/17  Yes Philemon Kingdom, MD  naproxen (NAPROSYN) 500 MG tablet Take 500 mg by mouth 2 (two) times daily as needed for mild pain.  03/20/17 03/20/18 Yes [provider]  pseudoephedrine-acetaminophen (TYLENOL SINUS) 30-500 MG TABS tablet Take 1 tablet by mouth every 4 (four) hours as needed.   Yes [provider]    Family History Family History  Problem Relation Age of Onset  . Hypertension Father   . Heart disease Mother   . Hypertension Sister     Social History Social History  Substance Use Topics  . Smoking status: Never Smoker  . Smokeless tobacco: Never Used  . Alcohol use No     Allergies   Patient has no known allergies.   Review of Systems Review of Systems  Constitutional: Negative for chills and fever.  HENT: Negative for congestion.  Respiratory: Positive for cough and shortness of breath.   Cardiovascular: Negative for chest pain and leg swelling.  Gastrointestinal: Negative for abdominal pain, nausea and vomiting.  All other systems reviewed and are negative.    Physical Exam Updated Vital Signs BP (!) 162/71 (BP Location: Right Arm)   Pulse 96   Temp 98.1 F (36.7 C) (Oral)   Resp (!) 24   Ht 5\' 5"  (1.651 m)   Wt 130 kg (286 lb 9.6 oz)   LMP 10/28/2011   SpO2 96%   BMI 47.69 kg/m   Physical Exam  Constitutional: She appears well-developed and well-nourished.  Morbidly obese. Pt not in distress, sitting comfortably in bed without increased work  of breathing.  HENT:  Head: Normocephalic and atraumatic.  Mouth/Throat: Oropharynx is clear and moist.  Eyes: Pupils are equal, round, and reactive to light. Conjunctivae and EOM are normal.  Neck: Normal range of motion.  Cardiovascular: Normal rate, regular rhythm, normal heart sounds and intact distal pulses.  Exam reveals no friction rub.   No murmur heard. Pulmonary/Chest: Effort normal. No respiratory distress. She has no wheezes. She has rales (diffuse fine crackles). She exhibits no tenderness.  Abdominal: Soft. Bowel sounds are normal. There is no tenderness.  Musculoskeletal:  Trace pitting edema bilateral lower extremities  Neurological: She is alert.  Skin: Skin is warm.  Psychiatric: She has a normal mood and affect. Her behavior is normal.  Nursing note and vitals reviewed.    ED Treatments / Results  Labs (all labs ordered are listed, but only abnormal results are displayed) Labs Reviewed  BRAIN NATRIURETIC PEPTIDE - Abnormal; Notable for the following:       Result Value   B Natriuretic Peptide 953.6 (*)    All other components within normal limits  BASIC METABOLIC PANEL - Abnormal; Notable for the following:    Potassium 3.4 (*)    CO2 21 (*)    Glucose, Bld 107 (*)    All other components within normal limits  CBC WITH DIFFERENTIAL/PLATELET - Abnormal; Notable for the following:    WBC 12.4 (*)    Neutro Abs 9.2 (*)    All other components within normal limits  HIV ANTIBODY (ROUTINE TESTING)  BASIC METABOLIC PANEL  MAGNESIUM    EKG  EKG Interpretation  Date/Time:  Sunday July 05 2017 13:16:33 EDT Ventricular Rate:  99 PR Interval:    QRS Duration: 92 QT Interval:  384 QTC Calculation: 493 R Axis:   13 Text Interpretation:  Sinus tachycardia Ventricular premature complex LAE, consider biatrial enlargement Borderline T wave abnormalities Borderline prolonged QT interval Since last EKG, PVCs are now evident Otherwise no significant change  Confirmed by Duffy Bruce [phone]) on 07/05/2017 5:24:18 PM       Radiology Dg Chest 2 View  Result Date: 07/05/2017 CLINICAL DATA:  Acute shortness of breath. EXAM: CHEST  2 VIEW COMPARISON:  06/17/2017 FINDINGS: Cardiomegaly again noted. Bilateral central airspace opacities are identified, moderate on the right and mild on the left. No definite pleural effusion or pneumothorax. No acute bony abnormalities are identified. IMPRESSION: Bilateral central airspace opacities, right greater than left, favor pulmonary edema over infection. Cardiomegaly. Electronically Signed   By: Margarette Canada M.D.   On: 07/05/2017 13:59    Procedures Procedures (including critical care time)  Medications Ordered in ED Medications  albuterol (PROVENTIL) (2.5 MG/3ML) 0.083% nebulizer solution 3 mL (not administered)  levothyroxine (SYNTHROID, LEVOTHROID) tablet 125 mcg (not administered)  sodium chloride flush (  NS) 0.9 % injection 3 mL (not administered)  sodium chloride flush (NS) 0.9 % injection 3 mL (not administered)  0.9 %  sodium chloride infusion (not administered)  acetaminophen (TYLENOL) tablet 650 mg (not administered)  ondansetron (ZOFRAN) injection 4 mg (not administered)  enoxaparin (LOVENOX) injection 40 mg (not administered)  furosemide (LASIX) injection 40 mg (not administered)  potassium chloride (K-DUR,KLOR-CON) CR tablet 30 mEq (not administered)  ALPRAZolam (XANAX) tablet 0.25 mg (not administered)  HYDROcodone-acetaminophen (NORCO/VICODIN) 5-325 MG per tablet 1-2 tablet (not administered)  lisinopril (PRINIVIL,ZESTRIL) tablet 2.5 mg (not administered)  carvedilol (COREG) tablet 6.25 mg (not administered)  furosemide (LASIX) injection 40 mg (40 mg Intravenous Given 07/05/17 1620)     Initial Impression / Assessment and Plan / ED Course  I have reviewed the triage vital signs and the nursing notes.  Pertinent labs & imaging results that were available during my care of the patient were  reviewed by me and considered in my medical decision making (see chart for details).  Clinical Course as of Jul 05 2100  Nancy Fetter Jul 05, 2017  1843 Spoke with Cardiology, Dr. Wynonia Lawman, who reports they will see patient after consulted by Hospitalists.   [JR]  [id number] Spoke with Dr. Myna Hidalgo, who is accepting admission.  [JR]    Clinical Course User Index [JR] Russo, [place] N, PA-C    Pt presenting with worsening SOB for >49month. On exam, initial o2 sat 90% on RA, lungs with diffuse fine crackles. No increased work of breathing or respiratory distress at rest. O2 sat improved w 2L [place]. CXR consistent with pulmonary edema bilaterally. Trace pitting edema BLE. EKG without ischemic changes. Lasix given. BNP markedly elevated at 953.6. CBC and CMP unremarkable. Given pt's new onset of heart failure and worsening SOB x 1 month, feel admission is appropriate. Consulted cardiology who prefers medical admission and consult from hospitalists. Dr. Myna Hidalgo with [facility] accepting admission.  Patient discussed with Dr. Gilford Raid.  The patient appears reasonably stabilized for admission considering the current resources, flow, and capabilities available in the ED at this time, and I doubt any other [facility] requiring further screening and/or treatment in the ED prior to admission.   Final Clinical Impressions(s) / ED Diagnoses   Final diagnoses:  Shortness of breath  Acute pulmonary edema [facility])    New Prescriptions Current Discharge Medication List       Russo, [place] N, PA-C 07/05/17 2102    Isla Pence, MD 07/08/17 [phone]

## 2017-07-05 NOTE — [facility] Notes (Signed)
Patient had a six beat run of Vtach. MD made aware. Magnesium ordered and administered.

## 2017-07-05 NOTE — [facility] Notes (Signed)
Please call report to [place] at [phone] at [place]. Andre Lefort

## 2017-07-05 NOTE — H&P (Signed)
History and Physical    Brittney Ross VVO:[id number] DOB: 06-29-1961 DOA: 07/05/2017  PCP: Patient, No Pcp Per   Patient coming from: Home  Chief Complaint: SOB, cough  HPI: Brittney Ross is a 56 y.o. female with medical history significant for hypertension and obesity, now presenting to the emergency department with progressive dyspnea. Patient reports that she noted the insidious development of shortness of breath approximately one month ago and has been evaluated in the emergency department twice since then. She had been diagnosed with infection initially, and then asthma, but has continued to worsen despite treatment with antibiotics and inhalers. She denies any fevers or chills. She denies chest pain or palpitations. She does endorse orthopnea. She reports a mild cough, occasionally productive of scant clear sputum. Denies any significant lower extremity edema.    ED Course: Upon arrival to the ED, patient is found to be afebrile, saturating 0% on room air, tachypneic in the 30s, and with vitals otherwise stable. EKG features a sinus rhythm with PVCs and chest x-rays notable for bilateral central airspace opacities, right greater than left, likely representing edema. Chemistry panel reveals ahas him of 3.4 and CBC is notable for a leukocytosis to 12,400. BNP is elevated to a value of 954. Patient was treated with 40 mg of IV Lasix in the ED and placed on 2 L/m supplemental oxygen. She has remained hemodynamically stable, and in mild respiratory distress, and will be admitted to the telemetry unit for ongoing evaluation and management of uterine respiratory distress, likely secondary to acute CHF.  Review of Systems:  All other systems reviewed and apart from HPI, are negative.  Past Medical History:  Diagnosis Date  . Anemia   . Hyperlipidemia   . Hypertension    Pt states she "doesn't have HTN"  . Morbid obesity ([place])   . Morbid obesity ([place])   . Thyroid disease hyperthyroidism     Past Surgical History:  Procedure Laterality Date  . CESAREAN SECTION  12/07/79  . CESAREAN SECTION  05/17/81  . CESAREAN SECTION  09/06/89  . RATH/BSO/cystoscopy    . TUBAL LIGATION       reports that she has never smoked. She has never used smokeless tobacco. She reports that she does not drink alcohol or use drugs.  No Known Allergies  Family History  Problem Relation Age of Onset  . Hypertension Father   . Heart disease Mother   . Hypertension Sister      Prior to Admission medications   Medication Sig Start Date End Date Taking? Authorizing Provider  albuterol (PROVENTIL HFA;VENTOLIN HFA) 108 (90 Base) MCG/ACT inhaler Inhale 2 puffs into the lungs every 6 (six) hours as needed for wheezing or shortness of breath.   Yes [provider]  levothyroxine (SYNTHROID, LEVOTHROID) 125 MCG tablet Take 1 tablet (125 mcg total) by mouth daily. 06/15/17  Yes Philemon Kingdom, MD  naproxen (NAPROSYN) 500 MG tablet Take 500 mg by mouth 2 (two) times daily as needed for mild pain.  03/20/17 03/20/18 Yes [provider]  pseudoephedrine-acetaminophen (TYLENOL SINUS) 30-500 MG TABS tablet Take 1 tablet by mouth every 4 (four) hours as needed.   Yes [provider]    Physical Exam: Vitals:   07/05/17 1600 07/05/17 1603 07/05/17 1700 07/05/17 1854  BP: (!) 165/74  (!) 153/90 (!) 143/102  Pulse: 93  95 97  Resp: (!) 25  (!) 25 (!) 29  Temp:      TempSrc:  SpO2: 91% 95% 96% 96%      Constitutional: NAD, calm, dyspneic Eyes: PERTLA, lids and conjunctivae normal ENMT: Mucous membranes are moist. Posterior pharynx clear of any exudate or lesions.   Neck: normal, supple, no masses, no thyromegaly Respiratory: Rales bilaterally. Increased WOB. No pallor or cyanosis.   Cardiovascular: S1 & S2 heard, regular rate and rhythm. Trace bilateral pedal edema. JVP not well-visualized. Abdomen: No distension, no tenderness, no masses palpated. Bowel sounds normal.   Musculoskeletal: no clubbing / cyanosis. No joint deformity upper and lower extremities.    Skin: no significant rashes, lesions, ulcers. Warm, dry, well-perfused. Neurologic: CN 2-12 grossly intact. Sensation intact. Strength 5/5 in all 4 limbs.  Psychiatric: Alert and oriented x 3. Calm, cooperative.     Labs on Admission: I have personally reviewed following labs and imaging studies  CBC:  Recent Labs Lab 07/05/17 1545  WBC 12.4*  NEUTROABS 9.2*  HGB 12.3  HCT 38.4  MCV 86.9  PLT 694   Basic Metabolic Panel:  Recent Labs Lab 07/05/17 1545  NA 138  K 3.4*  CL 106  CO2 21*  GLUCOSE 107*  BUN 13  CREATININE 0.71  CALCIUM 9.1   GFR: Estimated Creatinine Clearance: 110.9 mL/min (by C-G formula based on SCr of 0.71 mg/dL). Liver Function Tests: No results for input(s): AST, ALT, ALKPHOS, BILITOT, PROT, ALBUMIN in the last 168 hours. No results for input(s): LIPASE, AMYLASE in the last 168 hours. No results for input(s): AMMONIA in the last 168 hours. Coagulation Profile: No results for input(s): INR, PROTIME in the last 168 hours. Cardiac Enzymes: No results for input(s): CKTOTAL, CKMB, CKMBINDEX, TROPONINI in the last 168 hours. BNP (last 3 results) No results for input(s): PROBNP in the last 8760 hours. HbA1C: No results for input(s): HGBA1C in the last 72 hours. CBG: No results for input(s): GLUCAP in the last 168 hours. Lipid Profile: No results for input(s): CHOL, HDL, LDLCALC, TRIG, CHOLHDL, LDLDIRECT in the last 72 hours. Thyroid Function Tests: No results for input(s): TSH, T4TOTAL, FREET4, T3FREE, THYROIDAB in the last 72 hours. Anemia Panel: No results for input(s): VITAMINB12, FOLATE, FERRITIN, TIBC, IRON, RETICCTPCT in the last 72 hours. Urine analysis:    Component Value Date/Time   COLORURINE YELLOW 10/09/2016 0913   APPEARANCEUR HAZY (A) 10/09/2016 0913   LABSPEC 1.017 10/09/2016 0913   PHURINE 5.0 10/09/2016 0913   GLUCOSEU NEGATIVE  10/09/2016 0913   HGBUR NEGATIVE 10/09/2016 0913   HGBUR negative 06/13/2010 0000   BILIRUBINUR NEGATIVE 10/09/2016 0913   KETONESUR NEGATIVE 10/09/2016 0913   PROTEINUR NEGATIVE 10/09/2016 0913   UROBILINOGEN 0.2 06/13/2010 0000   NITRITE NEGATIVE 10/09/2016 0913   LEUKOCYTESUR NEGATIVE 10/09/2016 0913   Sepsis Labs: @LABRCNTIP (procalcitonin:4,lacticidven:4) )No results found for this or any previous visit (from the past 240 hour(s)).   Radiological Exams on Admission: Dg Chest 2 View  Result Date: 07/05/2017 CLINICAL DATA:  Acute shortness of breath. EXAM: CHEST  2 VIEW COMPARISON:  06/17/2017 FINDINGS: Cardiomegaly again noted. Bilateral central airspace opacities are identified, moderate on the right and mild on the left. No definite pleural effusion or pneumothorax. No acute bony abnormalities are identified. IMPRESSION: Bilateral central airspace opacities, right greater than left, favor pulmonary edema over infection. Cardiomegaly. Electronically Signed   By: Margarette Canada M.D.   On: 07/05/2017 13:59    EKG: Independently reviewed. Sinus rhythm, PVC's.   Assessment/Plan  1. Acute CHF  - Pt presents with a month of progressive dyspnea despite treatment  with abx and inhalers  - CXR is suggestive of pulmonary edema and BNP is almost 1,000  - No evidence for an infectious or obstructive etiology for her respiratory sxs and acute CHF is likely  - She was placed on supplemental O2 in the ED and given a dose of Lasix 40 mg IV  - Plan to continue cardiac monitoring, continue supplemental O2 as needed, continue diuresis with Lasix 40 mg IV q12h, SLIV, fluid-restrict diet, follow daily wts and I/O's, and obtain echocardiogram    2. Hypothyroidism  - Appears stable - Continue Synthroid   3. Hypokalemia  - Serum potassium is 3.4 on admission - She will be undergoing diuresis with Lasix  - Continue cardiac monitoring, start 30 mEq oral potassium BID, follow daily chem panel during  diuresis   4. Leukocytosis  - WBC elevated to 12,400 on admission with no infectious s/s  - Plan to culture if febrile    DVT prophylaxis: sq Lovenox Code Status: Full  Family Communication: Discussed with patient Disposition Plan: Admit to telemetry Consults called: None Admission status: Inpatient    Vianne Bulls, MD [facility] Pager [phone]  If 7PM-7AM, please contact night-coverage [email or web address] Password [email or web address]  07/05/2017, 7:07 PM

## 2017-07-06 ENCOUNTER — Inpatient Hospital Stay (HOSPITAL_COMMUNITY): Payer: BLUE CROSS/BLUE SHIELD

## 2017-07-06 DIAGNOSIS — R0600 Dyspnea, unspecified: Secondary | ICD-10-CM

## 2017-07-06 LAB — BASIC METABOLIC PANEL
ANION GAP: 10 (ref 5–15)
BUN: 15 mg/dL (ref 6–20)
CHLORIDE: 107 mmol/L (ref 101–111)
CO2: 25 mmol/L (ref 22–32)
Calcium: 9 mg/dL (ref 8.9–10.3)
Creatinine, Ser: 0.8 mg/dL (ref 0.44–1.00)
GFR calc non Af Amer: >60 mL/min (ref >60)
Glucose, Bld: 97 mg/dL (ref 65–99)
POTASSIUM: 3.5 mmol/L (ref 3.5–5.1)
SODIUM: 142 mmol/L (ref 135–145)

## 2017-07-06 LAB — HIV ANTIBODY (ROUTINE TESTING W REFLEX): HIV Screen 4th Generation wRfx: NONREACTIVE

## 2017-07-06 LAB — ECHOCARDIOGRAM COMPLETE
HEIGHTINCHES: 65 in
WEIGHTICAEL: 4560.88 [oz_av]

## 2017-07-06 LAB — MAGNESIUM: MAGNESIUM: 2.2 mg/dL (ref 1.7–2.4)

## 2017-07-06 MED ORDER — POTASSIUM CHLORIDE CRYS ER 20 MEQ PO TBCR: 40.0000 meq | EXTENDED_RELEASE_TABLET | Freq: Every day | ORAL | Status: DC

## 2017-07-06 MED ORDER — MAGNESIUM SULFATE IN D5W 1-5 GM/100ML-% IV SOLN
1.0000 g | Freq: Once | INTRAVENOUS | Status: AC
  Administered 2017-07-06: 1 g via INTRAVENOUS
  Filled 2017-07-06: qty 100

## 2017-07-06 MED ORDER — PERFLUTREN LIPID MICROSPHERE
1.0000 mL | INTRAVENOUS | Status: AC | PRN
  Administered 2017-07-06: 2 mL via INTRAVENOUS
  Filled 2017-07-06 (×2): qty 10

## 2017-07-06 NOTE — [facility] Notes (Signed)
PT Cancellation Note  Patient Details Name: Brittney Ross MRN: [id number] DOB: 1961-06-05   Cancelled Treatment:    Reason Eval/Treat Not Completed: Patient declined, patient reports that she is ambulating ad lib to bathroom, declined ambulation in  [facility]. Will check back tomorrow.   Claretha Cooper 07/06/2017, 1:42 PM

## 2017-07-06 NOTE — [facility] Notes (Signed)
[facility] Progress Note  Patient: Brittney Ross GYI:[id number]   PCP: Patient, No Pcp Per DOB: July 28, 1961   DOA: 07/05/2017   DOS: 07/06/2017   Date of Service: the patient was seen and examined on 07/06/2017  Subjective: No chest pain. Continues to have some cough as well as shortness of breath. No dizziness no lightheadedness. No nausea no vomiting. No fever no chills.  Brief hospital course: Pt. with PMH of hypothyroidism, morbid obesity; admitted on 07/05/2017, presented with complaint of shortness of breath, cough, orthopnea and PND, was found to have acute on chronic CHF, etiology not clear. Currently further plan is continue IV Lasix.  Assessment and Plan: 1. Most likely acute on chronic combined CHF. Morbid obesity Echocardiogram ordered, currently pending. BNP 900 Patient with symptoms of orthopnea, PND as well as dyspnea at rest on the day of presentation. Started on IV Lasix, currently feeling better. Still requiring oxygen. Bilateral basal crackles on examination. EKG unremarkable, no evidence of chest pain. Family history of coronary artery disease in mother. With this cardiology consult for further input. Continue lisinopril. Monitor on telemetry.  2. Hypothyroidism. Continue Synthroid. Check TSH and free T4.  3. Hypokalemia. Replacing.  4. Leukocytosis. Likely stress reaction. Monitor.  Diet: Cardiac diet DVT Prophylaxis: subcutaneous Heparin  Advance goals of care discussion: Full code  Family Communication: family was present at bedside, at the time of interview. The pt provided permission to discuss medical plan with the family. Opportunity was given to ask question and all questions were answered satisfactorily.   Disposition:  Discharge to home.  Consultants: card  Procedures: Echocardiogram   Antibiotics: Anti-infectives    None       Objective: Physical Exam: Vitals:   07/05/17 2352 07/06/17 0500 07/06/17 0608 07/06/17 1400  BP: (!)  141/40  (!) 149/70 127/86  Pulse: 99  92 89  Resp: (!) 26  (!) 24 20  Temp: 99.1 F (37.3 C)  99.7 F (37.6 C) 98.7 F (37.1 C)  TempSrc: Oral  Oral Oral  SpO2: 95%  95% 97%  Weight:  129.3 kg (285 lb 0.9 oz)    Height:        Intake/Output Summary (Last 24 hours) at 07/06/17 1724 Last data filed at 07/06/17 1400  Gross per 24 hour  Intake              520 ml  Output             1300 ml  Net             -780 ml   Filed Weights   07/05/17 2006 07/06/17 0500  Weight: 130 kg (286 lb 9.6 oz) 129.3 kg (285 lb 0.9 oz)   General: Alert, Awake and Oriented to Time, Place and Person. Appear in mild distress, affect appropriate Eyes: PERRL, Conjunctiva normal ENT: Oral Mucosa clear moist. Neck: difficult to assess JVD, no Abnormal Mass Or lumps Cardiovascular: S1 and S2 Present, no Murmur, Peripheral Pulses Present Respiratory: normal respiratory effort, Bilateral Air entry equal and Decreased, no use of accessory muscle, basal Crackles, no wheezes Abdomen: Bowel Sound present, Soft and no tenderness, no hernia Skin: no redness, no Rash, no induration Extremities: trace Pedal edema, no calf tenderness Neurologic: Grossly no focal neuro deficit. Bilaterally Equal motor strength  Data Reviewed: CBC:  Recent Labs Lab 07/05/17 1545  WBC 12.4*  NEUTROABS 9.2*  HGB 12.3  HCT 38.4  MCV 86.9  PLT 350   Basic Metabolic Panel:  Recent Labs Lab 07/05/17 1545 07/06/17 0446  NA 138 142  K 3.4* 3.5  CL 106 107  CO2 21* 25  GLUCOSE 107* 97  BUN 13 15  CREATININE 0.71 0.80  CALCIUM 9.1 9.0  MG  --  2.2    Liver Function Tests: No results for input(s): AST, ALT, ALKPHOS, BILITOT, PROT, ALBUMIN in the last 168 hours. No results for input(s): LIPASE, AMYLASE in the last 168 hours. No results for input(s): AMMONIA in the last 168 hours. Coagulation Profile: No results for input(s): INR, PROTIME in the last 168 hours. Cardiac Enzymes: No results for input(s): CKTOTAL, CKMB,  CKMBINDEX, TROPONINI in the last 168 hours. BNP (last 3 results) No results for input(s): PROBNP in the last 8760 hours. CBG: No results for input(s): GLUCAP in the last 168 hours. Studies: No results found.  Scheduled Meds: . [START ON 07/07/2017] carvedilol  6.25 mg Oral BID WC  . enoxaparin (LOVENOX) injection  40 mg Subcutaneous Q24H  . furosemide  40 mg Intravenous Q12H  . levothyroxine  125 mcg Oral QAC breakfast  . [START ON 07/07/2017] lisinopril  2.5 mg Oral Daily  . mouth rinse  15 mL Mouth Rinse BID  . pneumococcal 23 valent vaccine  0.5 mL Intramuscular Tomorrow-1000  . potassium chloride  30 mEq Oral BID  . sodium chloride flush  3 mL Intravenous Q12H   Continuous Infusions: . sodium chloride     PRN Meds: sodium chloride, acetaminophen, albuterol, ALPRAZolam, hydrALAZINE, HYDROcodone-acetaminophen, ondansetron (ZOFRAN) IV, sodium chloride flush  Time spent: 35 minutes  Author: Berle Mull, MD [facility] Pager: [phone] 07/06/2017 5:24 PM  If 7PM-7AM, please contact night-coverage at [email or web address], password [facility]

## 2017-07-06 NOTE — [facility] Notes (Signed)
Notified MD of patient having frequent pair pvc's

## 2017-07-06 NOTE — [facility] Notes (Signed)
  Echocardiogram 2D Echocardiogram has been performed.  Darrin Koman L Androw 07/06/2017, 3:24 PM

## 2017-07-07 DIAGNOSIS — I509 Heart failure, unspecified: Secondary | ICD-10-CM

## 2017-07-07 LAB — CBC WITH DIFFERENTIAL/PLATELET
BASOS PCT: 1 %
Basophils Absolute: 0.1 10*3/uL (ref 0.0–0.1)
EOS ABS: 0.3 10*3/uL (ref 0.0–0.7)
EOS PCT: 4 %
HCT: 38.5 % (ref 36.0–46.0)
Hemoglobin: 12.3 g/dL (ref 12.0–15.0)
LYMPHS ABS: 2.8 10*3/uL (ref 0.7–4.0)
Lymphocytes Relative: 31 %
MCH: 27.8 pg (ref 26.0–34.0)
MCHC: 31.9 g/dL (ref 30.0–36.0)
MCV: 86.9 fL (ref 78.0–100.0)
Monocytes Absolute: 0.8 10*3/uL (ref 0.1–1.0)
Monocytes Relative: 9 %
Neutro Abs: 5 10*3/uL (ref 1.7–7.7)
Neutrophils Relative %: 55 %
PLATELETS: 185 10*3/uL (ref 150–400)
RBC: 4.43 MIL/uL (ref 3.87–5.11)
RDW: 15.1 % (ref 11.5–15.5)
WBC: 8.9 10*3/uL (ref 4.0–10.5)

## 2017-07-07 LAB — COMPREHENSIVE METABOLIC PANEL
ALT: 13 U/L — ABNORMAL LOW (ref 14–54)
ANION GAP: 9 (ref 5–15)
AST: 18 U/L (ref 15–41)
Albumin: 3.5 g/dL (ref 3.5–5.0)
Alkaline Phosphatase: 60 U/L (ref 38–126)
BILIRUBIN TOTAL: 0.9 mg/dL (ref 0.3–1.2)
BUN: 21 mg/dL — ABNORMAL HIGH (ref 6–20)
CO2: 26 mmol/L (ref 22–32)
Calcium: 9 mg/dL (ref 8.9–10.3)
Chloride: 104 mmol/L (ref 101–111)
Creatinine, Ser: 0.94 mg/dL (ref 0.44–1.00)
GFR calc non Af Amer: >60 mL/min (ref >60)
Glucose, Bld: 112 mg/dL — ABNORMAL HIGH (ref 65–99)
POTASSIUM: 3.4 mmol/L — AB (ref 3.5–5.1)
Sodium: 139 mmol/L (ref 135–145)
TOTAL PROTEIN: 7.8 g/dL (ref 6.5–8.1)

## 2017-07-07 LAB — MAGNESIUM: MAGNESIUM: 2 mg/dL (ref 1.7–2.4)

## 2017-07-07 NOTE — Progress Notes (Signed)
[facility] Progress Note  Patient: Brittney Ross MHD:[id number]   PCP: Patient, No Pcp Per DOB: December 12, 1960   DOA: 07/05/2017   DOS: 07/07/2017   Date of Service: the patient was seen and examined on 07/07/2017  Subjective: No chest pain. Cough is getting better. Breathing is getting better. DOE is also getting better. No chest pain.  Brief hospital course: Pt. with PMH of hypothyroidism, morbid obesity; admitted on 07/05/2017, presented with complaint of shortness of breath, cough, orthopnea and PND, was found to have acute on chronic CHF, etiology not clear. Currently further plan is continue IV Lasix.  Assessment and Plan: 1. Acute systolic CHF Morbid obesity Echocardiogram was 20-25% EF. Diffuse wall motion abnormality. BNP 900 Patient with symptoms of orthopnea, PND as well as dyspnea at rest on the day of presentation. Started on IV Lasix, currently feeling better. Still requiring oxygen. Bilateral basal crackles on examination. EKG unremarkable, no chest pain. Family history of coronary artery disease in mother. Appreciate cardiology input. Patient probably scheduled for cardiac cath some time on Thursday. Optimization of heart failure during that time. With beta blocker and ACE inhibitor.  2. Hypothyroidism. Continue Synthroid. Check TSH and free T4.  3. Hypokalemia. Replacing.  4. Leukocytosis. Likely stress reaction. Monitor.  Diet: Cardiac diet DVT Prophylaxis: subcutaneous Heparin  Advance goals of care discussion: Full code  Family Communication: family was present at bedside, at the time of interview. The pt provided permission to discuss medical plan with the family. Opportunity was given to ask question and all questions were answered satisfactorily.   Disposition:  Discharge to home.  Consultants: card  Procedures: Echocardiogram   Antibiotics: Anti-infectives    None       Objective: Physical Exam: Vitals:   07/07/17 0544 07/07/17 0900  07/07/17 1050 07/07/17 1638  BP:  (!) 131/94 104/73 (!) 128/47  Pulse:  79 73 73  Resp:  18    Temp:  97.9 F (36.6 C)    TempSrc:  Oral    SpO2:  95%    Weight: 129 kg (284 lb 8 oz)     Height:        Intake/Output Summary (Last 24 hours) at 07/07/17 1850 Last data filed at 07/07/17 0735  Gross per 24 hour  Intake                0 ml  Output              600 ml  Net             -600 ml   Filed Weights   07/05/17 2006 07/06/17 0500 07/07/17 0544  Weight: 130 kg (286 lb 9.6 oz) 129.3 kg (285 lb 0.9 oz) 129 kg (284 lb 8 oz)   General: Alert, Awake and Oriented to Time, Place and Person. Appear in mild distress, affect appropriate Eyes: PERRL, Conjunctiva normal ENT: Oral Mucosa clear moist. Neck: difficult to assess JVD, no Abnormal Mass Or lumps Cardiovascular: S1 and S2 Present, no Murmur, Peripheral Pulses Present Respiratory: normal respiratory effort, Bilateral Air entry equal and Decreased, no use of accessory muscle, basal Crackles, no wheezes Abdomen: Bowel Sound present, Soft and no tenderness, no hernia Skin: no redness, no Rash, no induration Extremities: trace Pedal edema, no calf tenderness Neurologic: Grossly no focal neuro deficit. Bilaterally Equal motor strength  Data Reviewed: CBC:  Recent Labs Lab 07/05/17 1545 07/07/17 0450  WBC 12.4* 8.9  NEUTROABS 9.2* 5.0  HGB 12.3 12.3  HCT  38.4 38.5  MCV 86.9 86.9  PLT 182 945   Basic Metabolic Panel:  Recent Labs Lab 07/05/17 1545 07/06/17 0446 07/07/17 0450  NA 138 142 139  K 3.4* 3.5 3.4*  CL 106 107 104  CO2 21* 25 26  GLUCOSE 107* 97 112*  BUN 13 15 21*  CREATININE 0.71 0.80 0.94  CALCIUM 9.1 9.0 9.0  MG  --  2.2 2.0    Liver Function Tests:  Recent Labs Lab 07/07/17 0450  AST 18  ALT 13*  ALKPHOS 60  BILITOT 0.9  PROT 7.8  ALBUMIN 3.5   No results for input(s): LIPASE, AMYLASE in the last 168 hours. No results for input(s): AMMONIA in the last 168 hours. Coagulation  Profile: No results for input(s): INR, PROTIME in the last 168 hours. Cardiac Enzymes: No results for input(s): CKTOTAL, CKMB, CKMBINDEX, TROPONINI in the last 168 hours. BNP (last 3 results) No results for input(s): PROBNP in the last 8760 hours. CBG: No results for input(s): GLUCAP in the last 168 hours. Studies: No results found.  Scheduled Meds: . carvedilol  6.25 mg Oral BID WC  . enoxaparin (LOVENOX) injection  40 mg Subcutaneous Q24H  . furosemide  40 mg Intravenous Q12H  . levothyroxine  125 mcg Oral QAC breakfast  . lisinopril  2.5 mg Oral Daily  . mouth rinse  15 mL Mouth Rinse BID  . potassium chloride  30 mEq Oral BID  . sodium chloride flush  3 mL Intravenous Q12H   Continuous Infusions: . sodium chloride     PRN Meds: sodium chloride, acetaminophen, albuterol, ALPRAZolam, hydrALAZINE, HYDROcodone-acetaminophen, ondansetron (ZOFRAN) IV, sodium chloride flush  Time spent: 35 minutes  Author: Berle Mull, MD [facility] Pager: [phone] 07/07/2017 6:50 PM  If 7PM-7AM, please contact night-coverage at [email or web address], password [facility]

## 2017-07-07 NOTE — Consult Note (Signed)
Cardiology Consultation:   Patient ID: Brittney Ross; [id number]; Sep 18, 1961   Admit date: 07/05/2017 Date of Consult: 07/07/2017  Primary Care Provider: Patient, No Pcp Per Primary Cardiologist: new - Dr. Acie Fredrickson Primary Electrophysiologist:     Patient Profile:   Brittney Ross is a 56 y.o. female with a hx of thyroid disease, HTN, HLD, obesity, and anemia who is being seen today for the evaluation of acute systolic and diastolic heart failure at the request of Dr. Posey Pronto.  History of Present Illness:   Brittney Ross does not currently follow with cardiology. She presented to [facility] with shortness of breath that has been worsening over the last month. She has been evaluated in the ED for SOB twice (06/17/17, 06/21/17) and was treated for URI and then asthma; however, her SOB never resolved. On arrival, BNP was elevated greater than 900 with normal kidney function and CXR with cardiomegaly and possible edema. She was given 40 mg IV lasix OTO and supplemental oxygen for mild respiratory distress.  On my interview, she states that she started using 2 pillows to sleep approximately 2 months ago. 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 Before presenting to [facility], her husband had to dress her because she was so SOB. She denies lower extremity edema and abdominal fullness. She denies chest pain, palpitations, dizziness, and feelings of syncope.     Past Medical History:  Diagnosis Date  . Anemia   . Hyperlipidemia   . Hypertension    Pt states she "doesn't have HTN"  . Morbid obesity ([place])   . Morbid obesity ([place])   . Thyroid disease hyperthyroidism    Past Surgical History:  Procedure Laterality Date  . CESAREAN SECTION  12/07/79  . CESAREAN SECTION  05/17/81  . CESAREAN SECTION  09/06/89  . RATH/BSO/cystoscopy    . TUBAL LIGATION       Home Medications:  Prior to Admission medications   Medication Sig Start Date End Date  Taking? Authorizing Provider  albuterol (PROVENTIL HFA;VENTOLIN HFA) 108 (90 Base) MCG/ACT inhaler Inhale 2 puffs into the lungs every 6 (six) hours as needed for wheezing or shortness of breath.   Yes [provider]  levothyroxine (SYNTHROID, LEVOTHROID) 125 MCG tablet Take 1 tablet (125 mcg total) by mouth daily. 06/15/17  Yes Philemon Kingdom, MD  naproxen (NAPROSYN) 500 MG tablet Take 500 mg by mouth 2 (two) times daily as needed for mild pain.  03/20/17 03/20/18 Yes [provider]  pseudoephedrine-acetaminophen (TYLENOL SINUS) 30-500 MG TABS tablet Take 1 tablet by mouth every 4 (four) hours as needed.   Yes [provider]    Inpatient Medications: Scheduled Meds: . carvedilol  6.25 mg Oral BID WC  . enoxaparin (LOVENOX) injection  40 mg Subcutaneous Q24H  . furosemide  40 mg Intravenous Q12H  . levothyroxine  125 mcg Oral QAC breakfast  . lisinopril  2.5 mg Oral Daily  . mouth rinse  15 mL Mouth Rinse BID  . pneumococcal 23 valent vaccine  0.5 mL Intramuscular Tomorrow-1000  . potassium chloride  30 mEq Oral BID  . sodium chloride flush  3 mL Intravenous Q12H   Continuous Infusions: . sodium chloride     PRN Meds: sodium chloride, acetaminophen, albuterol, ALPRAZolam, hydrALAZINE, HYDROcodone-acetaminophen, ondansetron (ZOFRAN) IV, sodium chloride flush  Allergies:   No Known Allergies  Social History:   Social History   Social History  .  Marital status: Married    Spouse name: N/A  . Number of children: N/A  . Years of education: N/A   Occupational History  . Not on file.   Social History Main Topics  . Smoking status: Never Smoker  . Smokeless tobacco: Never Used  . Alcohol use No  . Drug use: No  . Sexual activity: Yes    Birth control/ protection: Surgical   Other Topics Concern  . Not on file   Social History Narrative  . No narrative on file    Family History:    Family History  Problem Relation Age of Onset  .  Hypertension Father   . Heart disease Mother   . Hypertension Sister      ROS:  Please see the history of present illness.  ROS  All other ROS reviewed and negative.     Physical Exam/Data:   Vitals:   07/06/17 2036 07/07/17 0541 07/07/17 0544 07/07/17 0900  BP: (!) 124/48 108/78  (!) 131/94  Pulse: 92 80  79  Resp: 20 18  18   Temp: 98.9 F (37.2 C) 98.9 F (37.2 C)  97.9 F (36.6 C)  TempSrc: Oral Oral  Oral  SpO2: 99% 98%  95%  Weight:   284 lb 8 oz (129 kg)   Height:        Intake/Output Summary (Last 24 hours) at 07/07/17 0940 Last data filed at 07/07/17 0735  Gross per 24 hour  Intake              240 ml  Output              900 ml  Net             -660 ml   Filed Weights   07/05/17 2006 07/06/17 0500 07/07/17 0544  Weight: 286 lb 9.6 oz (130 kg) 285 lb 0.9 oz (129.3 kg) 284 lb 8 oz (129 kg)   Body mass index is 47.34 kg/m.  General:  Well nourished, well developed, in no acute distress HEENT: normal Lymph: no adenopathy Neck: no JVD Vascular: No carotid bruits Cardiac:  normal S1, S2; RRR; no murmur Lungs:  clear to auscultation bilaterally, no wheezing, possible scattered crackle in left right base  Abd: soft, nontender, no hepatomegaly  Ext: no edema Musculoskeletal:  No deformities, BUE and BLE strength normal and equal Skin: warm and dry  Neuro:  CNs 2-12 intact, no focal abnormalities noted Psych:  Normal affect   EKG:  The EKG was personally reviewed and demonstrates:  Sinus tach with PVC Telemetry:  Telemetry was personally reviewed and demonstrates:  Sinus with trigeminy, bouts of PVCs   Relevant CV Studies:  Echocardiogram 07/07/17: Study Conclusions - Left ventricle: The cavity size was mildly dilated. Systolic   function was severely reduced. The estimated ejection fraction   was in the range of 20% to 25%. Severe diffuse hypokinesis with   no identifiable regional variations. Doppler parameters are   consistent with abnormal left  ventricular relaxation (grade 1   diastolic dysfunction). Acoustic contrast opacification revealed   no evidence ofthrombus. - Mitral valve: There was mild regurgitation. - Left atrium: The atrium was moderately dilated.   Laboratory Data:  Chemistry Recent Labs Lab 07/05/17 1545 07/06/17 0446 07/07/17 0450  NA 138 142 139  K 3.4* 3.5 3.4*  CL 106 107 104  CO2 21* 25 26  GLUCOSE 107* 97 112*  BUN 13 15 21*  CREATININE 0.71 0.80 0.94  CALCIUM 9.1 9.0 9.0  GFRNONAA >60 >60 >60  GFRAA >60 >60 >60  ANIONGAP 11 10 9      Recent Labs Lab 07/07/17 0450  PROT 7.8  ALBUMIN 3.5  AST 18  ALT 13*  ALKPHOS 60  BILITOT 0.9   Hematology Recent Labs Lab 07/05/17 1545 07/07/17 0450  WBC 12.4* 8.9  RBC 4.42 4.43  HGB 12.3 12.3  HCT 38.4 38.5  MCV 86.9 86.9  MCH 27.8 27.8  MCHC 32.0 31.9  RDW 15.0 15.1  PLT 182 185   Cardiac EnzymesNo results for input(s): TROPONINI in the last 168 hours. No results for input(s): TROPIPOC in the last 168 hours.  BNP Recent Labs Lab 07/05/17 1545  BNP 953.6*    DDimer No results for input(s): DDIMER in the last 168 hours.  Radiology/Studies:  Dg Chest 2 View  Result Date: 07/05/2017 CLINICAL DATA:  Acute shortness of breath. EXAM: CHEST  2 VIEW COMPARISON:  06/17/2017 FINDINGS: Cardiomegaly again noted. Bilateral central airspace opacities are identified, moderate on the right and mild on the left. No definite pleural effusion or pneumothorax. No acute bony abnormalities are identified. IMPRESSION: Bilateral central airspace opacities, right greater than left, favor pulmonary edema over infection. Cardiomegaly. Electronically Signed   By: Margarette Canada M.D.   On: 07/05/2017 13:59    Assessment and Plan:   1. Shortness of breath, acute systolic and diastolic heart failure - CXR with cardiomegaly - BNP on admission 953.6 in the setting of normal kidney function - echo this admission with LVEF of 20-25% with diffuse hypokinesis and  grade 1 DD - primary team has started coreg and lisinopril - she is not requiring supplemental O2, no acute respiratory distress - agree with lasix 40 mg IV BID with potassium supplementation - she is Ross net negative 1.4L with 1.5 L urine output yesterday - weight on admission 286 lbs, now 284 lbs Etiology of her newly reduced LVEF unclear. Will diurese this week and recommend right and left heart cath later this week.    2. Hypokalemia - K 3.4, replace with PO - daily BMET   3. Leukocytosis - WBC on admission was 12.4, afebrie - per primary team   4. Hypothyroidism  - per primary team, WNL on 06/24/17    For questions or updates, please contact [place] Please consult [email or web address] for contact info under Cardiology/STEMI.   Signed, Ledora Bottcher, [place]  07/07/2017 9:40 AM   Attending Note:   The patient was seen and examined.  Agree with assessment and plan as noted above.  Changes made to the above note as needed.  Patient seen and independently examined with Doreene Adas , PA .   We discussed all aspects of the encounter. I agree with the assessment and plan as stated above.  1.   Acute on chronic CHF:    No clear etiology for her CHF yet. May be due to obesity. No hx of HTN ,  + family hx of CHF We discussed risks / benefits/ options of cath She understands and agrees to proceed.  Will schedule for Thursday   Continue diuresis, coreg, lisinopril   2. Morbid obesity:  Needs to work on a much better diet and exercise program    I have spent a total of 40 minutes with patient reviewing hospital  notes , telemetry, EKGs, labs and examining patient as well as establishing an assessment and plan that was discussed with the patient. > 50% of time was spent  in direct patient care.    Thayer Headings, Brooke Bonito., MD, [facility] 07/07/2017, 10:37 AM [address]  [place] Pager [phone]

## 2017-07-07 NOTE — Progress Notes (Signed)
PT Cancellation Note  Patient Details Name: LYLLA EIFLER MRN: [id number] DOB: Sep 26, 1961   Cancelled Treatment:    Reason Eval/Treat Not Completed: Patient declined, stated" Can we wait until later?" will check back as schedule allows.  Claretha Cooper 07/07/2017, 9:04 AM Tresa Endo PT [phone]

## 2017-07-08 DIAGNOSIS — I5021 Acute systolic (congestive) heart failure: Secondary | ICD-10-CM

## 2017-07-08 DIAGNOSIS — I10 Essential (primary) hypertension: Secondary | ICD-10-CM

## 2017-07-08 DIAGNOSIS — R0602 Shortness of breath: Secondary | ICD-10-CM

## 2017-07-08 LAB — BASIC METABOLIC PANEL
Anion gap: 9 (ref 5–15)
BUN: 24 mg/dL — AB (ref 6–20)
CALCIUM: 9.2 mg/dL (ref 8.9–10.3)
CHLORIDE: 105 mmol/L (ref 101–111)
CO2: 27 mmol/L (ref 22–32)
CREATININE: 1.12 mg/dL — AB (ref 0.44–1.00)
GFR calc non Af Amer: 54 mL/min — ABNORMAL LOW (ref >60)
Glucose, Bld: 123 mg/dL — ABNORMAL HIGH (ref 65–99)
Potassium: 4 mmol/L (ref 3.5–5.1)
SODIUM: 141 mmol/L (ref 135–145)

## 2017-07-08 LAB — T4, FREE: Free T4: 1.01 ng/dL (ref 0.61–1.12)

## 2017-07-08 LAB — TSH: TSH: 8.462 u[IU]/mL — AB (ref 0.350–4.500)

## 2017-07-08 MED ORDER — ATORVASTATIN CALCIUM 40 MG PO TABS
80.0000 mg | ORAL_TABLET | Freq: Every day | ORAL | Status: DC
  Administered 2017-07-09 – 2017-07-10 (×2): 80 mg via ORAL
  Filled 2017-07-08 (×2): qty 2

## 2017-07-08 MED ORDER — SODIUM CHLORIDE 0.9 % IV SOLN: 250.0000 mL | INTRAVENOUS | Status: DC | PRN

## 2017-07-08 MED ORDER — ASPIRIN 81 MG PO CHEW
81.0000 mg | CHEWABLE_TABLET | ORAL | Status: AC
  Administered 2017-07-09: 81 mg via ORAL
  Filled 2017-07-08: qty 1

## 2017-07-08 MED ORDER — SODIUM CHLORIDE 0.9 % IV SOLN: INTRAVENOUS | Status: DC

## 2017-07-08 MED ORDER — SODIUM CHLORIDE 0.9% FLUSH
3.0000 mL | Freq: Two times a day (BID) | INTRAVENOUS | Status: DC
  Administered 2017-07-08 – 2017-07-09 (×3): 3 mL via INTRAVENOUS

## 2017-07-08 MED ORDER — SODIUM CHLORIDE 0.9% FLUSH: 3.0000 mL | INTRAVENOUS | Status: DC | PRN

## 2017-07-08 MED ORDER — ATORVASTATIN CALCIUM 40 MG PO TABS
80.0000 mg | ORAL_TABLET | ORAL | Status: AC
  Administered 2017-07-08: 80 mg via ORAL
  Filled 2017-07-08: qty 2

## 2017-07-08 MED ORDER — SODIUM CHLORIDE 0.9 % IV SOLN
INTRAVENOUS | Status: AC
  Administered 2017-07-09: 05:00:00 via INTRAVENOUS

## 2017-07-08 MED ORDER — POTASSIUM CHLORIDE CRYS ER 20 MEQ PO TBCR
20.0000 meq | EXTENDED_RELEASE_TABLET | Freq: Two times a day (BID) | ORAL | Status: AC
  Administered 2017-07-08: 20 meq via ORAL
  Filled 2017-07-08: qty 1

## 2017-07-08 NOTE — Progress Notes (Signed)
Patient had a 7 beat run V-tach on tele. Asymptomatic. Dr Florene Glen informed. Will continue to monitor closely.  Lind Guest, RN

## 2017-07-08 NOTE — Progress Notes (Signed)
Pt states that she will not need HH at present time.  Appointment at [facility] 07/17/17 at 10 AM.

## 2017-07-08 NOTE — Progress Notes (Signed)
PROGRESS NOTE    Brittney Ross  BJY:[id number] DOB: [birth year] DOA: 07/05/2017 PCP: Patient, No Pcp Per    Brief Narrative:  56 yo with hx of hypothyroidism, morbid obesity presenting with SOB, cough, orthopnea and PND found to have acute on chronic CHF.     Assessment & Plan:   Principal Problem:   Acute CHF (congestive heart failure) (HCC) Active Problems:   Hypothyroidism, postablative   HYPERTENSION, BENIGN ESSENTIAL   Hypokalemia   Acute pulmonary edema ([place])   1. Acute systolic CHF Morbid obesity Echocardiogram was 20-25% EF. Diffuse wall motion abnormality. BNP 900 Patient with symptoms of orthopnea, PND as well as dyspnea at rest on the day of presentation. Started on IV Lasix, currently feeling better. Still requiring oxygen. Bilateral basal crackles on examination. EKG unremarkable, no chest pain. Currently holding lasix Family history of coronary artery disease in mother. Appreciate cardiology input. Patient probably scheduled for cardiac cath some time on Thursday. Optimization of heart failure during that time. With beta blocker and ACE inhibitor.  2. Elevated Creatinine: baseline 0.8, up to 1.12 - holding lasix  3. Hypothyroidism. Continue Synthroid. TSH high 8.462, T4 within normal, but 2 weeks ago normal.  Repeat outpatient.     4. Hypokalemia. Replacing.  5. Leukocytosis. improved.   DVT prophylaxis: heparin Code Status: full  Family Communication: none in room Disposition Plan: pending cardiology cath   Consultants:   cardiology  Procedures: (Don't include imaging studies which can be auto populated. Include things that cannot be auto populated i.e. Echo, Carotid and venous dopplers, Foley, Bipap, HD, tubes/drains, wound vac, central lines etc)  echo  Antimicrobials: (specify start and planned stop date. Auto populated tables are space occupying and do not give end dates)  none    Subjective: SOB better.  Can lie flat on her back.   No CP, never had CP.  Doing ok overall.  Cath tomorrow.    Objective: Vitals:   07/07/17 2315 07/08/17 0541 07/08/17 0700 07/08/17 0748  BP: (!) 117/55 (!) 111/59  111/65  Pulse: 81 77 75 75  Resp: 18 18  20   Temp: 98.5 F (36.9 C) 98.5 F (36.9 C)  98.4 F (36.9 C)  TempSrc: Oral Oral  Oral  SpO2: 94% 99% 97% 97%  Weight:  128.9 kg (284 lb 1.6 oz)    Height:        Intake/Output Summary (Last 24 hours) at 07/08/17 1049 Last data filed at 07/08/17 0900  Gross per 24 hour  Intake              240 ml  Output             1400 ml  Net            -1160 ml   Filed Weights   07/06/17 0500 07/07/17 0544 07/08/17 0541  Weight: 129.3 kg (285 lb 0.9 oz) 129 kg (284 lb 8 oz) 128.9 kg (284 lb 1.6 oz)    Examination:  General exam: Appears calm and comfortable  Respiratory system: Clear to auscultation. Respiratory effort normal. Cardiovascular system: S1 & S2 heard, RRR. No JVD, murmurs, rubs, gallops or clicks. No pedal edema. Gastrointestinal system: Abdomen is nondistended, soft and nontender. No organomegaly or masses felt. Normal bowel sounds heard. Central nervous system: Alert and oriented. No focal neurological deficits. Extremities: Symmetric 5 x 5 power.  Palpable pedal pulses. Skin: No rashes, lesions or ulcers Psychiatry: Judgement and insight appear normal. Mood & affect appropriate.  Data Reviewed: I have personally reviewed following labs and imaging studies  CBC:  Recent Labs Lab 07/05/17 1545 07/07/17 0450  WBC 12.4* 8.9  NEUTROABS 9.2* 5.0  HGB 12.3 12.3  HCT 38.4 38.5  MCV 86.9 86.9  PLT 182 779   Basic Metabolic Panel:  Recent Labs Lab 07/05/17 1545 07/06/17 0446 07/07/17 0450 07/08/17 0436  NA 138 142 139 141  K 3.4* 3.5 3.4* 4.0  CL 106 107 104 105  CO2 21* 25 26 27   GLUCOSE 107* 97 112* 123*  BUN 13 15 21* 24*  CREATININE 0.71 0.80 0.94 1.12*  CALCIUM 9.1 9.0 9.0 9.2  MG  --  2.2 2.0  --    GFR: Estimated Creatinine Clearance:  76.9 mL/min (Brittney Ross) (by C-G formula based on SCr of 1.12 mg/dL (H)). Liver Function Tests:  Recent Labs Lab 07/07/17 0450  AST 18  ALT 13*  ALKPHOS 60  BILITOT 0.9  PROT 7.8  ALBUMIN 3.5   No results for input(s): LIPASE, AMYLASE in the last 168 hours. No results for input(s): AMMONIA in the last 168 hours. Coagulation Profile: No results for input(s): INR, PROTIME in the last 168 hours. Cardiac Enzymes: No results for input(s): CKTOTAL, CKMB, CKMBINDEX, TROPONINI in the last 168 hours. BNP (last 3 results) No results for input(s): PROBNP in the last 8760 hours. HbA1C: No results for input(s): HGBA1C in the last 72 hours. CBG: No results for input(s): GLUCAP in the last 168 hours. Lipid Profile: No results for input(s): CHOL, HDL, LDLCALC, TRIG, CHOLHDL, LDLDIRECT in the last 72 hours. Thyroid Function Tests:  Recent Labs  07/08/17 0436  TSH 8.462*  FREET4 1.01   Anemia Panel: No results for input(s): VITAMINB12, FOLATE, FERRITIN, TIBC, IRON, RETICCTPCT in the last 72 hours. Sepsis Labs: No results for input(s): PROCALCITON, LATICACIDVEN in the last 168 hours.  No results found for this or any previous visit (from the past 240 hour(s)).       Radiology Studies: No results found.      Scheduled Meds: . carvedilol  6.25 mg Oral BID WC  . enoxaparin (LOVENOX) injection  40 mg Subcutaneous Q24H  . levothyroxine  125 mcg Oral QAC breakfast  . lisinopril  2.5 mg Oral Daily  . mouth rinse  15 mL Mouth Rinse BID  . potassium chloride  20 mEq Oral BID  . sodium chloride flush  3 mL Intravenous Q12H   Continuous Infusions: . sodium chloride       LOS: 3 days    Time spent: 30 minutes    Brittney Helper, MD [facility] [phone]   If 7PM-7AM, please contact night-coverage [email or web address] Password [facility] 07/08/2017, 10:49 AM

## 2017-07-08 NOTE — Progress Notes (Addendum)
Progress Note  Patient Name: Brittney Ross Date of Encounter: 07/08/2017  Primary Cardiologist: new - Dr. Acie Fredrickson  Subjective   Pt was able to sleep flat last night without 2 pillows. She is breathing well, no chest pain or palpitations.  Inpatient Medications    Scheduled Meds: . carvedilol  6.25 mg Oral BID WC  . enoxaparin (LOVENOX) injection  40 mg Subcutaneous Q24H  . furosemide  40 mg Intravenous Q12H  . levothyroxine  125 mcg Oral QAC breakfast  . lisinopril  2.5 mg Oral Daily  . mouth rinse  15 mL Mouth Rinse BID  . potassium chloride  30 mEq Oral BID  . sodium chloride flush  3 mL Intravenous Q12H   Continuous Infusions: . sodium chloride     PRN Meds: sodium chloride, acetaminophen, albuterol, ALPRAZolam, hydrALAZINE, HYDROcodone-acetaminophen, ondansetron (ZOFRAN) IV, sodium chloride flush   Vital Signs    Vitals:   07/07/17 2315 07/08/17 0541 07/08/17 0700 07/08/17 0748  BP: (!) 117/55 (!) 111/59  111/65  Pulse: 81 77 75 75  Resp: 18 18  20   Temp: 98.5 F (36.9 C) 98.5 F (36.9 C)  98.4 F (36.9 C)  TempSrc: Oral Oral  Oral  SpO2: 94% 99% 97% 97%  Weight:  284 lb 1.6 oz (128.9 kg)    Height:        Intake/Output Summary (Last 24 hours) at 07/08/17 1022 Last data filed at 07/08/17 0900  Gross per 24 hour  Intake              240 ml  Output             1400 ml  Net            -1160 ml   Filed Weights   07/06/17 0500 07/07/17 0544 07/08/17 0541  Weight: 285 lb 0.9 oz (129.3 kg) 284 lb 8 oz (129 kg) 284 lb 1.6 oz (128.9 kg)     Physical Exam   General: Well developed, well nourished, female appearing in no acute distress. Head: Normocephalic, atraumatic.  Neck: Supple without bruits, no JVD Lungs:  Resp regular and unlabored, CTA. Heart: RRR, S1, S2, no murmur; no rub. Abdomen: Soft, non-tender, non-distended with normoactive bowel sounds. No hepatomegaly. No rebound/guarding. No obvious abdominal masses. Extremities: No clubbing, cyanosis,  trace edema. Distal pedal pulses are 2+ bilaterally. Neuro: Alert and oriented X 3. Moves all extremities spontaneously. Psych: Normal affect.  Labs    Chemistry Recent Labs Lab 07/06/17 0446 07/07/17 0450 07/08/17 0436  NA 142 139 141  K 3.5 3.4* 4.0  CL 107 104 105  CO2 25 26 27   GLUCOSE 97 112* 123*  BUN 15 21* 24*  CREATININE 0.80 0.94 1.12*  CALCIUM 9.0 9.0 9.2  PROT  --  7.8  --   ALBUMIN  --  3.5  --   AST  --  18  --   ALT  --  13*  --   ALKPHOS  --  60  --   BILITOT  --  0.9  --   GFRNONAA >60 >60 54*  GFRAA >60 >60 >60  ANIONGAP 10 9 9      Hematology Recent Labs Lab 07/05/17 1545 07/07/17 0450  WBC 12.4* 8.9  RBC 4.42 4.43  HGB 12.3 12.3  HCT 38.4 38.5  MCV 86.9 86.9  MCH 27.8 27.8  MCHC 32.0 31.9  RDW 15.0 15.1  PLT 182 185    Cardiac EnzymesNo results for input(s):  TROPONINI in the last 168 hours. No results for input(s): TROPIPOC in the last 168 hours.   BNP Recent Labs Lab 07/05/17 1545  BNP 953.6*     DDimer No results for input(s): DDIMER in the last 168 hours.   Radiology    No results found.   Telemetry    Sinus with frequent PVCs - Personally Reviewed  ECG    No new tracings - Personally Reviewed   Cardiac Studies   Echocardiogram 07/07/17: Study Conclusions - Left ventricle: The cavity size was mildly dilated. Systolic function was severely reduced. The estimated ejection fraction was in the range of 20% to 25%. Severe diffuse hypokinesis with no identifiable regional variations. Doppler parameters are consistent with abnormal left ventricular relaxation (grade 1 diastolic dysfunction). Acoustic contrast opacification revealed no evidence ofthrombus. - Mitral valve: There was mild regurgitation. - Left atrium: The atrium was moderately dilated.  Patient Profile     56 y.o. female with a hx of thyroid disease, HTN, HLD, obesity, and anemia who is being seen for the evaluation of newly diagnosed  systolic and diastolic heart failure   Assessment & Plan    1. Newly diagnosed systolic and diastolic heart failure - she is diuresing well on 40 mg IV lasix BID - decreased potassium supplement to 20 mEq Kdur BID - K 4.0 (3.4) - she is overall net negative 2.2 L with 1.1 L urine output yesterday - weight on admission 286 lbs, now 284 lbs - etiology for this new onset combined heart failure unknown. Will plan for right and left heart cath tomorrow.    2. AKI - sCr 1.12 (0.94) - making good urine - hold evening lasix today and tomorrow doses for heart cath - gentle hydration - check BMET tomorrow morning   Signed, Ledora Bottcher , PA-C 10:22 AM 07/08/2017 Pager: [phone]   Attending Note:   The patient was seen and examined.  Agree with assessment and plan as noted above.  Changes made to the above note as needed.  Patient seen and independently examined with Doreene Adas, PA .   We discussed all aspects of the encounter. I agree with the assessment and plan as stated above.  1.   Acute systolic chf:   Able to lie flat with out too much trouble. Scheduled for cath tomorrow . Creatinine is up sligtly.   Agree with holding lasix tonight . Hydration in the am     I have spent a total of 40 minutes with patient reviewing hospital  notes , telemetry, EKGs, labs and examining patient as well as establishing an assessment and plan that was discussed with the patient. > 50% of time was spent in direct patient care.    Thayer Headings, Brooke Bonito., MD, [facility] 07/08/2017, 12:35 PM [address]  [place] Pager [phone]

## 2017-07-08 NOTE — Evaluation (Addendum)
Physical Therapy Evaluation Patient Details Name: Brittney Ross MRN: [id number] DOB: 1961/01/27 Today's Date: 07/08/2017   History of Present Illness  Pt admitted with dx of resp distress and CHF.    Clinical Impression  Pt admitted as above and demonstrating MOD I with all mobility tasks and ambulating unassisted.  Pt with limited endurance but states feeling better and hoping for dc home with family assist tomorrow.  Pt with no current PT needs and PT services to dc at this time.  Please reorder if pt status changes    Follow Up Recommendations No PT follow up    Equipment Recommendations  None recommended by PT    Recommendations for Other Services       Precautions / Restrictions Precautions Precautions: None Restrictions Weight Bearing Restrictions: No      Mobility  Bed Mobility Overal bed mobility: Modified Independent                Transfers Overall transfer level: Modified independent Equipment used: None             General transfer comment: Pt unassisted   Ambulation/Gait Ambulation/Gait assistance: Supervision;Independent Ambulation Distance (Feet): 450 Feet Assistive device: None Gait Pattern/deviations: Step-through pattern;Decreased step length - right;Decreased step length - left;Shuffle;Wide base of support Gait velocity: decr Gait velocity interpretation: Below normal speed for age/gender General Gait Details: Pt ambulated 450' with one short standing rest 2* fatigue.  Pt with good stability, no LOB and able to step fwd, bkwd and side step demonstrating good balance.  Min assist for balance with tandem step.  Stairs            Wheelchair Mobility    Modified Rankin (Stroke Patients Only)       Balance Overall balance assessment: No apparent balance deficits (not formally assessed)                                           Pertinent Vitals/Pain Pain Assessment: No/denies pain    Home Living  Family/patient expects to be discharged to:: Private residence   Available Help at Discharge: Family Type of Home: House Home Access: Level entry     Home Layout: One level Home Equipment: None      Prior Function Level of Independence: Independent               Hand Dominance        Extremity/Trunk Assessment   Upper Extremity Assessment Upper Extremity Assessment: Overall WFL for tasks assessed    Lower Extremity Assessment Lower Extremity Assessment: Overall WFL for tasks assessed       Communication   Communication: No difficulties  Cognition Arousal/Alertness: Awake/alert Behavior During Therapy: WFL for tasks assessed/performed Overall Cognitive Status: Within Functional Limits for tasks assessed                                        General Comments      Exercises     Assessment/Plan    PT Assessment Patent does not need any further PT services  PT Problem List         PT Treatment Interventions      PT Goals (Current goals can be found in the Care Plan section)  Acute Rehab PT Goals Patient Stated Goal:  Resume previous lifestyle PT Goal Formulation: All assessment and education complete, DC therapy    Frequency     Barriers to discharge        Co-evaluation               AM-PAC PT "6 Clicks" Daily Activity  Outcome Measure Difficulty turning over in bed (including adjusting bedclothes, sheets and blankets)?: A Little Difficulty moving from lying on back to sitting on the side of the bed? : A Little Difficulty sitting down on and standing up from a chair with arms (e.g., wheelchair, bedside commode, etc,.)?: None Help needed moving to and from a bed to chair (including a wheelchair)?: None Help needed walking in hospital room?: None Help needed climbing 3-5 steps with a railing? : A Little 6 Click Score: 21    End of Session Equipment Utilized During Treatment: Gait belt Activity Tolerance: Patient  tolerated treatment well Patient left: Other (comment) (sitting EOB ) Nurse Communication: Mobility status PT Visit Diagnosis: Difficulty in walking, not elsewhere classified (R26.2)    Time: 0211-1552 PT Time Calculation (min) (ACUTE ONLY): 10 min   Charges:   PT Evaluation $PT Eval Low Complexity: 1 Low     PT G Codes:        Pg [phone]   Brittney Ross 07/08/2017, 1:00 PM

## 2017-07-09 ENCOUNTER — Encounter (HOSPITAL_COMMUNITY)
Admission: EM | Disposition: A | Discharge status: Home / Self Care | Payer: Self-pay | Source: Home / Self Care | Attending: Family Medicine

## 2017-07-09 DIAGNOSIS — I5043 Acute on chronic combined systolic (congestive) and diastolic (congestive) heart failure: Secondary | ICD-10-CM

## 2017-07-09 DIAGNOSIS — I2510 Atherosclerotic heart disease of native coronary artery without angina pectoris: Secondary | ICD-10-CM

## 2017-07-09 DIAGNOSIS — I420 Dilated cardiomyopathy: Secondary | ICD-10-CM

## 2017-07-09 HISTORY — PX: RIGHT/LEFT HEART CATH AND CORONARY ANGIOGRAPHY: CATH118266

## 2017-07-09 LAB — POCT I-STAT 3, ART BLOOD GAS (G3+)
Acid-base deficit: 3 mmol/L — ABNORMAL HIGH (ref 0.0–2.0)
Bicarbonate: 22.2 mmol/L (ref 20.0–28.0)
O2 SAT: 97 %
PCO2 ART: 41.6 mmHg (ref 32.0–48.0)
PH ART: 7.336 — AB (ref 7.350–7.450)
PO2 ART: 99 mmHg (ref 83.0–108.0)
TCO2: 23 mmol/L (ref 22–32)

## 2017-07-09 LAB — POCT I-STAT 3, VENOUS BLOOD GAS (G3P V)
Acid-Base Excess: 1 mmol/L (ref 0.0–2.0)
Acid-base deficit: 1 mmol/L (ref 0.0–2.0)
BICARBONATE: 24.1 mmol/L (ref 20.0–28.0)
Bicarbonate: 26.2 mmol/L (ref 20.0–28.0)
O2 Saturation: 69 %
O2 Saturation: 72 %
PCO2 VEN: 42.5 mmHg — AB (ref 44.0–60.0)
PH VEN: 7.362 (ref 7.250–7.430)
PH VEN: 7.369 (ref 7.250–7.430)
PO2 VEN: 37 mmHg (ref 32.0–45.0)
TCO2: 28 mmol/L (ref 22–32)
TCO2: 25 mmol/L (ref 22–32)
pCO2, Ven: 45.5 mmHg (ref 44.0–60.0)
pO2, Ven: 39 mmHg (ref 32.0–45.0)

## 2017-07-09 LAB — CBC
HEMATOCRIT: 36.4 % (ref 36.0–46.0)
Hemoglobin: 11.5 g/dL — ABNORMAL LOW (ref 12.0–15.0)
MCH: 27.3 pg (ref 26.0–34.0)
MCHC: 31.6 g/dL (ref 30.0–36.0)
MCV: 86.3 fL (ref 78.0–100.0)
Platelets: 195 10*3/uL (ref 150–400)
RBC: 4.22 MIL/uL (ref 3.87–5.11)
RDW: 15.1 % (ref 11.5–15.5)
WBC: 6.6 10*3/uL (ref 4.0–10.5)

## 2017-07-09 LAB — BASIC METABOLIC PANEL
Anion gap: 10 (ref 5–15)
BUN: 23 mg/dL — ABNORMAL HIGH (ref 6–20)
CALCIUM: 9 mg/dL (ref 8.9–10.3)
CO2: 25 mmol/L (ref 22–32)
CREATININE: 0.88 mg/dL (ref 0.44–1.00)
Chloride: 106 mmol/L (ref 101–111)
GFR calc non Af Amer: >60 mL/min (ref >60)
Glucose, Bld: 106 mg/dL — ABNORMAL HIGH (ref 65–99)
Potassium: 3.9 mmol/L (ref 3.5–5.1)
SODIUM: 141 mmol/L (ref 135–145)

## 2017-07-09 LAB — PROTIME-INR
INR: 1.05
PROTHROMBIN TIME: 13.6 s (ref 11.4–15.2)

## 2017-07-09 SURGERY — RIGHT/LEFT HEART CATH AND CORONARY ANGIOGRAPHY
Anesthesia: LOCAL

## 2017-07-09 MED ORDER — HYDRALAZINE HCL 20 MG/ML IJ SOLN
INTRAMUSCULAR | Status: AC
  Filled 2017-07-09: qty 1

## 2017-07-09 MED ORDER — HEPARIN (PORCINE) IN NACL 2-0.9 UNIT/ML-% IJ SOLN
INTRAMUSCULAR | Status: AC
  Filled 2017-07-09: qty 1000

## 2017-07-09 MED ORDER — SODIUM CHLORIDE 0.9 % IV SOLN: 250.0000 mL | INTRAVENOUS | Status: DC | PRN

## 2017-07-09 MED ORDER — MIDAZOLAM HCL 2 MG/2ML IJ SOLN
INTRAMUSCULAR | Status: DC | PRN
  Administered 2017-07-09: 2 mg via INTRAVENOUS

## 2017-07-09 MED ORDER — FENTANYL CITRATE (PF) 100 MCG/2ML IJ SOLN
INTRAMUSCULAR | Status: DC | PRN
  Administered 2017-07-09: 50 ug via INTRAVENOUS

## 2017-07-09 MED ORDER — SODIUM CHLORIDE 0.9% FLUSH: 3.0000 mL | INTRAVENOUS | Status: DC | PRN

## 2017-07-09 MED ORDER — IOPAMIDOL (ISOVUE-370) INJECTION 76%
INTRAVENOUS | Status: AC
  Filled 2017-07-09: qty 100

## 2017-07-09 MED ORDER — LIDOCAINE HCL 2 % IJ SOLN
INTRAMUSCULAR | Status: AC
  Filled 2017-07-09: qty 10

## 2017-07-09 MED ORDER — ACETAMINOPHEN 325 MG PO TABS: 650.0000 mg | ORAL_TABLET | ORAL | Status: DC | PRN

## 2017-07-09 MED ORDER — HEPARIN (PORCINE) IN NACL 2-0.9 UNIT/ML-% IJ SOLN
INTRAMUSCULAR | Status: AC | PRN
  Administered 2017-07-09: 1000 mL

## 2017-07-09 MED ORDER — VERAPAMIL HCL 2.5 MG/ML IV SOLN
INTRAVENOUS | Status: DC | PRN
  Administered 2017-07-09: 10 mL via INTRA_ARTERIAL

## 2017-07-09 MED ORDER — DIAZEPAM 5 MG PO TABS: 5.0000 mg | ORAL_TABLET | Freq: Four times a day (QID) | ORAL | Status: DC | PRN

## 2017-07-09 MED ORDER — LIDOCAINE HCL (PF) 1 % IJ SOLN
INTRAMUSCULAR | Status: DC | PRN
  Administered 2017-07-09 (×2): 2 mL

## 2017-07-09 MED ORDER — ENOXAPARIN SODIUM 40 MG/0.4ML ~~LOC~~ SOLN
40.0000 mg | SUBCUTANEOUS | Status: DC
  Filled 2017-07-09: qty 0.4

## 2017-07-09 MED ORDER — ONDANSETRON HCL 4 MG/2ML IJ SOLN
4.0000 mg | Freq: Four times a day (QID) | INTRAMUSCULAR | Status: DC | PRN
Start: 2017-07-09 — End: 2017-07-10

## 2017-07-09 MED ORDER — SODIUM CHLORIDE 0.9% FLUSH
3.0000 mL | Freq: Two times a day (BID) | INTRAVENOUS | Status: DC
Start: 2017-07-09 — End: 2017-07-10
  Administered 2017-07-09: 3 mL via INTRAVENOUS

## 2017-07-09 MED ORDER — MIDAZOLAM HCL 2 MG/2ML IJ SOLN
INTRAMUSCULAR | Status: AC
  Filled 2017-07-09: qty 2

## 2017-07-09 MED ORDER — HEPARIN SODIUM (PORCINE) 1000 UNIT/ML IJ SOLN
INTRAMUSCULAR | Status: DC | PRN
  Administered 2017-07-09: 6000 [IU] via INTRAVENOUS

## 2017-07-09 MED ORDER — VERAPAMIL HCL 2.5 MG/ML IV SOLN
INTRAVENOUS | Status: AC
  Filled 2017-07-09: qty 2

## 2017-07-09 MED ORDER — HYDRALAZINE HCL 20 MG/ML IJ SOLN
INTRAMUSCULAR | Status: DC | PRN
  Administered 2017-07-09: 10 mg via INTRAVENOUS

## 2017-07-09 MED ORDER — SODIUM CHLORIDE 0.9 % IV SOLN
INTRAVENOUS | Status: AC
  Administered 2017-07-09: 19:00:00 via INTRAVENOUS

## 2017-07-09 MED ORDER — FENTANYL CITRATE (PF) 100 MCG/2ML IJ SOLN
INTRAMUSCULAR | Status: AC
  Filled 2017-07-09: qty 2

## 2017-07-09 SURGICAL SUPPLY — 12 items
CATH 5FR JL3.5 JR4 ANG PIG MP (CATHETERS) ×1 IMPLANT
CATH BALLN WEDGE 5F 110CM (CATHETERS) ×1 IMPLANT
DEVICE RAD COMP TR BAND LRG (VASCULAR PRODUCTS) ×1 IMPLANT
GLIDESHEATH SLEND SS 6F .021 (SHEATH) ×1 IMPLANT
GUIDEWIRE INQWIRE 1.5J.035X260 (WIRE) IMPLANT
INQWIRE 1.5J .035X260CM (WIRE) ×2
KIT HEART LEFT (KITS) ×2 IMPLANT
PACK CARDIAC CATHETERIZATION (CUSTOM PROCEDURE TRAY) ×2 IMPLANT
SHEATH GLIDE SLENDER 4/5FR (SHEATH) ×1 IMPLANT
TRANSDUCER W/STOPCOCK (MISCELLANEOUS) ×2 IMPLANT
TUBING CIL FLEX 10 FLL-RA (TUBING) ×2 IMPLANT
WIRE EMERALD 3MM-J .025X260CM (WIRE) ×1 IMPLANT

## 2017-07-09 NOTE — Progress Notes (Signed)
PROGRESS NOTE    Brittney Ross  STM:[id number] DOB: [birth year] DOA: 07/05/2017 PCP: Patient, No Pcp Per    Brief Narrative:  56 yo with hx of hypothyroidism, morbid obesity presenting with SOB, cough, orthopnea and PND found to have acute on chronic CHF.     Assessment & Plan:   Principal Problem:   Acute CHF (congestive heart failure) (HCC) Active Problems:   Hypothyroidism, postablative   HYPERTENSION, BENIGN ESSENTIAL   Hypokalemia   Acute pulmonary edema ([place])   1. Acute systolic CHF Morbid obesity Echocardiogram was 20-25% EF. Diffuse wall motion abnormality. BNP 900 Patient with symptoms of orthopnea, PND as well as dyspnea at rest on the day of presentation. Started on IV Lasix, currently feeling better. Still requiring oxygen. Bilateral basal crackles on examination. EKG unremarkable, no chest pain. Currently holding lasix Family history of coronary artery disease in mother. Appreciate cardiology input. Awaiting cath today, 9/20. Optimization of heart failure during that time. With beta blocker and ACE inhibitor.  2. Elevated Creatinine: improved after holding lasix - holding lasix  3. Hypothyroidism. Continue Synthroid. TSH high 8.462, T4 within normal, but 2 weeks ago normal.  Repeat outpatient.     4. Hypokalemia. Replacing.  5. Leukocytosis. improved.   DVT prophylaxis: lovenox Code Status: full  Family Communication: none in room Disposition Plan: pending cardiology cath   Consultants:   cardiology  Procedures: (Don't include imaging studies which can be auto populated. Include things that cannot be auto populated i.e. Echo, Carotid and venous dopplers, Foley, Bipap, HD, tubes/drains, wound vac, central lines etc)  echo  Antimicrobials: (specify start and planned stop date. Auto populated tables are space occupying and do not give end dates)  none    Subjective: No concerns.  Just waiting.    Objective: Vitals:   07/08/17 1618  07/08/17 2039 07/09/17 0457 07/09/17 1038  BP: 115/66 114/60 118/82 112/70  Pulse: 74 76 66 66  Resp: 18   20  Temp: (!) 97.4 F (36.3 C) 98.4 F (36.9 C) 98.3 F (36.8 C) 97.7 F (36.5 C)  TempSrc: Oral Oral Oral Oral  SpO2: 99% 98% 100% 99%  Weight:      Height:        Intake/Output Summary (Last 24 hours) at 07/09/17 1044 Last data filed at 07/09/17 0600  Gross per 24 hour  Intake           423.75 ml  Output              400 ml  Net            23.75 ml   Filed Weights   07/06/17 0500 07/07/17 0544 07/08/17 0541  Weight: 129.3 kg (285 lb 0.9 oz) 129 kg (284 lb 8 oz) 128.9 kg (284 lb 1.6 oz)    Examination:  General: No acute distress. Cardiovascular: Heart sounds show [name] regular rate, and rhythm. No gallops or rubs. No murmurs. No JVD. Lungs: Clear to auscultation bilaterally with good air movement. No rales, rhonchi or wheezes. Abdomen: Soft, nontender, nondistended with normal active bowel sounds. No masses. No hepatosplenomegaly. Neurological: Alert and oriented 3. Moves all extremities 4 with equal strength. Cranial nerves II through XII grossly intact. Skin: Warm and dry. No rashes or lesions. Extremities: No clubbing or cyanosis. No edema. Pedal pulses 2+. Psychiatric: Mood and affect are normal. Insight and judgment are appropriate.    Data Reviewed: I have personally reviewed following labs and imaging studies  CBC:  Recent  Labs Lab 07/05/17 1545 07/07/17 0450 07/09/17 0414  WBC 12.4* 8.9 6.6  NEUTROABS 9.2* 5.0  --   HGB 12.3 12.3 11.5*  HCT 38.4 38.5 36.4  MCV 86.9 86.9 86.3  PLT 182 185 259   Basic Metabolic Panel:  Recent Labs Lab 07/05/17 1545 07/06/17 0446 07/07/17 0450 07/08/17 0436 07/09/17 0414  NA 138 142 139 141 141  K 3.4* 3.5 3.4* 4.0 3.9  CL 106 107 104 105 106  CO2 21* 25 26 27 25   GLUCOSE 107* 97 112* 123* 106*  BUN 13 15 21* 24* 23*  CREATININE 0.71 0.80 0.94 1.12* 0.88  CALCIUM 9.1 9.0 9.0 9.2 9.0  MG  --  2.2 2.0   --   --    GFR: Estimated Creatinine Clearance: 97.8 mL/min (by C-G formula based on SCr of 0.88 mg/dL). Liver Function Tests:  Recent Labs Lab 07/07/17 0450  AST 18  ALT 13*  ALKPHOS 60  BILITOT 0.9  PROT 7.8  ALBUMIN 3.5   No results for input(s): LIPASE, AMYLASE in the last 168 hours. No results for input(s): AMMONIA in the last 168 hours. Coagulation Profile:  Recent Labs Lab 07/09/17 0414  INR 1.05   Cardiac Enzymes: No results for input(s): CKTOTAL, CKMB, CKMBINDEX, TROPONINI in the last 168 hours. BNP (last 3 results) No results for input(s): PROBNP in the last 8760 hours. HbA1C: No results for input(s): HGBA1C in the last 72 hours. CBG: No results for input(s): GLUCAP in the last 168 hours. Lipid Profile: No results for input(s): CHOL, HDL, LDLCALC, TRIG, CHOLHDL, LDLDIRECT in the last 72 hours. Thyroid Function Tests:  Recent Labs  07/08/17 0436  TSH 8.462*  FREET4 1.01   Anemia Panel: No results for input(s): VITAMINB12, FOLATE, FERRITIN, TIBC, IRON, RETICCTPCT in the last 72 hours. Sepsis Labs: No results for input(s): PROCALCITON, LATICACIDVEN in the last 168 hours.  No results found for this or any previous visit (from the past 240 hour(s)).       Radiology Studies: No results found.      Scheduled Meds: . atorvastatin  80 mg Oral Q0600  . carvedilol  6.25 mg Oral BID WC  . enoxaparin (LOVENOX) injection  40 mg Subcutaneous Q24H  . levothyroxine  125 mcg Oral QAC breakfast  . lisinopril  2.5 mg Oral Daily  . mouth rinse  15 mL Mouth Rinse BID  . sodium chloride flush  3 mL Intravenous Q12H  . sodium chloride flush  3 mL Intravenous Q12H   Continuous Infusions: . sodium chloride    . sodium chloride    . sodium chloride       LOS: 4 days    Time spent: 30 minutes    Fayrene Helper, MD [facility] [phone]   If 7PM-7AM, please contact night-coverage [email or web address] Password [facility] 07/09/2017, 10:44 AM

## 2017-07-09 NOTE — Interval H&P Note (Signed)
Cath Lab Visit (complete for each Cath Lab visit)  Clinical Evaluation Leading to the Procedure:   ACS: No.  Non-ACS:    Anginal Classification: CCS II  Anti-ischemic medical therapy: Minimal Therapy (1 class of medications)  Non-Invasive Test Results: No non-invasive testing performed  Prior CABG: No previous CABG      History and Physical Interval Note:  07/09/2017 1:17 PM  Brittney Ross  has presented today for surgery, with the diagnosis of hf  The various methods of treatment have been discussed with the patient and family. After consideration of risks, benefits and other options for treatment, the patient has consented to  Procedure(s): RIGHT/LEFT HEART CATH AND CORONARY ANGIOGRAPHY (N/A) as a surgical intervention .  The patient's history has been reviewed, patient examined, no change in status, stable for surgery.  I have reviewed the patient's chart and labs.  Questions were answered to the patient's satisfaction.     Shelva Majestic

## 2017-07-09 NOTE — H&P (View-Only) (Signed)
Progress Note  Patient Name: Brittney Ross Date of Encounter: 07/08/2017  Primary Cardiologist: new - Dr. Acie Fredrickson  Subjective   Pt was able to sleep flat last night without 2 pillows. She is breathing well, no chest pain or palpitations.  Inpatient Medications    Scheduled Meds: . carvedilol  6.25 mg Oral BID WC  . enoxaparin (LOVENOX) injection  40 mg Subcutaneous Q24H  . furosemide  40 mg Intravenous Q12H  . levothyroxine  125 mcg Oral QAC breakfast  . lisinopril  2.5 mg Oral Daily  . mouth rinse  15 mL Mouth Rinse BID  . potassium chloride  30 mEq Oral BID  . sodium chloride flush  3 mL Intravenous Q12H   Continuous Infusions: . sodium chloride     PRN Meds: sodium chloride, acetaminophen, albuterol, ALPRAZolam, hydrALAZINE, HYDROcodone-acetaminophen, ondansetron (ZOFRAN) IV, sodium chloride flush   Vital Signs    Vitals:   07/07/17 2315 07/08/17 0541 07/08/17 0700 07/08/17 0748  BP: (!) 117/55 (!) 111/59  111/65  Pulse: 81 77 75 75  Resp: 18 18  20   Temp: 98.5 F (36.9 C) 98.5 F (36.9 C)  98.4 F (36.9 C)  TempSrc: Oral Oral  Oral  SpO2: 94% 99% 97% 97%  Weight:  284 lb 1.6 oz (128.9 kg)    Height:        Intake/Output Summary (Last 24 hours) at 07/08/17 1022 Last data filed at 07/08/17 0900  Gross per 24 hour  Intake              240 ml  Output             1400 ml  Net            -1160 ml   Filed Weights   07/06/17 0500 07/07/17 0544 07/08/17 0541  Weight: 285 lb 0.9 oz (129.3 kg) 284 lb 8 oz (129 kg) 284 lb 1.6 oz (128.9 kg)     Physical Exam   General: Well developed, well nourished, female appearing in no acute distress. Head: Normocephalic, atraumatic.  Neck: Supple without bruits, no JVD Lungs:  Resp regular and unlabored, CTA. Heart: RRR, S1, S2, no murmur; no rub. Abdomen: Soft, non-tender, non-distended with normoactive bowel sounds. No hepatomegaly. No rebound/guarding. No obvious abdominal masses. Extremities: No clubbing, cyanosis,  trace edema. Distal pedal pulses are 2+ bilaterally. Neuro: Alert and oriented X 3. Moves all extremities spontaneously. Psych: Normal affect.  Labs    Chemistry Recent Labs Lab 07/06/17 0446 07/07/17 0450 07/08/17 0436  NA 142 139 141  K 3.5 3.4* 4.0  CL 107 104 105  CO2 25 26 27   GLUCOSE 97 112* 123*  BUN 15 21* 24*  CREATININE 0.80 0.94 1.12*  CALCIUM 9.0 9.0 9.2  PROT  --  7.8  --   ALBUMIN  --  3.5  --   AST  --  18  --   ALT  --  13*  --   ALKPHOS  --  60  --   BILITOT  --  0.9  --   GFRNONAA >60 >60 54*  GFRAA >60 >60 >60  ANIONGAP 10 9 9      Hematology Recent Labs Lab 07/05/17 1545 07/07/17 0450  WBC 12.4* 8.9  RBC 4.42 4.43  HGB 12.3 12.3  HCT 38.4 38.5  MCV 86.9 86.9  MCH 27.8 27.8  MCHC 32.0 31.9  RDW 15.0 15.1  PLT 182 185    Cardiac EnzymesNo results for input(s):  TROPONINI in the last 168 hours. No results for input(s): TROPIPOC in the last 168 hours.   BNP Recent Labs Lab 07/05/17 1545  BNP 953.6*     DDimer No results for input(s): DDIMER in the last 168 hours.   Radiology    No results found.   Telemetry    Sinus with frequent PVCs - Personally Reviewed  ECG    No new tracings - Personally Reviewed   Cardiac Studies   Echocardiogram 07/07/17: Study Conclusions - Left ventricle: The cavity size was mildly dilated. Systolic function was severely reduced. The estimated ejection fraction was in the range of 20% to 25%. Severe diffuse hypokinesis with no identifiable regional variations. Doppler parameters are consistent with abnormal left ventricular relaxation (grade 1 diastolic dysfunction). Acoustic contrast opacification revealed no evidence ofthrombus. - Mitral valve: There was mild regurgitation. - Left atrium: The atrium was moderately dilated.  Patient Profile     56 y.o. female with a hx of thyroid disease, HTN, HLD, obesity, and anemia who is being seen for the evaluation of newly diagnosed  systolic and diastolic heart failure   Assessment & Plan    1. Newly diagnosed systolic and diastolic heart failure - she is diuresing well on 40 mg IV lasix BID - decreased potassium supplement to 20 mEq Kdur BID - K 4.0 (3.4) - she is overall net negative 2.2 L with 1.1 L urine output yesterday - weight on admission 286 lbs, now 284 lbs - etiology for this new onset combined heart failure unknown. Will plan for right and left heart cath tomorrow.    2. AKI - sCr 1.12 (0.94) - making good urine - hold evening lasix today and tomorrow doses for heart cath - gentle hydration - check BMET tomorrow morning   Signed, Ledora Bottcher , PA-C 10:22 AM 07/08/2017 Pager: [phone]   Attending Note:   The patient was seen and examined.  Agree with assessment and plan as noted above.  Changes made to the above note as needed.  Patient seen and independently examined with Doreene Adas, PA .   We discussed all aspects of the encounter. I agree with the assessment and plan as stated above.  1.   Acute systolic chf:   Able to lie flat with out too much trouble. Scheduled for cath tomorrow . Creatinine is up sligtly.   Agree with holding lasix tonight . Hydration in the am     I have spent a total of 40 minutes with patient reviewing hospital  notes , telemetry, EKGs, labs and examining patient as well as establishing an assessment and plan that was discussed with the patient. > 50% of time was spent in direct patient care.    Thayer Headings, Brooke Bonito., MD, [facility] 07/08/2017, 12:35 PM [address]  [place] Pager [phone]

## 2017-07-09 NOTE — Progress Notes (Signed)
TR BAND REMOVAL  LOCATION:    Radial rt radial  DEFLATED PER PROTOCOL:   yes  TIME BAND OFF / DRESSING APPLIED:    1615/gauze and tegaderm  SITE UPON ARRIVAL:    Level 0  SITE AFTER BAND REMOVAL:    Level 0  CIRCULATION SENSATION AND MOVEMENT:    Within Normal Limits :  yes  COMMENTS:

## 2017-07-10 ENCOUNTER — Encounter (HOSPITAL_COMMUNITY): Payer: Self-pay | Admitting: Cardiovascular Disease

## 2017-07-10 DIAGNOSIS — I5041 Acute combined systolic (congestive) and diastolic (congestive) heart failure: Secondary | ICD-10-CM

## 2017-07-10 DIAGNOSIS — E782 Mixed hyperlipidemia: Secondary | ICD-10-CM

## 2017-07-10 DIAGNOSIS — E890 Postprocedural hypothyroidism: Secondary | ICD-10-CM

## 2017-07-10 LAB — BASIC METABOLIC PANEL
ANION GAP: 8 (ref 5–15)
BUN: 20 mg/dL (ref 6–20)
CALCIUM: 8.9 mg/dL (ref 8.9–10.3)
CHLORIDE: 107 mmol/L (ref 101–111)
CO2: 25 mmol/L (ref 22–32)
Creatinine, Ser: 0.87 mg/dL (ref 0.44–1.00)
GFR calc non Af Amer: >60 mL/min (ref >60)
Glucose, Bld: 108 mg/dL — ABNORMAL HIGH (ref 65–99)
POTASSIUM: 3.8 mmol/L (ref 3.5–5.1)
Sodium: 140 mmol/L (ref 135–145)

## 2017-07-10 LAB — LIPID PANEL
Cholesterol: 228 mg/dL — ABNORMAL HIGH (ref 0–200)
HDL: 29 mg/dL — AB (ref >40)
LDL CALC: 181 mg/dL — AB (ref 0–99)
TRIGLYCERIDES: 92 mg/dL (ref <150)
Total CHOL/HDL Ratio: 7.9 RATIO
VLDL: 18 mg/dL (ref 0–40)

## 2017-07-10 LAB — CBC
HEMATOCRIT: 35.7 % — AB (ref 36.0–46.0)
HEMOGLOBIN: 11.3 g/dL — AB (ref 12.0–15.0)
MCH: 27.7 pg (ref 26.0–34.0)
MCHC: 31.7 g/dL (ref 30.0–36.0)
MCV: 87.5 fL (ref 78.0–100.0)
Platelets: 190 10*3/uL (ref 150–400)
RBC: 4.08 MIL/uL (ref 3.87–5.11)
RDW: 15.1 % (ref 11.5–15.5)
WBC: 5.8 10*3/uL (ref 4.0–10.5)

## 2017-07-10 MED ORDER — CARVEDILOL 6.25 MG PO TABS: 6.2500 mg | ORAL_TABLET | Freq: Two times a day (BID) | ORAL | 0 refills | Status: DC

## 2017-07-10 MED ORDER — ATORVASTATIN CALCIUM 40 MG PO TABS: 80.0000 mg | ORAL_TABLET | Freq: Every day | ORAL | 0 refills | Status: DC

## 2017-07-10 MED ORDER — FUROSEMIDE 40 MG PO TABS
40.0000 mg | ORAL_TABLET | Freq: Every day | ORAL | Status: DC
  Administered 2017-07-10: 40 mg via ORAL
  Filled 2017-07-10: qty 1

## 2017-07-10 MED ORDER — LOSARTAN POTASSIUM 25 MG PO TABS
25.0000 mg | ORAL_TABLET | Freq: Every day | ORAL | Status: DC
  Administered 2017-07-10: 25 mg via ORAL
  Filled 2017-07-10: qty 1

## 2017-07-10 MED ORDER — POTASSIUM CHLORIDE CRYS ER 20 MEQ PO TBCR: 20.0000 meq | EXTENDED_RELEASE_TABLET | Freq: Every day | ORAL | 0 refills | Status: DC

## 2017-07-10 MED ORDER — FUROSEMIDE 40 MG PO TABS: 40.0000 mg | ORAL_TABLET | Freq: Every day | ORAL | 0 refills | Status: DC

## 2017-07-10 MED ORDER — POTASSIUM CHLORIDE CRYS ER 20 MEQ PO TBCR
20.0000 meq | EXTENDED_RELEASE_TABLET | Freq: Every day | ORAL | Status: DC
  Administered 2017-07-10: 20 meq via ORAL
  Filled 2017-07-10: qty 1

## 2017-07-10 MED ORDER — LOSARTAN POTASSIUM 25 MG PO TABS: 25.0000 mg | ORAL_TABLET | Freq: Every day | ORAL | 0 refills | Status: DC

## 2017-07-10 NOTE — Care Management Note (Signed)
Case Management Note  Patient Details  Name: Brittney Ross MRN: [id number] Date of Birth: Dec 28, 1960  Subjective/Objective:No PT f/u. Spoke to patient on phone about script coverage-she has script coverage-she will have to pay her obligated co pay-patient voiced understanding.No further Cm needs.                    Action/Plan:d/c home.   Expected Discharge Date:                  Expected Discharge Plan:  Home/Self Care  In-House Referral:  NA  Discharge planning Services  CM Consult  Post Acute Care Choice:    Choice offered to:  Patient  DME Arranged:    DME Agency:     HH Arranged:    [place] Agency:     Status of Service:  Completed, signed off  If discussed at [employer] of Stay Meetings, dates discussed:    Additional Comments:  Dessa Phi, RN 07/10/2017, 11:37 AM

## 2017-07-10 NOTE — Progress Notes (Signed)
PROGRESS NOTE    Brittney Ross  ERX:[id number] DOB: 1961/02/22 DOA: 07/05/2017 PCP: Patient, No Pcp Per    Brief Narrative:  56 yo with hx of hypothyroidism, morbid obesity presenting with SOB, cough, orthopnea and PND found to have acute on chronic CHF.     Assessment & Plan:   Principal Problem:   Acute CHF (congestive heart failure) (HCC) Active Problems:   Hypothyroidism, postablative   HYPERTENSION, BENIGN ESSENTIAL   Hypokalemia   Acute pulmonary edema (HCC)   Dilated cardiomyopathy ([place])   1. Acute systolic CHF Morbid obesity Echocardiogram was 20-25% EF. Diffuse wall motion abnormality. Cath with dilated nonischemic cardiomyopathy with mild nonobstructive disease, recommend guideline directed medical therapy PO lasix 40 mg daily Lisinopril switched to losartan per cards On carvedilol Family history of coronary artery disease in mother. Appreciate cards recs  2. Elevated Creatinine: improved  3. Hypothyroidism. Continue Synthroid. TSH high 8.462, T4 within normal, but 2 weeks ago normal.  Repeat outpatient.     4. Hypokalemia. Replacing.  5. Leukocytosis. improved.   DVT prophylaxis: lovenox Code Status: full  Family Communication: none in room Disposition Plan: pending cardiology cath   Consultants:   cardiology  Procedures: (Don't include imaging studies which can be auto populated. Include things that cannot be auto populated i.e. Echo, Carotid and venous dopplers, Foley, Bipap, HD, tubes/drains, wound vac, central lines etc)  echo  Antimicrobials: (specify start and planned stop date. Auto populated tables are space occupying and do not give end dates)  none    Subjective: Feeling better.   Objective: Vitals:   07/09/17 1600 07/09/17 1655 07/09/17 2246 07/10/17 0610  BP: (!) 161/61 140/60 (!) 123/56 (!) 144/57  Pulse: 77 75 63 79  Resp: (!) 24 (!) 22 18 20   Temp:  98.1 F (36.7 C) 98.5 F (36.9 C) 98.2 F (36.8 C)  TempSrc:   Oral Oral Oral  SpO2: 100% 100% 100% 97%  Weight:    132 kg (290 lb 14.4 oz)  Height:        Intake/Output Summary (Last 24 hours) at 07/10/17 0947 Last data filed at 07/10/17 9509  Gross per 24 hour  Intake                0 ml  Output              300 ml  Net             -300 ml   Filed Weights   07/07/17 0544 07/08/17 0541 07/10/17 0610  Weight: 129 kg (284 lb 8 oz) 128.9 kg (284 lb 1.6 oz) 132 kg (290 lb 14.4 oz)    Examination:  General: No acute distress. Cardiovascular: Heart sounds show Brittney Ross regular rate, and rhythm. No gallops or rubs. No murmurs. No JVD. Lungs: Clear to auscultation bilaterally with good air movement. No rales, rhonchi or wheezes. Abdomen: Soft, nontender, nondistended with normal active bowel sounds. No masses. No hepatosplenomegaly. Neurological: Alert and oriented 3. Moves all extremities 4 with equal strength. Cranial nerves II through XII grossly intact. Skin: Warm and dry. No rashes or lesions. Extremities: No clubbing or cyanosis. No edema. Pedal pulses 2+. Psychiatric: Mood and affect are normal. Insight and judgment are appropirate.   Data Reviewed: I have personally reviewed following labs and imaging studies  CBC:  Recent Labs Lab 07/05/17 1545 07/07/17 0450 07/09/17 0414 07/10/17 0419  WBC 12.4* 8.9 6.6 5.8  NEUTROABS 9.2* 5.0  --   --  HGB 12.3 12.3 11.5* 11.3*  HCT 38.4 38.5 36.4 35.7*  MCV 86.9 86.9 86.3 87.5  PLT 182 185 195 009   Basic Metabolic Panel:  Recent Labs Lab 07/06/17 0446 07/07/17 0450 07/08/17 0436 07/09/17 0414 07/10/17 0419  NA 142 139 141 141 140  K 3.5 3.4* 4.0 3.9 3.8  CL 107 104 105 106 107  CO2 25 26 27 25 25   GLUCOSE 97 112* 123* 106* 108*  BUN 15 21* 24* 23* 20  CREATININE 0.80 0.94 1.12* 0.88 0.87  CALCIUM 9.0 9.0 9.2 9.0 8.9  MG 2.2 2.0  --   --   --    GFR: Estimated Creatinine Clearance: 100.3 mL/min (by C-G formula based on SCr of 0.87 mg/dL). Liver Function Tests:  Recent  Labs Lab 07/07/17 0450  AST 18  ALT 13*  ALKPHOS 60  BILITOT 0.9  PROT 7.8  ALBUMIN 3.5   No results for input(s): LIPASE, AMYLASE in the last 168 hours. No results for input(s): AMMONIA in the last 168 hours. Coagulation Profile:  Recent Labs Lab 07/09/17 0414  INR 1.05   Cardiac Enzymes: No results for input(s): CKTOTAL, CKMB, CKMBINDEX, TROPONINI in the last 168 hours. BNP (last 3 results) No results for input(s): PROBNP in the last 8760 hours. HbA1C: No results for input(s): HGBA1C in the last 72 hours. CBG: No results for input(s): GLUCAP in the last 168 hours. Lipid Profile: No results for input(s): CHOL, HDL, LDLCALC, TRIG, CHOLHDL, LDLDIRECT in the last 72 hours. Thyroid Function Tests:  Recent Labs  07/08/17 0436  TSH 8.462*  FREET4 1.01   Anemia Panel: No results for input(s): VITAMINB12, FOLATE, FERRITIN, TIBC, IRON, RETICCTPCT in the last 72 hours. Sepsis Labs: No results for input(s): PROCALCITON, LATICACIDVEN in the last 168 hours.  No results found for this or any previous visit (from the past 240 hour(s)).       Radiology Studies: No results found.      Scheduled Meds: . atorvastatin  80 mg Oral Q0600  . carvedilol  6.25 mg Oral BID WC  . enoxaparin (LOVENOX) injection  40 mg Subcutaneous Q24H  . furosemide  40 mg Oral Daily  . levothyroxine  125 mcg Oral QAC breakfast  . losartan  25 mg Oral Daily  . mouth rinse  15 mL Mouth Rinse BID  . potassium chloride  20 mEq Oral Daily  . sodium chloride flush  3 mL Intravenous Q12H  . sodium chloride flush  3 mL Intravenous Q12H   Continuous Infusions: . sodium chloride    . sodium chloride       LOS: 5 days    Time spent: 30 minutes    Brittney Helper, MD [facility] [phone]   If 7PM-7AM, please contact night-coverage [email or web address] Password [email or web address] 07/10/2017, 9:47 AM

## 2017-07-10 NOTE — Discharge Summary (Signed)
Physician Discharge Summary  Brittney Ross [id number]:[id number] DOB: 08/06/1961 DOA: 07/05/2017  PCP: Patient, No Pcp Per, will f/u with [facility]  Admit date: 07/05/2017 Discharge date: 07/10/2017  Time spent: over 30 minutes  Recommendations for Outpatient Follow-up:  1. Follow up outpatient CBC/BMP 2. Follow up volume status and stability on 40 mg PO lasix daily (as well as creatinine) 3. Follow up lipid panel, she was started on atorvastatin at 80 mg daily here, but this may be able to be changed given NICM based on ascvd risk   4. Follow up repeat TSH as outpatient in about 6 weeks (TSH elevated here, but 2 weeks ago normal)  Discharge Diagnoses:  Principal Problem:   Acute CHF (congestive heart failure) (HCC) Active Problems:   Hypothyroidism, postablative   HYPERTENSION, BENIGN ESSENTIAL   Hypokalemia   Acute pulmonary edema (HCC)   Dilated cardiomyopathy ([place])   Discharge Condition: stable  Diet recommendation: low sodium, heart healthy  Filed Weights   07/07/17 0544 07/08/17 0541 07/10/17 0610  Weight: 129 kg (284 lb 8 oz) 128.9 kg (284 lb 1.6 oz) 132 kg (290 lb 14.4 oz)    History of present illness:  56 yo with hx of hypothyroidism, morbid obesity presenting with SOB, cough, orthopnea and PND found to have acute on chronic CHF.    Received IV diuresis with improvement in sx.  Transitioned to PO.  Cath performed as noted below.   Hospital Course:  1. Acute systolic CHF Morbid obesity Echocardiogram was 20-25% EF. Diffuse wall motion abnormality. Cath with dilated nonischemic cardiomyopathy with mild nonobstructive disease, recommend guideline directed medical therapy PO lasix 40 mg daily Lisinopril switched to losartan per cards On carvedilol Family history of coronary artery disease in mother. Appreciate cards recs  2. Elevated Creatinine: improved  3. Hypothyroidism. Continue Synthroid. TSH high 8.462, T4 within normal, but 2 weeks ago  normal.  Repeat outpatient.     4. Hypokalemia. Replacing.  5. Leukocytosis. improved.   Procedures:  echo - EF 20-25%, severe diffuse hypokinesis, grade 1 diastolic dysfunction (see report)   Cath - Dilated nonischemic cardiomyopathy with mild nonobstructive disease of 40 and 30% involving the first diagonal branch in Brittney Ross twin like LAD system; normal circumflex and right coronary arteries.  LVEDP 29 mm.    Consultations:  cardiology  Discharge Exam: Vitals:   07/10/17 0610 07/10/17 1432  BP: (!) 144/57 (!) 123/52  Pulse: 79 73  Resp: 20 20  Temp: 98.2 F (36.8 C) 98.4 F (36.9 C)  SpO2: 97% 100%   Feeling well, wants to go home.  No SOB.  Orthopnea better.   General: No acute distress. Cardiovascular: Heart sounds show Kimmy Parish regular rate, and rhythm. No gallops or rubs. No murmurs. No JVD. Lungs: Clear to auscultation bilaterally with good air movement. No rales, rhonchi or wheezes. Abdomen: Soft, nontender, nondistended with normal active bowel sounds. No masses. No hepatosplenomegaly. Neurological: Alert and oriented 3. Moves all extremities 4 with equal strength. Cranial nerves II through XII grossly intact. Skin: Warm and dry. No rashes or lesions. Extremities: No clubbing or cyanosis. No edema. Pedal pulses 2+. Psychiatric: Mood and affect are normal. Insight and judgment are appropriate.  Discharge Instructions   Discharge Instructions    Call MD for:  difficulty breathing, headache or visual disturbances    Complete by:  As directed    Call MD for:  persistant nausea and vomiting    Complete by:  As directed  Call MD for:  redness, tenderness, or signs of infection (pain, swelling, redness, odor or green/yellow discharge around incision site)    Complete by:  As directed    Call MD for:  severe uncontrolled pain    Complete by:  As directed    Call MD for:  temperature >100.4    Complete by:  As directed    Diet - low sodium heart healthy    Complete  by:  As directed    Discharge instructions    Complete by:  As directed    You were seen for heart failure.  We've started you on several new medicines (losartan, carvedilol, lasix).  Please continue these as prescribed.  You were also started on atorvastatin, we've added on Noelie Renfrow cholesterol panel and your follow up doctor will determine whether this dose is appropriate or should be changed.  Please stop any pseudoephedrine containing medicines.  Please avoid NSAIDs (ibuprofen, naproxen) as well.   Increase activity slowly    Complete by:  As directed      Discharge Medication List as of 07/10/2017  4:37 PM    START taking these medications   Details  atorvastatin (LIPITOR) 40 MG tablet Take 2 tablets (80 mg total) by mouth daily at 6 (six) AM., Starting Sat 07/11/2017, Normal    carvedilol (COREG) 6.25 MG tablet Take 1 tablet (6.25 mg total) by mouth 2 (two) times daily with Laylonie Marzec meal., Starting Fri 07/10/2017, Until Tue 09/08/2017, Normal    furosemide (LASIX) 40 MG tablet Take 1 tablet (40 mg total) by mouth daily., Starting Sat 07/11/2017, Normal    losartan (COZAAR) 25 MG tablet Take 1 tablet (25 mg total) by mouth daily., Starting Sat 07/11/2017, Normal    potassium chloride SA (K-DUR,KLOR-CON) 20 MEQ tablet Take 1 tablet (20 mEq total) by mouth daily., Starting Sat 07/11/2017, Normal      CONTINUE these medications which have NOT CHANGED   Details  albuterol (PROVENTIL HFA;VENTOLIN HFA) 108 (90 Base) MCG/ACT inhaler Inhale 2 puffs into the lungs every 6 (six) hours as needed for wheezing or shortness of breath., Historical Med    levothyroxine (SYNTHROID, LEVOTHROID) 125 MCG tablet Take 1 tablet (125 mcg total) by mouth daily., Starting Mon 06/15/2017, Normal      STOP taking these medications     naproxen (NAPROSYN) 500 MG tablet      pseudoephedrine-acetaminophen (TYLENOL SINUS) 30-500 MG TABS tablet        No Known Allergies Follow-up Information    [facility] Follow up on 07/17/2017.   Specialty:  Internal Medicine Why:  Appointment at 10:00 AM. Please keep appointment.  Contact information: [place]       Nahser, Wonda Cheng, MD Follow up.   Specialty:  Cardiology Contact information: [place] [address] [phone]            The results of significant diagnostics from this hospitalization (including imaging, microbiology, ancillary and laboratory) are listed below for reference.    Significant Diagnostic Studies: Dg Chest 2 View  Result Date: 07/05/2017 CLINICAL DATA:  Acute shortness of breath. EXAM: CHEST  2 VIEW COMPARISON:  06/17/2017 FINDINGS: Cardiomegaly again noted. Bilateral central airspace opacities are identified, moderate on the right and mild on the left. No definite pleural effusion or pneumothorax. No acute bony abnormalities are identified. IMPRESSION: Bilateral central airspace opacities, right greater than left, favor pulmonary edema  over infection. Cardiomegaly. Electronically Signed   By: Margarette Canada M.D.   On: 07/05/2017 13:59   Dg Chest 2 View  Result Date: 06/17/2017 CLINICAL DATA:  Nonproductive cough and dyspnea, onset 1 week ago. EXAM: CHEST  2 VIEW COMPARISON:  10/09/2016. FINDINGS: Moderately large cardiac silhouette. The lungs are clear. The pulmonary vasculature is normal. No pleural effusion. Unremarkable hilar and mediastinal contours. IMPRESSION: Moderate enlargement of the cardiac silhouette, probably unchanged from 10/09/2016. No consolidation. No pleural effusion Electronically Signed   By: Andreas Newport M.D.   On: 06/17/2017 02:00    Microbiology: No results found for this or any previous visit (from the past 240 hour(s)).   Labs: Basic Metabolic Panel:  Recent Labs Lab 07/06/17 0446 07/07/17 0450 07/08/17 0436 07/09/17 0414 07/10/17 0419  NA 142 139 141 141 140  K 3.5 3.4* 4.0 3.9 3.8  CL  107 104 105 106 107  CO2 25 26 27 25 25   GLUCOSE 97 112* 123* 106* 108*  BUN 15 21* 24* 23* 20  CREATININE 0.80 0.94 1.12* 0.88 0.87  CALCIUM 9.0 9.0 9.2 9.0 8.9  MG 2.2 2.0  --   --   --    Liver Function Tests:  Recent Labs Lab 07/07/17 0450  AST 18  ALT 13*  ALKPHOS 60  BILITOT 0.9  PROT 7.8  ALBUMIN 3.5   No results for input(s): LIPASE, AMYLASE in the last 168 hours. No results for input(s): AMMONIA in the last 168 hours. CBC:  Recent Labs Lab 07/05/17 1545 07/07/17 0450 07/09/17 0414 07/10/17 0419  WBC 12.4* 8.9 6.6 5.8  NEUTROABS 9.2* 5.0  --   --   HGB 12.3 12.3 11.5* 11.3*  HCT 38.4 38.5 36.4 35.7*  MCV 86.9 86.9 86.3 87.5  PLT 182 185 195 190   Cardiac Enzymes: No results for input(s): CKTOTAL, CKMB, CKMBINDEX, TROPONINI in the last 168 hours. BNP: BNP (last 3 results)  Recent Labs  07/05/17 1545  BNP 953.6*    ProBNP (last 3 results) No results for input(s): PROBNP in the last 8760 hours.  CBG: No results for input(s): GLUCAP in the last 168 hours.     Signed:  Fayrene Helper MD.  [facility] 07/10/2017, 7:27 PM

## 2017-07-10 NOTE — Progress Notes (Signed)
Progress Note  Patient Name: Brittney Ross Date of Encounter: 07/10/2017  Primary Cardiologist: New, Jalesha Plotz   Subjective   56 year old female with a history of obesity, hypertension,  anemia and acute on chronic systolic congestive heart failure.  Cardiac catheterization on September 20 reveals nonobstructive coronary artery disease.   Inpatient Medications    Scheduled Meds: . atorvastatin  80 mg Oral Q0600  . carvedilol  6.25 mg Oral BID WC  . enoxaparin (LOVENOX) injection  40 mg Subcutaneous Q24H  . levothyroxine  125 mcg Oral QAC breakfast  . lisinopril  2.5 mg Oral Daily  . mouth rinse  15 mL Mouth Rinse BID  . sodium chloride flush  3 mL Intravenous Q12H  . sodium chloride flush  3 mL Intravenous Q12H   Continuous Infusions: . sodium chloride    . sodium chloride     PRN Meds: sodium chloride, sodium chloride, acetaminophen, acetaminophen, albuterol, ALPRAZolam, diazepam, hydrALAZINE, HYDROcodone-acetaminophen, ondansetron (ZOFRAN) IV, ondansetron (ZOFRAN) IV, sodium chloride flush, sodium chloride flush   Vital Signs    Vitals:   07/09/17 1600 07/09/17 1655 07/09/17 2246 07/10/17 0610  BP: (!) 161/61 140/60 (!) 123/56 (!) 144/57  Pulse: 77 75 63 79  Resp: (!) 24 (!) 22 18 20   Temp:  98.1 F (36.7 C) 98.5 F (36.9 C) 98.2 F (36.8 C)  TempSrc:  Oral Oral Oral  SpO2: 100% 100% 100% 97%  Weight:    290 lb 14.4 oz (132 kg)  Height:        Intake/Output Summary (Last 24 hours) at 07/10/17 0744 Last data filed at 07/10/17 1025  Gross per 24 hour  Intake                0 ml  Output              300 ml  Net             -300 ml   Filed Weights   07/07/17 0544 07/08/17 0541 07/10/17 0610  Weight: 284 lb 8 oz (129 kg) 284 lb 1.6 oz (128.9 kg) 290 lb 14.4 oz (132 kg)    Telemetry    NSR  - Personally Reviewed  ECG     NSR  - Personally Reviewed  Physical Exam   GEN: No acute distress.   Morbidly obese female. She is in no acute distress. Neck:  No JVD Cardiac: RRR, no murmurs, rubs, or gallops.  Respiratory: Clear to auscultation bilaterally. GI: Soft, nontender, non-distended  MS: No edema; No deformity. Neuro:  Nonfocal  Psych: Normal affect   Labs    Chemistry Recent Labs Lab 07/07/17 0450 07/08/17 0436 07/09/17 0414 07/10/17 0419  NA 139 141 141 140  K 3.4* 4.0 3.9 3.8  CL 104 105 106 107  CO2 26 27 25 25   GLUCOSE 112* 123* 106* 108*  BUN 21* 24* 23* 20  CREATININE 0.94 1.12* 0.88 0.87  CALCIUM 9.0 9.2 9.0 8.9  PROT 7.8  --   --   --   ALBUMIN 3.5  --   --   --   AST 18  --   --   --   ALT 13*  --   --   --   ALKPHOS 60  --   --   --   BILITOT 0.9  --   --   --   GFRNONAA >60 54* >60 >60  GFRAA >60 >60 >60 >60  ANIONGAP 9 9 10  8  Hematology Recent Labs Lab 07/07/17 0450 07/09/17 0414 07/10/17 0419  WBC 8.9 6.6 5.8  RBC 4.43 4.22 4.08  HGB 12.3 11.5* 11.3*  HCT 38.5 36.4 35.7*  MCV 86.9 86.3 87.5  MCH 27.8 27.3 27.7  MCHC 31.9 31.6 31.7  RDW 15.1 15.1 15.1  PLT 185 195 190    Cardiac EnzymesNo results for input(s): TROPONINI in the last 168 hours. No results for input(s): TROPIPOC in the last 168 hours.   BNP Recent Labs Lab 07/05/17 1545  BNP 953.6*     DDimer No results for input(s): DDIMER in the last 168 hours.   Radiology    No results found.  Cardiac Studies     Patient Profile     56 y.o. female with acute on chronic combined systolic and diastolic congestive heart failure  Assessment & Plan    1. Acute on chronic systolic and diastolic congestive heart failure:   There is no evidence of obstructive coronary artery disease to explain her CHF.   Her left ventricular end-diastolic pressure was 29 mmHg. We'll continue medical therapy. She's currently on carvedilol 6.25 mg twice a day, lisinopril 2.5 mg a day Will discontinue the lisinopril and start losartan 25 mg a day Start Lasix 40 mg a day. Potassium chloride 20 mEq a day  I stressed the importance of a low  salt diet. She also needs to work on weight loss program.   Her medications include pseudoephedrine with acetaminophen every 4 hours as needed. It will be important for her to avoid pseudoephedrine.  2. Hyperlipidemia: Continue atorvastatin   For questions or updates, please contact [place] Please consult [email or web address] for contact info under Cardiology/STEMI.      Signed, Mertie Moores, MD  07/10/2017, 7:44 AM

## 2017-07-17 ENCOUNTER — Ambulatory Visit (INDEPENDENT_AMBULATORY_CARE_PROVIDER_SITE_OTHER): Payer: BLUE CROSS/BLUE SHIELD | Admitting: Family Medicine

## 2017-07-17 ENCOUNTER — Encounter: Payer: Self-pay | Admitting: Family Medicine

## 2017-07-17 VITALS — BP 117/60 | HR 65 | Temp 98.3°F | Resp 14 | Ht 65.0 in | Wt 288.0 lb

## 2017-07-17 DIAGNOSIS — I509 Heart failure, unspecified: Secondary | ICD-10-CM

## 2017-07-17 DIAGNOSIS — I10 Essential (primary) hypertension: Secondary | ICD-10-CM

## 2017-07-17 DIAGNOSIS — E890 Postprocedural hypothyroidism: Secondary | ICD-10-CM

## 2017-07-17 DIAGNOSIS — E784 Other hyperlipidemia: Secondary | ICD-10-CM | POA: Diagnosis not present

## 2017-07-17 DIAGNOSIS — E7849 Other hyperlipidemia: Secondary | ICD-10-CM

## 2017-07-17 LAB — COMPLETE METABOLIC PANEL WITH GFR
AG RATIO: 1.1 (calc) (ref 1.0–2.5)
ALBUMIN MSPROF: 3.5 g/dL — AB (ref 3.6–5.1)
ALT: 18 U/L (ref 6–29)
AST: 15 U/L (ref 10–35)
Alkaline phosphatase (APISO): 65 U/L (ref 33–130)
BUN: 22 mg/dL (ref 7–25)
CALCIUM: 8.9 mg/dL (ref 8.6–10.4)
CO2: 29 mmol/L (ref 20–32)
CREATININE: 0.82 mg/dL (ref 0.50–1.05)
Chloride: 108 mmol/L (ref 98–110)
GFR, EST NON AFRICAN AMERICAN: 81 mL/min/{1.73_m2} (ref >=60)
GFR, Est African American: 93 mL/min/{1.73_m2} (ref >=60)
GLOBULIN: 3.2 g/dL (ref 1.9–3.7)
Glucose, Bld: 114 mg/dL — ABNORMAL HIGH (ref 65–99)
POTASSIUM: 4.3 mmol/L (ref 3.5–5.3)
SODIUM: 143 mmol/L (ref 135–146)
TOTAL PROTEIN: 6.7 g/dL (ref 6.1–8.1)
Total Bilirubin: 0.4 mg/dL (ref 0.2–1.2)

## 2017-07-17 LAB — POCT URINALYSIS DIP (DEVICE)
Bilirubin Urine: NEGATIVE
Glucose, UA: NEGATIVE mg/dL
HGB URINE DIPSTICK: NEGATIVE
Ketones, ur: NEGATIVE mg/dL
LEUKOCYTES UA: NEGATIVE
NITRITE: NEGATIVE
PROTEIN: NEGATIVE mg/dL
SPECIFIC GRAVITY, URINE: 1.025 (ref 1.005–1.030)
UROBILINOGEN UA: 0.2 mg/dL (ref 0.0–1.0)
pH: 5 (ref 5.0–8.0)

## 2017-07-17 LAB — CBC WITH DIFFERENTIAL/PLATELET
BASOS ABS: 49 {cells}/uL (ref 0–200)
BASOS PCT: 0.6 %
EOS ABS: 82 {cells}/uL (ref 15–500)
Eosinophils Relative: 1 %
HCT: 38.1 % (ref 35.0–45.0)
Hemoglobin: 12 g/dL (ref 11.7–15.5)
Lymphs Abs: 2460 cells/uL (ref 850–3900)
MCH: 27.4 pg (ref 27.0–33.0)
MCHC: 31.5 g/dL — AB (ref 32.0–36.0)
MCV: 87 fL (ref 80.0–100.0)
MPV: 13.1 fL — AB (ref 7.5–12.5)
Monocytes Relative: 9.3 %
NEUTROS PCT: 59.1 %
Neutro Abs: 4846 cells/uL (ref 1500–7800)
PLATELETS: 185 10*3/uL (ref 140–400)
RBC: 4.38 10*6/uL (ref 3.80–5.10)
RDW: 13.3 % (ref 11.0–15.0)
TOTAL LYMPHOCYTE: 30 %
WBC: 8.2 10*3/uL (ref 3.8–10.8)
WBCMIX: 763 {cells}/uL (ref 200–950)

## 2017-07-17 LAB — THYROID PANEL WITH TSH
Free Thyroxine Index: 2.8 (ref 1.4–3.8)
T3 Uptake: 32 % (ref 22–35)
T4, Total: 8.7 ug/dL (ref 5.1–11.9)
TSH: 4.67 mIU/L — ABNORMAL HIGH

## 2017-07-17 MED ORDER — LEVOTHYROXINE SODIUM 125 MCG PO TABS: 125.0000 ug | ORAL_TABLET | Freq: Every day | ORAL | 1 refills | Status: DC

## 2017-07-17 MED ORDER — LOSARTAN POTASSIUM 25 MG PO TABS: 25.0000 mg | ORAL_TABLET | Freq: Every day | ORAL | 0 refills | Status: DC

## 2017-07-17 MED ORDER — ATORVASTATIN CALCIUM 40 MG PO TABS: 80.0000 mg | ORAL_TABLET | Freq: Every day | ORAL | 0 refills | Status: DC

## 2017-07-17 MED ORDER — CARVEDILOL 6.25 MG PO TABS: 6.2500 mg | ORAL_TABLET | Freq: Two times a day (BID) | ORAL | 0 refills | Status: DC

## 2017-07-17 MED ORDER — FUROSEMIDE 40 MG PO TABS
40.0000 mg | ORAL_TABLET | Freq: Every day | ORAL | 0 refills | Status: DC
Start: 2017-07-17 — End: 2017-07-21

## 2017-07-17 MED ORDER — POTASSIUM CHLORIDE CRYS ER 20 MEQ PO TBCR: 20.0000 meq | EXTENDED_RELEASE_TABLET | Freq: Every day | ORAL | 0 refills | Status: DC

## 2017-07-17 NOTE — Patient Instructions (Addendum)
I have refilled all of your medication. -Limit fluid intake to no more than 1500 ml per day. -If you experiencing greater than 3 lb weight gain within 24 hours, notify our clinic or if after hours report to the Emergency Department. -It is important to keep your blood pressure under control. Monitor blood pressure at home and if you obtain readings greater than 150/90 consecutively over a period of 3 days, notify me here at the office. -Avoid adding table salt or eating food such as can soup or frozen meals which are high in sodium. This increases fluid retention and is likely to worsen your heart failure.  -Read the educational information below thoroughly to aid you in management of your heart failure symptoms.      Heart Failure Heart failure means your heart has trouble pumping blood. This makes it hard for your body to work well. Heart failure is usually a long-term (chronic) condition. You must take good care of yourself and follow your doctor's treatment plan. Follow these instructions at home:  Take your heart medicine as told by your doctor. ? Do not stop taking medicine unless your doctor tells you to. ? Do not skip any dose of medicine. ? Refill your medicines before they run out. ? Take other medicines only as told by your doctor or pharmacist.  Stay active if told by your doctor. The elderly and people with severe heart failure should talk with a doctor about physical activity.  Eat heart-healthy foods. Choose foods that are without trans fat and are low in saturated fat, cholesterol, and salt (sodium). This includes fresh or frozen fruits and vegetables, fish, lean meats, fat-free or low-fat dairy foods, whole grains, and high-fiber foods. Lentils and dried peas and beans (legumes) are also good choices.  Limit salt if told by your doctor.  Cook in a healthy way. Roast, grill, broil, bake, poach, steam, or stir-fry foods.  Limit fluids as told by your doctor.  Weigh  yourself every morning. Do this after you pee (urinate) and before you eat breakfast. Write down your weight to give to your doctor.  Take your blood pressure and write it down if your doctor tells you to.  Ask your doctor how to check your pulse. Check your pulse as told.  Lose weight if told by your doctor.  Stop smoking or chewing tobacco. Do not use gum or patches that help you quit without your doctor's approval.  Schedule and go to doctor visits as told.  Nonpregnant women should have no more than 1 drink a day. Men should have no more than 2 drinks a day. Talk to your doctor about drinking alcohol.  Stop illegal drug use.  Stay current with shots (immunizations).  Manage your health conditions as told by your doctor.  Learn to manage your stress.  Rest when you are tired.  If it is really hot outside: ? Avoid intense activities. ? Use air conditioning or fans, or get in a cooler place. ? Avoid caffeine and alcohol. ? Wear loose-fitting, lightweight, and light-colored clothing.  If it is really cold outside: ? Avoid intense activities. ? Layer your clothing. ? Wear mittens or gloves, a hat, and a scarf when going outside. ? Avoid alcohol.  Learn about heart failure and get support as needed.  Get help to maintain or improve your quality of life and your ability to care for yourself as needed. Contact a doctor if:  You gain weight quickly.  You are more short  of breath than usual.  You cannot do your normal activities.  You tire easily.  You cough more than normal, especially with activity.  You have any or more puffiness (swelling) in areas such as your hands, feet, ankles, or belly (abdomen).  You cannot sleep because it is hard to breathe.  You feel like your heart is beating fast (palpitations).  You get dizzy or light-headed when you stand up. Get help right away if:  You have trouble breathing.  There is a change in mental status, such as  becoming less alert or not being able to focus.  You have chest pain or discomfort.  You faint. This information is not intended to replace advice given to you by your health care provider. Make sure you discuss any questions you have with your health care provider. Document Released: 07/15/2008 Document Revised: 03/13/2016 Document Reviewed: 11/22/2012 Elsevier Interactive Patient Education  2017 [employer].

## 2017-07-17 NOTE — Progress Notes (Signed)
Patient ID: Brittney Ross, female    DOB: 10/24/1960, 56 y.o.   MRN: [id number]  PCP: Scot Jun, FNP  Chief Complaint  Patient presents with  . Establish Care  . Hospitalization Follow-up    Subjective:  HPI Brittney Ross is a 56 y.o. female presents to establish care and for hospital follow-up.Medical problems include morbid obesity, hypertension, hyperlipidemia, systolic/diastolic heart failure, and postablative hypothyroidism. On 07/05/2017, Brittney Ross was admitted to [facility] with a complaint of acute onset of shortness of breath with cough, and orthopnea. She was treated with IV diuretic therapy, echo indicated reduced EF 20-25%, with wall motion abnormality. She underwent right and left heart cath without intervention although cath revealed stenosis of LAD with normal circumflex and RCA. Post cath recommendation is for medication management. She will follow-up with cardiology on 07/21/2017. [name] reports that she is not experiencing orthopnea, shortness of breath, or unintended weight gain. Brittney Ross suffers from hypothyroidism and is chronically prescribed Synthroid. Her TSH was abnormally elevated during hospitalization, although she reports compliance with Synthroid therapy. Current Body mass index is 47.93 kg/m. Social History   Social History  . Marital status: Married    Spouse name: N/A  . Number of children: N/A  . Years of education: N/A   Occupational History  . Not on file.   Social History Main Topics  . Smoking status: Never Smoker  . Smokeless tobacco: Never Used  . Alcohol use No  . Drug use: No  . Sexual activity: Yes    Birth control/ protection: Surgical   Other Topics Concern  . Not on file   Social History Narrative  . No narrative on file    Family History  Problem Relation Age of Onset  . Hypertension Father   . Heart disease Mother   . Hypertension Sister    Review of Systems  Constitutional: Negative.   HENT: Negative.   Respiratory:  Negative for cough, choking, chest tightness and shortness of breath.   Cardiovascular: Negative for chest pain, palpitations and leg swelling.  Endocrine: Negative.   Neurological: Negative for dizziness and headaches.  Hematological: Negative.   Psychiatric/Behavioral: Negative.    Patient Active Problem List   Diagnosis Date Noted  . Dilated cardiomyopathy ([place])   . Hypokalemia 07/05/2017  . Acute CHF (congestive heart failure) ([place]) 07/05/2017  . Acute pulmonary edema (HCC)   . Chronic female pelvic pain 08/14/2011  . H/O Graves' disease 03/20/2010  . 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 Class 3 obesity in adult 03/20/2008  . HYPERTENSION, BENIGN ESSENTIAL 03/20/2008  . HYPERCHOLESTEROLEMIA 02/22/2008  . MIGRAINE HEADACHE 02/21/2008  . KNEE PAIN, RIGHT 02/21/2008  . FIBROIDS, UTERUS 02/06/2008  . ANEMIA 02/06/2008    No Known Allergies  Prior to Admission medications   Medication Sig Start Date End Date Taking? Authorizing Provider  albuterol (PROVENTIL HFA;VENTOLIN HFA) 108 (90 Base) MCG/ACT inhaler Inhale 2 puffs into the lungs every 6 (six) hours as needed for wheezing or shortness of breath.   Yes [provider]  atorvastatin (LIPITOR) 40 MG tablet Take 2 tablets (80 mg total) by mouth daily at 6 (six) AM. 07/11/17  Yes Elodia Florence., MD  carvedilol (COREG) 6.25 MG tablet Take 1 tablet (6.25 mg total) by mouth 2 (two) times daily with a meal. 07/10/17 09/08/17 Yes Elodia Florence., MD  furosemide (LASIX) 40 MG tablet Take 1 tablet (40 mg total) by mouth daily. 07/11/17  Yes Elodia Florence., MD  levothyroxine (SYNTHROID, LEVOTHROID) 125 MCG tablet Take 1 tablet (125 mcg total) by mouth daily. 06/15/17  Yes Philemon Kingdom, MD  losartan (COZAAR) 25 MG tablet Take 1 tablet (25 mg total) by mouth daily. 07/11/17  Yes Elodia Florence., MD  potassium chloride SA (K-DUR,KLOR-CON) 20 MEQ tablet Take 1 tablet (20 mEq total) by mouth daily.  07/11/17  Yes Elodia Florence., MD    Past Medical, Surgical Family and Social History reviewed and updated.    Objective:   Today's Vitals   07/17/17 1029  BP: 117/60  Pulse: 65  Resp: 14  Temp: 98.3 F (36.8 C)  TempSrc: Oral  SpO2: 98%  Weight: 288 lb (130.6 kg)  Height: 5\' 5"  (1.651 m)    Wt Readings from Last 3 Encounters:  07/17/17 288 lb (130.6 kg)  07/10/17 290 lb 14.4 oz (132 kg)  06/24/17 295 lb (133.8 kg)   Physical Exam  Constitutional: She is oriented to person, place, and time. She appears well-developed and well-nourished.  HENT:  Head: Normocephalic and atraumatic.  Eyes: Pupils are equal, round, and reactive to light. Conjunctivae and EOM are normal.  Neck: Normal range of motion. Neck supple.  Cardiovascular: Normal rate, regular rhythm, normal heart sounds and intact distal pulses.   No murmur heard. Pulmonary/Chest: Effort normal and breath sounds normal. She has no wheezes. She exhibits no tenderness.  Musculoskeletal: Normal range of motion. She exhibits no edema.  Lymphadenopathy:    She has no cervical adenopathy.  Neurological: She is alert and oriented to person, place, and time.  Skin: Skin is warm and dry.  Psychiatric: She has a normal mood and affect. Her behavior is normal. Judgment and thought content normal.   Assessment & Plan:  1. Essential hypertension, Controlled, continue current regimen   2. Acute heart failure, unspecified heart failure type [facility]), asymptomatic today , continue follow-up with cardiology and current medication therapy.  3. Other hyperlipidemia, not at goal, continue statin and aspirin therapy. Physical activity encouraged as tolerated. Dietary education provided recommended reduction in foods high in saturated fat , baked foods instead of fried.  The 10-year ASCVD risk score Brittney Ross DC Brittney Ross., et al., 2013) is: 5.8%   Values used to calculate the score:     Age: 86 years     Sex: Female     Is Non-Hispanic  African American: No     Diabetic: No     Tobacco smoker: No     Systolic Blood Pressure: 841 mmHg     Is BP treated: Yes     HDL Cholesterol: 29 mg/dL     Total Cholesterol: 228 mg/dL   4. Postablative hypothyroidism, rechecking today, elevated during recent hospitalization, likely elevated due to stress of recent heart failure.  For now continue Synthroid at current dose.   Meds ordered this encounter  Medications  . potassium chloride SA (K-DUR,KLOR-CON) 20 MEQ tablet    Sig: Take 1 tablet (20 mEq total) by mouth daily.    Dispense:  30 tablet    Refill:  0    Order Specific Question:   Supervising Provider    Answer:   Tresa Garter [id number]  . DISCONTD: losartan (COZAAR) 25 MG tablet    Sig: Take 1 tablet (25 mg total) by mouth daily.    Dispense:  30 tablet    Refill:  0    Order Specific Question:   Supervising Provider    Answer:   Tresa Garter [  [id number]]  . levothyroxine (SYNTHROID, LEVOTHROID) 125 MCG tablet    Sig: Take 1 tablet (125 mcg total) by mouth daily.    Dispense:  90 tablet    Refill:  1    Order Specific Question:   Supervising Provider    Answer:   Tresa Garter [id number]  . DISCONTD: furosemide (LASIX) 40 MG tablet    Sig: Take 1 tablet (40 mg total) by mouth daily.    Dispense:  30 tablet    Refill:  0    Order Specific Question:   Supervising Provider    Answer:   Tresa Garter [id number]  . carvedilol (COREG) 6.25 MG tablet    Sig: Take 1 tablet (6.25 mg total) by mouth 2 (two) times daily with a meal.    Dispense:  60 tablet    Refill:  0    Order Specific Question:   Supervising Provider    Answer:   Tresa Garter [id number]  . atorvastatin (LIPITOR) 40 MG tablet    Sig: Take 2 tablets (80 mg total) by mouth daily at 6 (six) AM.    Dispense:  30 tablet    Refill:  0    Order Specific Question:   Supervising Provider    Answer:   Tresa Garter [[id number]]     Orders Placed This Encounter   Procedures  . Thyroid Panel With TSH  . COMPLETE METABOLIC PANEL WITH GFR  . CBC with Differential  . POCT urinalysis dip (device)    RTC: 6 weeks for chronic disease management and overdue health maintenance items.   Carroll Sage. Kenton Kingfisher, MSN, FNP-C [facility]  [address] Barbara Cower [place], [place] [address] [phone]

## 2017-07-20 NOTE — Progress Notes (Signed)
Cardiology Office Note:    Date:  07/21/2017   ID:  Brittney Ross, DOB 1961-07-13, MRN [id number]  PCP:  Scot Jun, FNP  Cardiologist:  Dr. Liam Rogers    Referring MD: No ref. provider found   Chief Complaint  Patient presents with  . Hospitalization Follow-up    Admitted for congestive heart failure    History of Present Illness:    Brittney Ross is a 56 y.o. female with a hx of HTN, HL, anemia, obesity.  She was admitted 9/16-9/21 with acute systolic congestive heart failure. An echocardiogram demonstrated EF 20-25.  A Cardiac Catheterization demonstrated mild non-obstructive CAD in the Diagonal.  It was felt that this was a NICM.  Medications were adjusted for congestive heart failure.    Brittney Ross returns for posthospitalization follow-up. She is here alone. Since discharge, she has been doing well. Her breathing is improved. She denies having any chest discomfort. She denies lower extremity edema, paroxysmal nocturnal dyspnea or syncope.  Prior CV studies:   The following studies were reviewed today:  R/L Cardiac Catheterization 07/09/17 LM normal LAD ok, D1 prox 40, mid 30 LCx normal RCA normal Mean RA 10, mean PAP 29, mean PCWP 24 LVEDP 29  Echocardiogram 07/06/17 EF 20-25, severe diff HK, Gr 1 DD, mild MR, mod LAE  Past Medical History:  Diagnosis Date  . Anemia   . Chronic combined systolic and diastolic heart failure (HCC) 07/05/2017   Non-ischemic CM // Echo 9/18:EF 20-25, severe diff HK, Gr 1 DD, mild MR, mod LAE // R/L Heart Cath 9/18: pD1 40, mD1 30; o/w normal cors  . Hyperlipidemia   . Hypertension    Pt states she "doesn't have HTN"  . Morbid obesity ([place])   . Thyroid disease hyperthyroidism    Past Surgical History:  Procedure Laterality Date  . CESAREAN SECTION  12/07/79  . CESAREAN SECTION  05/17/81  . CESAREAN SECTION  09/06/89  . RATH/BSO/cystoscopy    . RIGHT/LEFT HEART CATH AND CORONARY ANGIOGRAPHY N/A 07/09/2017   Procedure:  RIGHT/LEFT HEART CATH AND CORONARY ANGIOGRAPHY;  Surgeon: Troy Sine, MD;  Location: [place] [facility];  Service: Cardiovascular;  Laterality: N/A;  . TUBAL LIGATION      Current Medications: Current Meds  Medication Sig  . albuterol (PROVENTIL HFA;VENTOLIN HFA) 108 (90 Base) MCG/ACT inhaler Inhale 2 puffs into the lungs every 6 (six) hours as needed for wheezing or shortness of breath.  Marland Kitchen aspirin EC 81 MG tablet Take 81 mg by mouth daily.  Marland Kitchen atorvastatin (LIPITOR) 40 MG tablet Take 2 tablets (80 mg total) by mouth daily at 6 (six) AM.  . carvedilol (COREG) 6.25 MG tablet Take 1 tablet (6.25 mg total) by mouth 2 (two) times daily with a meal.  . levothyroxine (SYNTHROID, LEVOTHROID) 125 MCG tablet Take 1 tablet (125 mcg total) by mouth daily.  . potassium chloride SA (K-DUR,KLOR-CON) 20 MEQ tablet Take 1 tablet (20 mEq total) by mouth daily.  . [DISCONTINUED] furosemide (LASIX) 40 MG tablet Take 1 tablet (40 mg total) by mouth daily.  . [DISCONTINUED] losartan (COZAAR) 25 MG tablet Take 1 tablet (25 mg total) by mouth daily.     Allergies:   Patient has no known allergies.   Social History   Social History  . Marital status: Married    Spouse name: N/A  . Number of children: N/A  . Years of education: N/A   Social History Main Topics  . Smoking status:  Never Smoker  . Smokeless tobacco: Never Used  . Alcohol use No  . Drug use: No  . Sexual activity: Yes    Birth control/ protection: Surgical   Other Topics Concern  . None   Social History Narrative  . None     Family Hx: The patient's family history includes Heart disease in her mother; Hypertension in her father and sister.  ROS:   Please see the history of present illness.    ROS All other systems reviewed and are negative.   EKGs/Labs/Other Test Reviewed:    EKG:  EKG is  ordered today.  The ekg ordered today demonstrates NSR, HR 65, normal axis, QTC 465 ms, similar to prior tracing  Recent  Labs: 07/05/2017: B Natriuretic Peptide 953.6 07/07/2017: Magnesium 2.0 07/17/2017: ALT 18; BUN 22; Creat 0.82; Hemoglobin 12.0; Platelets 185; Potassium 4.3; Sodium 143; TSH 4.67   Recent Lipid Panel Lab Results  Component Value Date/Time   CHOL 228 (H) 07/10/2017 04:19 AM   TRIG 92 07/10/2017 04:19 AM   HDL 29 (L) 07/10/2017 04:19 AM   CHOLHDL 7.9 07/10/2017 04:19 AM   LDLCALC 181 (H) 07/10/2017 04:19 AM    Physical Exam:    VS:  BP 130/60   Pulse 65   Ht 5\' 5"  (1.651 m)   Wt 288 lb 12.8 oz (131 kg)   LMP 10/28/2011   BMI 48.06 kg/m     Wt Readings from Last 3 Encounters:  07/21/17 288 lb 12.8 oz (131 kg)  07/17/17 288 lb (130.6 kg)  07/10/17 290 lb 14.4 oz (132 kg)     Physical Exam  Constitutional: She is oriented to person, place, and time. She appears well-developed and well-nourished.  HENT:  Head: Normocephalic and atraumatic.  Neck: No JVD present.  Cardiovascular: Normal rate and regular rhythm.   No murmur heard. Pulmonary/Chest: Effort normal. She has no rales.  Abdominal: Soft.  Musculoskeletal: She exhibits no deformity (right wrist without hematoma).  Neurological: She is alert and oriented to person, place, and time.  Skin: Skin is warm and dry.  Psychiatric: She has a normal mood and affect.    ASSESSMENT:    1. Chronic combined systolic and diastolic heart failure ([place])   2. Dilated cardiomyopathy ([place])   3. HYPERTENSION, BENIGN ESSENTIAL   4. Hyperlipidemia, unspecified hyperlipidemia type   5. Morbid obesity ([place])    PLAN:    In order of problems listed above:  1. Chronic combined systolic and diastolic heart failure (HCC)  New York Heart Association class II. Her volume is stable. Continue current dose of beta blocker. She would benefit from the addition of angiotensin receptor neprilysin inhibitor (Entresto).    -  Decrease Lasix to 20 mg daily  -  Decrease potassium to 10 mEq daily   -  DC losartan  -  Start Entresto 24/26 mg twice a  day  -  BMET 2 weeks  -  No to return to work given today.  2. Dilated cardiomyopathy (HCC) Nonischemic cardiomyopathy. Continue titration of heart failure medications. Plan follow-up echocardiogram in 3-6 months to reassess LV function. If his ejection fraction remains less than 35%, referred to EP for consideration of ICD.   3. HYPERTENSION, BENIGN ESSENTIAL   Blood pressure is well controlled.   4. Hyperlipidemia, unspecified hyperlipidemia type Hyperlipidemia is managed by primary care. As she did have mild coronary plaque on cardiac catheterization, continue statin therapy and aspirin.   5. Morbid obesity ([place]) BMI greater  than 45. She was trying to work on weight loss before she got sick. We briefly discussed increasing activity.  Dispo:  Return in about 4 weeks (around 08/18/2017) for Routine Follow Up, w/ Richardson Dopp, PA-C.   Medication Adjustments/Labs and Tests Ordered: Current medicines are reviewed at length with the patient today.  Concerns regarding medicines are outlined above.  Tests Ordered: Orders Placed This Encounter  Procedures  . Basic Metabolic Panel (BMET)  . EKG 12-Lead   Medication Changes: Meds ordered this encounter  Medications  . sacubitril-valsartan (ENTRESTO) 24-26 MG    Sig: Take 1 tablet by mouth 2 (two) times daily.    Dispense:  180 tablet    Refill:  3    CHANGE IN THERAPY; D/C LOSARTAN  . furosemide (LASIX) 20 MG tablet    Sig: Take 1 tablet (20 mg total) by mouth daily.    Dispense:  90 tablet    Refill:  3    DECREASE IN DOSE  . potassium chloride (K-DUR) 10 MEQ tablet    Sig: Take 1 tablet (10 mEq total) by mouth daily.    Dispense:  90 tablet    Refill:  3    DECREASE IN DOSE    Signed, Richardson Dopp, PA-C  07/21/2017 9:30 AM    [facility] [place], [place], [place]  [address] Phone: [phone]; Fax: [phone]

## 2017-07-21 ENCOUNTER — Ambulatory Visit (INDEPENDENT_AMBULATORY_CARE_PROVIDER_SITE_OTHER): Payer: BLUE CROSS/BLUE SHIELD | Admitting: Physician Assistant

## 2017-07-21 ENCOUNTER — Encounter: Payer: Self-pay | Admitting: Physician Assistant

## 2017-07-21 VITALS — BP 130/60 | HR 65 | Ht 65.0 in | Wt 288.8 lb

## 2017-07-21 DIAGNOSIS — I5042 Chronic combined systolic (congestive) and diastolic (congestive) heart failure: Secondary | ICD-10-CM

## 2017-07-21 DIAGNOSIS — I10 Essential (primary) hypertension: Secondary | ICD-10-CM

## 2017-07-21 DIAGNOSIS — E785 Hyperlipidemia, unspecified: Secondary | ICD-10-CM

## 2017-07-21 DIAGNOSIS — I420 Dilated cardiomyopathy: Secondary | ICD-10-CM | POA: Diagnosis not present

## 2017-07-21 MED ORDER — FUROSEMIDE 20 MG PO TABS: 20.0000 mg | ORAL_TABLET | Freq: Every day | ORAL | 3 refills | Status: DC

## 2017-07-21 MED ORDER — SACUBITRIL-VALSARTAN 24-26 MG PO TABS: 1.0000 | ORAL_TABLET | Freq: Two times a day (BID) | ORAL | 3 refills | Status: DC

## 2017-07-21 MED ORDER — POTASSIUM CHLORIDE ER 10 MEQ PO TBCR: 10.0000 meq | EXTENDED_RELEASE_TABLET | Freq: Every day | ORAL | 3 refills | Status: DC

## 2017-07-21 NOTE — Patient Instructions (Signed)
Medication Instructions:  1. STOP LOSARTAN   2. START ENTRESTO 24/26 MG 1 TABLET TWICE DAILY; RX HAS BEEN SENT IN AND YOU HAVE BEEN GIVEN SAMPLES  3. DECREASE LASIX TO 20 MG DAILY; NEW RX HAS BEEN SENT IN N  4. DECREASE POTASSIUM TO 10 MEQ DAILY; NEW RX HAS BEEN SENT IN N   Labwork: IN 2 WEEK BMET DUE TO MEDICATION CHANGES  Testing/Procedures: NONE ORDERED TODAY  Follow-Up: 1. SCOTT WEAVER,PAC 3-4 WEEKS  2. DR. Acie Fredrickson IN 3 MONTHS   Any Other Special Instructions Will Be Listed Below (If Applicable).  YOU HAVE BEEN GIVEN A WORK NOTE TODAY   If you need a refill on your cardiac medications before your next appointment, please call your pharmacy.

## 2017-07-30 ENCOUNTER — Telehealth: Payer: Self-pay | Admitting: *Deleted

## 2017-07-30 NOTE — Telephone Encounter (Signed)
Left message to reschedule appt with Richardson Dopp, PA. PA will be out of the office early morning on 08/11/17, pt is current ly scheduled for 10/23 @ 9:45. There is not any further availability left on 10/23 schedule for [place], [place]. Will need to change to a different day.

## 2017-08-04 ENCOUNTER — Other Ambulatory Visit: Payer: BLUE CROSS/BLUE SHIELD | Admitting: *Deleted

## 2017-08-04 DIAGNOSIS — I5042 Chronic combined systolic (congestive) and diastolic (congestive) heart failure: Secondary | ICD-10-CM

## 2017-08-04 LAB — BASIC METABOLIC PANEL
BUN/Creatinine Ratio: 20 (ref 9–23)
BUN: 15 mg/dL (ref 6–24)
CALCIUM: 9.4 mg/dL (ref 8.7–10.2)
CO2: 25 mmol/L (ref 20–29)
Chloride: 105 mmol/L (ref 96–106)
Creatinine, Ser: 0.74 mg/dL (ref 0.57–1.00)
GFR, EST AFRICAN AMERICAN: 105 mL/min/{1.73_m2} (ref >59)
GFR, EST NON AFRICAN AMERICAN: 91 mL/min/{1.73_m2} (ref >59)
Glucose: 92 mg/dL (ref 65–99)
POTASSIUM: 3.8 mmol/L (ref 3.5–5.2)
Sodium: 143 mmol/L (ref 134–144)

## 2017-08-06 ENCOUNTER — Other Ambulatory Visit: Payer: Self-pay | Admitting: Family Medicine

## 2017-08-11 ENCOUNTER — Ambulatory Visit: Payer: BLUE CROSS/BLUE SHIELD | Admitting: Physician Assistant

## 2017-08-17 NOTE — Progress Notes (Signed)
Cardiology Office Note:    Date:  08/18/2017   ID:  Brittney Ross, DOB 1961/03/02, MRN [id number]  PCP:  Brittney Jun, FNP  Cardiologist:  Dr. Liam Ross    Referring MD: Brittney Jun, FNP   Chief Complaint  Patient presents with  . Congestive Heart Failure    For follow-up    History of Present Illness:    Brittney Ross is a 56 y.o. female with a hx of systolic congestive heart failure, hypertension, hyperlipidemia, anemia, obesity.   She was admitted 06/2016 with acute systolic congestive heart failure. An echocardiogram demonstrated EF 20-25.  A Cardiac Catheterization demonstrated mild non-obstructive CAD in the Diagonal.  It was felt that this was a NICM.    Last seen 07/21/17.  [name] returns for follow-up on heart failure.  She is here alone.  Since last seen, she has been doing well.  She has lost 2 more pounds.  She denies significant shortness of breath, she denies chest discomfort.  She denies paroxysmal nocturnal dyspnea or lower extreme edema.  She denies syncope.  Prior CV studies:   The following studies were reviewed today:  R/L Cardiac Catheterization 07/09/17 LM normal LAD ok, D1 prox 40, mid 30 LCx normal RCA normal Mean RA 10, mean PAP 29, mean PCWP 24 LVEDP 29  Echocardiogram 07/06/17 EF 20-25, severe diff HK, Gr 1 DD, mild MR, mod LAE  Past Medical History:  Diagnosis Date  . Anemia   . Chronic combined systolic and diastolic heart failure (HCC) 07/05/2017   Non-ischemic CM // Echo 9/18:EF 20-25, severe diff HK, Gr 1 DD, mild MR, mod LAE // R/L Heart Cath 9/18: pD1 40, mD1 30; o/w normal cors  . Hyperlipidemia   . Hypertension    Pt states she "doesn't have HTN"  . Morbid obesity (Brittney Ross)   . Thyroid disease hyperthyroidism    Past Surgical History:  Procedure Laterality Date  . CESAREAN SECTION  12/07/79  . CESAREAN SECTION  05/17/81  . CESAREAN SECTION  09/06/89  . RATH/BSO/cystoscopy    . RIGHT/LEFT HEART CATH AND CORONARY  ANGIOGRAPHY N/A 07/09/2017   Procedure: RIGHT/LEFT HEART CATH AND CORONARY ANGIOGRAPHY;  Surgeon: Brittney Sine, MD;  Location: [place] [facility];  Service: Cardiovascular;  Laterality: N/A;  . TUBAL LIGATION      Current Medications: Current Meds  Medication Sig  . albuterol (PROVENTIL HFA;VENTOLIN HFA) 108 (90 Base) MCG/ACT inhaler Inhale 2 puffs into the lungs every 6 (six) hours as needed for wheezing or shortness of breath.  Marland Kitchen aspirin EC 81 MG tablet Take 81 mg by mouth daily.  Marland Kitchen atorvastatin (LIPITOR) 40 MG tablet TAKE 2 TABLETS BY MOUTH ONCE DAILY AT  6AM  . carvedilol (COREG) 6.25 MG tablet Take 1 tablet (6.25 mg total) by mouth 2 (two) times daily with a meal.  . levothyroxine (SYNTHROID, LEVOTHROID) 125 MCG tablet Take 1 tablet (125 mcg total) by mouth daily.  . sacubitril-valsartan (ENTRESTO) 24-26 MG Take 1 tablet by mouth 2 (two) times daily.  . [DISCONTINUED] furosemide (LASIX) 20 MG tablet Take 1 tablet (20 mg total) by mouth daily.  . [DISCONTINUED] potassium chloride (K-DUR) 10 MEQ tablet Take 1 tablet (10 mEq total) by mouth daily.     Allergies:   Patient has no known allergies.   Social History  Substance Use Topics  . Smoking status: Never Smoker  . Smokeless tobacco: Never Used  . Alcohol use No     Family  Hx: The patient's family history includes Heart disease in her mother; Hypertension in her father and sister.  ROS:   Please see the history of present illness.    ROS All other systems reviewed and are negative.   EKGs/Labs/Other Test Reviewed:    EKG:  EKG is not ordered today.    Recent Labs: 07/05/2017: B Natriuretic Peptide 953.6 07/07/2017: Magnesium 2.0 07/17/2017: ALT 18; Hemoglobin 12.0; Platelets 185; TSH 4.67 08/04/2017: BUN 15; Creatinine, Ser 0.74; Potassium 3.8; Sodium 143   Recent Lipid Panel Lab Results  Component Value Date/Time   CHOL 228 (H) 07/10/2017 04:19 AM   TRIG 92 07/10/2017 04:19 AM   HDL 29 (L) 07/10/2017 04:19 AM    CHOLHDL 7.9 07/10/2017 04:19 AM   LDLCALC 181 (H) 07/10/2017 04:19 AM    Physical Exam:    VS:  BP 116/80   Pulse 60   Ht 5\' 6"  (1.676 m)   Wt 286 lb 6.4 oz (129.9 kg)   LMP 10/28/2011   BMI 46.23 kg/m     Wt Readings from Last 3 Encounters:  08/18/17 286 lb 6.4 oz (129.9 kg)  07/21/17 288 lb 12.8 oz (131 kg)  07/17/17 288 lb (130.6 kg)     Physical Exam  Constitutional: She is oriented to person, place, and time. She appears well-developed and well-nourished. No distress.  HENT:  Head: Normocephalic and atraumatic.  Neck: No JVD present.  Cardiovascular: Normal rate and regular rhythm.   No murmur heard. Pulmonary/Chest: Effort normal. She has no rales.  Abdominal: Soft.  Musculoskeletal: She exhibits no edema.  Neurological: She is alert and oriented to person, place, and time.  Skin: Skin is warm and dry.  Psychiatric: She has a normal mood and affect.    ASSESSMENT:    1. Chronic combined systolic and diastolic heart failure ([place])   2. Hypertensive heart disease with heart failure ([place])   3. HYPERCHOLESTEROLEMIA    PLAN:    In order of problems listed above:  1. Chronic combined systolic and diastolic heart failure (HCC)  Nonischemic cardiomyopathy.  New York Heart Association class II.  Volume is stable.  She is tolerating Entresto.   -Continue current dose of Entresto, carvedilol.  -DC potassium  -Change Lasix to as needed  -Start spironolactone 12.5 mg daily  -BMET weekly x2  -Follow-up echocardiogram in December prior to seeing Dr. Acie Ross  2. Hypertensive heart disease with heart failure ([place]) The patient's blood pressure is controlled on her current regimen.  Continue current therapy.   3. HYPERCHOLESTEROLEMIA Continue statin.   Dispo:  Return in 7 weeks (on 10/08/2017) for Routine Follow Up, w/ Dr. Acie Ross after Echo.   Medication Adjustments/Labs and Tests Ordered: Current medicines are reviewed at length with the patient today.  Concerns  regarding medicines are outlined above.  Tests Ordered: Orders Placed This Encounter  Procedures  . Basic metabolic panel  . Basic metabolic panel  . ECHOCARDIOGRAM COMPLETE   Medication Changes: Meds ordered this encounter  Medications  . furosemide (LASIX) 20 MG tablet    Sig: Take 1 tablet (20 mg total) by mouth daily as needed for fluid or edema.    Dispense:  90 tablet    Refill:  3    Order Specific Question:   Supervising Provider    Answer:   Jenkins Rouge C [[id number]]  . spironolactone (ALDACTONE) 25 MG tablet    Sig: Take 0.5 tablets (12.5 mg total) by mouth daily.    Dispense:  15  tablet    Refill:  11    Order Specific Question:   Supervising Provider    Answer:   Josue Hector [[id number]]    Signed, Richardson Dopp, PA-C  08/18/2017 9:49 AM    [facility] [place], [place], [place]  [address] Phone: [phone]; Fax: [phone]

## 2017-08-18 ENCOUNTER — Encounter (INDEPENDENT_AMBULATORY_CARE_PROVIDER_SITE_OTHER): Payer: Self-pay

## 2017-08-18 ENCOUNTER — Encounter: Payer: Self-pay | Admitting: Physician Assistant

## 2017-08-18 ENCOUNTER — Ambulatory Visit (INDEPENDENT_AMBULATORY_CARE_PROVIDER_SITE_OTHER): Payer: BLUE CROSS/BLUE SHIELD | Admitting: Physician Assistant

## 2017-08-18 VITALS — BP 116/80 | HR 60 | Ht 66.0 in | Wt 286.4 lb

## 2017-08-18 DIAGNOSIS — E7800 Pure hypercholesterolemia, unspecified: Secondary | ICD-10-CM | POA: Diagnosis not present

## 2017-08-18 DIAGNOSIS — I5042 Chronic combined systolic (congestive) and diastolic (congestive) heart failure: Secondary | ICD-10-CM | POA: Diagnosis not present

## 2017-08-18 DIAGNOSIS — I110 Hypertensive heart disease with heart failure: Secondary | ICD-10-CM | POA: Diagnosis not present

## 2017-08-18 MED ORDER — SPIRONOLACTONE 25 MG PO TABS: 12.5000 mg | ORAL_TABLET | Freq: Every day | ORAL | 11 refills | Status: DC

## 2017-08-18 MED ORDER — FUROSEMIDE 20 MG PO TABS: 20.0000 mg | ORAL_TABLET | Freq: Every day | ORAL | 3 refills | Status: AC | PRN

## 2017-08-18 NOTE — Patient Instructions (Signed)
Medication Instructions:  Stop taking potassium  Change Lasix to 20 mg Once daily as needed (if weight increases 3 lbs in 1 day or 5 lbs in 1 week, or you have leg swelling or worsening shortness of breath). Start Spironolactone 25 mg take 1/2 tab (12.5 mg) Once daily   Labwork: In 1 week - BMET In 2 weeks - BMET  Testing/Procedures: Schedule an echocardiogram the week of Dec 11-14, 2018  Follow-Up: Dr. Liam Rogers 10/08/2017 as planned.   Any Other Special Instructions Will Be Listed Below (If Applicable).  If you need a refill on your cardiac medications before your next appointment, please call your pharmacy.

## 2017-08-23 ENCOUNTER — Other Ambulatory Visit: Payer: Self-pay | Admitting: Family Medicine

## 2017-08-25 ENCOUNTER — Telehealth: Payer: Self-pay

## 2017-08-25 ENCOUNTER — Other Ambulatory Visit: Payer: BLUE CROSS/BLUE SHIELD

## 2017-08-25 ENCOUNTER — Other Ambulatory Visit: Payer: BLUE CROSS/BLUE SHIELD | Admitting: *Deleted

## 2017-08-25 DIAGNOSIS — I5042 Chronic combined systolic (congestive) and diastolic (congestive) heart failure: Secondary | ICD-10-CM

## 2017-08-25 LAB — BASIC METABOLIC PANEL
BUN / CREAT RATIO: 19 (ref 9–23)
BUN: 17 mg/dL (ref 6–24)
CHLORIDE: 101 mmol/L (ref 96–106)
CO2: 23 mmol/L (ref 20–29)
CREATININE: 0.91 mg/dL (ref 0.57–1.00)
Calcium: 9.1 mg/dL (ref 8.7–10.2)
GFR calc non Af Amer: 71 mL/min/{1.73_m2} (ref >59)
GFR, EST AFRICAN AMERICAN: 82 mL/min/{1.73_m2} (ref >59)
GLUCOSE: 102 mg/dL — AB (ref 65–99)
Potassium: 3.8 mmol/L (ref 3.5–5.2)
Sodium: 141 mmol/L (ref 134–144)

## 2017-08-25 MED ORDER — ALBUTEROL SULFATE HFA 108 (90 BASE) MCG/ACT IN AERS: 2.0000 | INHALATION_SPRAY | Freq: Four times a day (QID) | RESPIRATORY_TRACT | 2 refills | Status: DC | PRN

## 2017-08-25 MED ORDER — ATORVASTATIN CALCIUM 40 MG PO TABS: ORAL_TABLET | ORAL | 2 refills | Status: DC

## 2017-08-25 NOTE — Telephone Encounter (Signed)
Medication refilled

## 2017-09-01 ENCOUNTER — Other Ambulatory Visit: Payer: BLUE CROSS/BLUE SHIELD

## 2017-09-01 ENCOUNTER — Telehealth: Payer: Self-pay | Admitting: *Deleted

## 2017-09-01 DIAGNOSIS — I5042 Chronic combined systolic (congestive) and diastolic (congestive) heart failure: Secondary | ICD-10-CM

## 2017-09-01 LAB — BASIC METABOLIC PANEL
BUN/Creatinine Ratio: 19 (ref 9–23)
BUN: 17 mg/dL (ref 6–24)
CALCIUM: 9.3 mg/dL (ref 8.7–10.2)
CO2: 22 mmol/L (ref 20–29)
CREATININE: 0.89 mg/dL (ref 0.57–1.00)
Chloride: 105 mmol/L (ref 96–106)
GFR, EST AFRICAN AMERICAN: 84 mL/min/{1.73_m2} (ref >59)
GFR, EST NON AFRICAN AMERICAN: 73 mL/min/{1.73_m2} (ref >59)
Glucose: 100 mg/dL — ABNORMAL HIGH (ref 65–99)
POTASSIUM: 4 mmol/L (ref 3.5–5.2)
SODIUM: 142 mmol/L (ref 134–144)

## 2017-09-01 NOTE — Telephone Encounter (Signed)
-----   Message from Burtis Junes, NP sent at 09/01/2017  4:09 PM EST ----- Reviewed for Richardson Dopp, PA Ok to report. Labs are stable - would continue on current regimen as outlined by Nicki Reaper. Recheck BMET at next [place] please.

## 2017-09-06 ENCOUNTER — Other Ambulatory Visit: Payer: Self-pay | Admitting: Family Medicine

## 2017-09-08 MED ORDER — ATORVASTATIN CALCIUM 40 MG PO TABS: ORAL_TABLET | ORAL | 2 refills | Status: DC

## 2017-09-08 MED ORDER — CARVEDILOL 6.25 MG PO TABS: ORAL_TABLET | ORAL | 2 refills | Status: DC

## 2017-09-08 NOTE — Addendum Note (Signed)
Addended by: Scot Jun on: 09/08/2017 05:42 PM   Modules accepted: Orders

## 2017-09-08 NOTE — Telephone Encounter (Signed)
Coreg and Lipitor refill

## 2017-09-14 ENCOUNTER — Ambulatory Visit: Payer: BLUE CROSS/BLUE SHIELD | Admitting: Family Medicine

## 2017-09-14 ENCOUNTER — Encounter: Payer: Self-pay | Admitting: Family Medicine

## 2017-09-14 VITALS — BP 110/64 | HR 68 | Temp 98.7°F | Resp 14 | Ht 66.0 in | Wt 292.0 lb

## 2017-09-14 DIAGNOSIS — I5042 Chronic combined systolic (congestive) and diastolic (congestive) heart failure: Secondary | ICD-10-CM | POA: Diagnosis not present

## 2017-09-14 DIAGNOSIS — I10 Essential (primary) hypertension: Secondary | ICD-10-CM

## 2017-09-14 DIAGNOSIS — E039 Hypothyroidism, unspecified: Secondary | ICD-10-CM

## 2017-09-14 LAB — POCT URINALYSIS DIP (DEVICE)
BILIRUBIN URINE: NEGATIVE
Glucose, UA: NEGATIVE mg/dL
HGB URINE DIPSTICK: NEGATIVE
Ketones, ur: NEGATIVE mg/dL
Leukocytes, UA: NEGATIVE
NITRITE: NEGATIVE
PH: 5.5 (ref 5.0–8.0)
PROTEIN: NEGATIVE mg/dL
Specific Gravity, Urine: >=1.03 (ref 1.005–1.030)
Urobilinogen, UA: 0.2 mg/dL (ref 0.0–1.0)

## 2017-09-14 NOTE — Patient Instructions (Signed)
Calorie Counting for Weight Loss Calories are units of energy. Your body needs a certain amount of calories from food to keep you going throughout the day. When you eat more calories than your body needs, your body stores the extra calories as fat. When you eat fewer calories than your body needs, your body burns fat to get the energy it needs. Calorie counting means keeping track of how many calories you eat and drink each day. Calorie counting can be helpful if you need to lose weight. If you make sure to eat fewer calories than your body needs, you should lose weight. Ask your health care provider what a healthy weight is for you. For calorie counting to work, you will need to eat the right number of calories in a day in order to lose a healthy amount of weight per week. A dietitian can help you determine how many calories you need in a day and will give you suggestions on how to reach your calorie goal.  A healthy amount of weight to lose per week is usually 1-2 lb (0.5-0.9 kg). This usually means that your daily calorie intake should be reduced by 500-750 calories.  Eating 1,200 - 1,500 calories per day can help most women lose weight.  Eating 1,500 - 1,800 calories per day can help most men lose weight.  What is my plan? My goal is to have __________ calories per day. If I have this many calories per day, I should lose around __________ pounds per week. What do I need to know about calorie counting? In order to meet your daily calorie goal, you will need to:  Find out how many calories are in each food you would like to eat. Try to do this before you eat.  Decide how much of the food you plan to eat.  Write down what you ate and how many calories it had. Doing this is called keeping a food log.  To successfully lose weight, it is important to balance calorie counting with a healthy lifestyle that includes regular activity. Aim for 150 minutes of moderate exercise (such as walking) or  75 minutes of vigorous exercise (such as running) each week. Where do I find calorie information?  The number of calories in a food can be found on a Nutrition Facts label. If a food does not have a Nutrition Facts label, try to look up the calories online or ask your dietitian for help. Remember that calories are listed per serving. If you choose to have more than one serving of a food, you will have to multiply the calories per serving by the amount of servings you plan to eat. For example, the label on a package of bread might say that a serving size is 1 slice and that there are 90 calories in a serving. If you eat 1 slice, you will have eaten 90 calories. If you eat 2 slices, you will have eaten 180 calories. How do I keep a food log? Immediately after each meal, record the following information in your food log:  What you ate. Don't forget to include toppings, sauces, and other extras on the food.  How much you ate. This can be measured in cups, ounces, or number of items.  How many calories each food and drink had.  The total number of calories in the meal.  Keep your food log near you, such as in a small notebook in your pocket, or use a mobile app or website. Some  programs will calculate calories for you and show you how many calories you have left for the day to meet your goal. What are some calorie counting tips?  Use your calories on foods and drinks that will fill you up and not leave you hungry: ? Some examples of foods that fill you up are nuts and nut butters, vegetables, lean proteins, and high-fiber foods like whole grains. High-fiber foods are foods with more than 5 g fiber per serving. ? Drinks such as sodas, specialty coffee drinks, alcohol, and juices have a lot of calories, yet do not fill you up.  Eat nutritious foods and avoid empty calories. Empty calories are calories you get from foods or beverages that do not have many vitamins or protein, such as candy, sweets,  and soda. It is better to have a nutritious high-calorie food (such as an avocado) than a food with few nutrients (such as a bag of chips).  Know how many calories are in the foods you eat most often. This will help you calculate calorie counts faster.  Pay attention to calories in drinks. Low-calorie drinks include water and unsweetened drinks.  Pay attention to nutrition labels for "low fat" or "fat free" foods. These foods sometimes have the same amount of calories or more calories than the full fat versions. They also often have added sugar, starch, or salt, to make up for flavor that was removed with the fat.  Find a way of tracking calories that works for you. Get creative. Try different apps or programs if writing down calories does not work for you. What are some portion control tips?  Know how many calories are in a serving. This will help you know how many servings of a certain food you can have.  Use a measuring cup to measure serving sizes. You could also try weighing out portions on a kitchen scale. With time, you will be able to estimate serving sizes for some foods.  Take some time to put servings of different foods on your favorite plates, bowls, and cups so you know what a serving looks like.  Try not to eat straight from a bag or box. Doing this can lead to overeating. Put the amount you would like to eat in a cup or on a plate to make sure you are eating the right portion.  Use smaller plates, glasses, and bowls to prevent overeating.  Try not to multitask (for example, watch TV or use your computer) while eating. If it is time to eat, sit down at a table and enjoy your food. This will help you to know when you are full. It will also help you to be aware of what you are eating and how much you are eating. What are tips for following this plan? Reading food labels  Check the calorie count compared to the serving size. The serving size may be smaller than what you are used  to eating.  Check the source of the calories. Make sure the food you are eating is high in vitamins and protein and low in saturated and trans fats. Shopping  Read nutrition labels while you shop. This will help you make healthy decisions before you decide to purchase your food.  Make a grocery list and stick to it. Cooking  Try to cook your favorite foods in a healthier way. For example, try baking instead of frying.  Use low-fat dairy products. Meal planning  Use more fruits and vegetables. Half of your plate should  be fruits and vegetables.  Include lean proteins like poultry and fish. How do I count calories when eating out?  Ask for smaller portion sizes.  Consider sharing an entree and sides instead of getting your own entree.  If you get your own entree, eat only half. Ask for a box at the beginning of your meal and put the rest of your entree in it so you are not tempted to eat it.  If calories are listed on the menu, choose the lower calorie options.  Choose dishes that include vegetables, fruits, whole grains, low-fat dairy products, and lean protein.  Choose items that are boiled, broiled, grilled, or steamed. Stay away from items that are buttered, battered, fried, or served with cream sauce. Items labeled "crispy" are usually fried, unless stated otherwise.  Choose water, low-fat milk, unsweetened iced tea, or other drinks without added sugar. If you want an alcoholic beverage, choose a lower calorie option such as a glass of wine or light beer.  Ask for dressings, sauces, and syrups on the side. These are usually high in calories, so you should limit the amount you eat.  If you want a salad, choose a garden salad and ask for grilled meats. Avoid extra toppings like bacon, cheese, or fried items. Ask for the dressing on the side, or ask for olive oil and vinegar or lemon to use as dressing.  Estimate how many servings of a food you are given. For example, a serving  of cooked rice is  cup or about the size of half a baseball. Knowing serving sizes will help you be aware of how much food you are eating at restaurants. The list below tells you how big or small some common portion sizes are based on everyday objects: ? 1 oz-4 stacked dice. ? 3 oz-1 deck of cards. ? 1 tsp-1 die. ? 1 Tbsp- a ping-pong ball. ? 2 Tbsp-1 ping-pong ball. ?  cup- baseball. ? 1 cup-1 baseball. Summary  Calorie counting means keeping track of how many calories you eat and drink each day. If you eat fewer calories than your body needs, you should lose weight.  A healthy amount of weight to lose per week is usually 1-2 lb (0.5-0.9 kg). This usually means reducing your daily calorie intake by 500-750 calories.  The number of calories in a food can be found on a Nutrition Facts label. If a food does not have a Nutrition Facts label, try to look up the calories online or ask your dietitian for help.  Use your calories on foods and drinks that will fill you up, and not on foods and drinks that will leave you hungry.  Use smaller plates, glasses, and bowls to prevent overeating. This information is not intended to replace advice given to you by your health care provider. Make sure you discuss any questions you have with your health care provider. Document Released: 10/06/2005 Document Revised: 09/05/2016 Document Reviewed: 09/05/2016 Elsevier Interactive Patient Education  2017 [place].     Heart Failure Action Plan A heart failure action plan helps you understand what to do when you have symptoms of heart failure. Follow the plan that was created by you and your health care provider. Review your plan each time you visit your health care provider. Red zone These signs and symptoms mean you should get medical help right away:  You have trouble breathing when resting.  You have a dry cough that is getting worse.  You have swelling or  pain in your legs or abdomen that is  getting worse.  You suddenly gain more than 2-3 lb (0.9-1.4 kg) in a day, or more than 5 lb (2.3 kg) in one week. This amount may be more or less depending on your condition.  You have trouble staying awake or you feel confused.  You have chest pain.  You do not have an appetite.  You pass out.  If you experience any of these symptoms:  Call your local emergency services (911 in the [place] right away or seek help at the emergency department of the nearest hospital.  Yellow zone These signs and symptoms mean your condition may be getting worse and you should make some changes:  You have trouble breathing when you are active or you need to sleep with extra pillows.  You have swelling in your legs or abdomen.  You gain 2-3 lb (0.9-1.4 kg) in one day, or 5 lb (2.3 kg) in one week. This amount may be more or less depending on your condition.  You get tired easily.  You have trouble sleeping.  You have a dry cough.  If you experience any of these symptoms:  Contact your health care provider within the next day.  Your health care provider may adjust your medicines.  Green zone These signs mean you are doing well and can continue what you are doing:  You do not have shortness of breath.  You have very little swelling or no new swelling.  Your weight is stable (no gain or loss).  You have a normal activity level.  You do not have chest pain or any other new symptoms.  Follow these instructions at home:  Take over-the-counter and prescription medicines only as told by your health care provider.  Weigh yourself daily. Your target weight is __________ lb (__________ kg). ? Call your health care provider if you gain more than __________ lb (__________ kg) in a day, or more than __________ lb (__________ kg) in one week.  Eat a heart-healthy diet. Work with a diet and nutrition specialist (dietitian) to create an eating plan that is best for you.  Keep all follow-up visits  as told by your health care provider. This is important. Where to find more information:  American Heart Association: www.heart.org Summary  Follow the action plan that was created by you and your health care provider.  Get help right away if you have any symptoms in the Red zone. This information is not intended to replace advice given to you by your health care provider. Make sure you discuss any questions you have with your health care provider. Document Released: 11/15/2016 Document Revised: 11/15/2016 Document Reviewed: 11/15/2016 Elsevier Interactive Patient Education  [employer].

## 2017-09-14 NOTE — Progress Notes (Signed)
Patient ID: Brittney Ross, female    DOB: 02-19-1961, 56 y.o.   MRN: [id number]  PCP: Scot Jun, FNP  Chief Complaint  Patient presents with  . Follow-up    Subjective:  HPI Brittney Ross is a 56 y.o. female presents for evaluation of routine follow-up. Medical problems include morbid obesity, hypertension, hyperlipidemia, systolic/diastolic heart failure, and postablative hypothyroidism.  Brittney Ross is followed by cardiology and last EF per echocardiogram 20-25%. She is currently medically managed with Entresto, carvedilol, and diuretic therapy. Brittney Ross reports low sodium and low fat diet. She is continuing to exercise several days per week with walking. Next echocardiogram is scheduled for December. She denies increased shortness of breath, activity intolerance, chest pain, dizziness, or headaches. Brittney Ross also suffers from hypothyroidism and has experienced an elevated TSH in spite of medication therapy with Synthroid. She denies associated symptoms of fatigue, temperature intolerance, or hair thinning/loss.  Social History   Socioeconomic History  . Marital status: Married    Spouse name: Not on file  . Number of children: Not on file  . Years of education: Not on file  . Highest education level: Not on file  Social Needs  . Financial resource strain: Not on file  . Food insecurity - worry: Not on file  . Food insecurity - inability: Not on file  . Transportation needs - medical: Not on file  . Transportation needs - non-medical: Not on file  Occupational History  . Not on file  Tobacco Use  . Smoking status: Never Smoker  . Smokeless tobacco: Never Used  Substance and Sexual Activity  . Alcohol use: No  . Drug use: No  . Sexual activity: Yes    Birth control/protection: Surgical  Other Topics Concern  . Not on file  Social History Narrative  . Not on file    Family History  Problem Relation Age of Onset  . Hypertension Father   . Heart disease Mother   .  Hypertension Sister      Review of Systems  Patient Active Problem List   Diagnosis Date Noted  . Dilated cardiomyopathy ([place])   . Hypokalemia 07/05/2017  . Chronic combined systolic and diastolic heart failure ([place]) 07/05/2017  . Acute pulmonary edema ([facility])   . Chronic female pelvic pain 08/14/2011  . H/O Graves' disease 03/20/2010  . 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 Class 3 obesity in adult 03/20/2008  . Hypertensive heart disease with heart failure ([place]) 03/20/2008  . HYPERCHOLESTEROLEMIA 02/22/2008  . MIGRAINE HEADACHE 02/21/2008  . KNEE PAIN, RIGHT 02/21/2008  . FIBROIDS, UTERUS 02/06/2008  . ANEMIA 02/06/2008    No Known Allergies  Prior to Admission medications   Medication Sig Start Date End Date Taking? Authorizing Provider  albuterol (PROVENTIL HFA;VENTOLIN HFA) 108 (90 Base) MCG/ACT inhaler Inhale 2 puffs every 6 (six) hours as needed into the lungs for wheezing or shortness of breath. 08/25/17  Yes Scot Jun, FNP  aspirin EC 81 MG tablet Take 81 mg by mouth daily.   Yes [provider]  atorvastatin (LIPITOR) 40 MG tablet TAKE 2 TABLETS BY MOUTH ONCE DAILY AT  6  AM 08/25/17  Yes Scot Jun, FNP  atorvastatin (LIPITOR) 40 MG tablet TAKE 2 TABLETS BY MOUTH ONCE DAILY AT 6 AM 09/08/17  Yes Scot Jun, FNP  carvedilol (COREG) 6.25 MG tablet TAKE 1 TABLET BY MOUTH TWICE DAILY WITH A MEAL 09/08/17  Yes Scot Jun, FNP  furosemide (LASIX) 20 MG tablet  Take 1 tablet (20 mg total) by mouth daily as needed for fluid or edema. 08/18/17 11/16/17 Yes Weaver, Scott T, PA-C  levothyroxine (SYNTHROID, LEVOTHROID) 125 MCG tablet Take 1 tablet (125 mcg total) by mouth daily. 07/17/17  Yes Scot Jun, FNP  sacubitril-valsartan (ENTRESTO) 24-26 MG Take 1 tablet by mouth 2 (two) times daily. 07/21/17  Yes Weaver, Scott T, PA-C  spironolactone (ALDACTONE) 25 MG tablet Take 0.5 tablets (12.5 mg total) by mouth daily. 08/18/17 08/13/18  Yes Liliane Shi PA-C    Past Medical, Surgical Family and Social History reviewed and updated.    Objective:   Today's Vitals   09/14/17 0929  BP: 110/64  Pulse: 68  Resp: 14  Temp: 98.7 F (37.1 C)  TempSrc: Oral  SpO2: 96%  Weight: 292 lb (132.5 kg)  Height: 5\' 6"  (1.676 m)    Wt Readings from Last 3 Encounters:  09/14/17 292 lb (132.5 kg)  08/18/17 286 lb 6.4 oz (129.9 kg)  07/21/17 288 lb 12.8 oz (131 kg)    Physical Exam  Constitutional: Patient appears well-developed and well-nourished. No distress. HENT: Normocephalic, atraumatic, External right and left ear normal. Oropharynx is clear and moist.  Eyes: Conjunctivae and EOM are normal. PERRLA, no scleral icterus. Neck: Normal ROM. Neck supple. No JVD. No tracheal deviation. No thyromegaly. CVS: RRR, S1/S2 +, no murmurs, no gallops, no carotid bruit.  Pulmonary: Effort and breath sounds normal, no stridor, rhonchi, wheezes, rales.  Abdominal: Soft. BS +, no distension, tenderness, rebound or guarding.  Musculoskeletal: Normal range of motion. Trace edema and no tenderness.  Lymphadenopathy: No lymphadenopathy noted, cervical, inguinal or axillary Neuro: Alert. Normal reflexes, muscle tone coordination. No cranial nerve deficit. Skin: Skin is warm and dry. No rash noted. Not diaphoretic. No erythema. No pallor. Psychiatric: Normal mood and affect. Behavior, judgment, thought content normal.  Assessment & Plan:  1. Hypothyroidism, postablative, previous TSH mildly elevated. Repeating TSH today. If elevated will consider levothyroxine therapy.  2. Chronic combined systolic and diastolic heart failure ([place]), continue furosemide, Entresto, Aldactone as prescribed.  Continue follow-up with cardiology as indicated.  Patient has a repeat echocardiogram scheduled for December to evaluate the effectiveness of current therapy.  -If you experiencing greater than 3 lb weight gain within 24 hours, notify our clinic or if  after hours report to the Emergency Department. -It is important to keep your blood pressure under control. Monitor blood pressure at home and if you obtain readings greater than 150/90 consecutively over a period of 3 days, notify me here at the office. -Avoid adding table salt or eating food such as can soup or frozen meals which are high in sodium. This increases fluid retention and is likely to worsen your heart failure.  -Read the educational information below thoroughly to aid you in management of your heart failure symptoms.    3. Hypertension, unspecified type, blood pressure well controlled on current regimen.  No changes today.  4. Morbid Obesity, Body mass index is 47.13 kg/m.  Continue weight loss efforts with carbohydrate modified diet, DASH diet, increasing physical activity as tolerated with the recommended 150 minutes/week.  Return for care in 6 months for chronic disease management and TSH recheck.   Carroll Sage. Kenton Kingfisher, MSN, FNP-C [facility]  [address] [place], [place] [address] [phone]

## 2017-09-15 LAB — TSH: TSH: 3.93 mIU/L

## 2017-09-17 ENCOUNTER — Telehealth: Payer: Self-pay | Admitting: *Deleted

## 2017-09-17 NOTE — Telephone Encounter (Signed)
Prior authorization sent for [facility] through covermymeds, it was APPROVED through [employer]. I called patient to let her know since she had already called this morning to  Check status of this. Ask her to call her pharmacy and get them to run through to check status.

## 2017-09-25 ENCOUNTER — Telehealth: Payer: Self-pay

## 2017-09-25 MED ORDER — ATORVASTATIN CALCIUM 80 MG PO TABS: 80.0000 mg | ORAL_TABLET | Freq: Every day | ORAL | 3 refills | Status: DC

## 2017-09-30 ENCOUNTER — Other Ambulatory Visit (HOSPITAL_COMMUNITY): Payer: BLUE CROSS/BLUE SHIELD

## 2017-10-02 ENCOUNTER — Encounter: Payer: Self-pay | Admitting: *Deleted

## 2017-10-06 ENCOUNTER — Ambulatory Visit (HOSPITAL_COMMUNITY): Payer: BLUE CROSS/BLUE SHIELD | Attending: Cardiology

## 2017-10-06 ENCOUNTER — Other Ambulatory Visit: Payer: Self-pay

## 2017-10-06 VITALS — BP 176/83

## 2017-10-06 DIAGNOSIS — I5042 Chronic combined systolic (congestive) and diastolic (congestive) heart failure: Secondary | ICD-10-CM | POA: Insufficient documentation

## 2017-10-06 MED ORDER — PERFLUTREN LIPID MICROSPHERE
1.0000 mL | INTRAVENOUS | Status: AC | PRN
  Administered 2017-10-06: 1 mL via INTRAVENOUS

## 2017-10-08 ENCOUNTER — Other Ambulatory Visit: Payer: BLUE CROSS/BLUE SHIELD | Admitting: *Deleted

## 2017-10-08 ENCOUNTER — Ambulatory Visit: Payer: BLUE CROSS/BLUE SHIELD | Admitting: Cardiovascular Disease

## 2017-10-08 ENCOUNTER — Encounter: Payer: Self-pay | Admitting: Cardiovascular Disease

## 2017-10-08 VITALS — BP 142/80 | HR 65 | Ht 64.0 in | Wt 290.1 lb

## 2017-10-08 DIAGNOSIS — E7800 Pure hypercholesterolemia, unspecified: Secondary | ICD-10-CM | POA: Diagnosis not present

## 2017-10-08 DIAGNOSIS — I5042 Chronic combined systolic (congestive) and diastolic (congestive) heart failure: Secondary | ICD-10-CM

## 2017-10-08 DIAGNOSIS — I10 Essential (primary) hypertension: Secondary | ICD-10-CM | POA: Diagnosis not present

## 2017-10-08 LAB — BASIC METABOLIC PANEL
BUN/Creatinine Ratio: 21 (ref 9–23)
BUN: 19 mg/dL (ref 6–24)
CALCIUM: 9.7 mg/dL (ref 8.7–10.2)
CHLORIDE: 102 mmol/L (ref 96–106)
CO2: 26 mmol/L (ref 20–29)
CREATININE: 0.9 mg/dL (ref 0.57–1.00)
GFR, EST AFRICAN AMERICAN: 83 mL/min/{1.73_m2} (ref >59)
GFR, EST NON AFRICAN AMERICAN: 72 mL/min/{1.73_m2} (ref >59)
Glucose: 107 mg/dL — ABNORMAL HIGH (ref 65–99)
Potassium: 3.9 mmol/L (ref 3.5–5.2)
Sodium: 142 mmol/L (ref 134–144)

## 2017-10-08 MED ORDER — SACUBITRIL-VALSARTAN 49-51 MG PO TABS: 1.0000 | ORAL_TABLET | Freq: Two times a day (BID) | ORAL | 3 refills | Status: DC

## 2017-10-08 NOTE — Progress Notes (Signed)
Cardiology Office Note:    Date:  10/08/2017   ID:  [name], DOB Mar 01, 1961, MRN [id number]  PCP:  Scot Jun, FNP  Cardiologist:  Mertie Moores, MD    Referring MD: Scot Jun, FNP   Problem list  1.  Coronary artery disease-mild coronary artery disease by heart cath September, 2018 2.  Chronic combined systolic and diastolic congestive heart failure 3.  Hyperlipidemia 4.  Essential hypertension 5.  Morbid obesity 6.  Hypothyroidism  Chief Complaint  Patient presents with  . Follow-up    CHF    History of Present Illness:    Brittney Ross is a 56 y.o. female with a hx of chronic combined systolic and diastolic congestive heart failure.  She was seen in October by Richardson Dopp.  She is done well on Entresto and carvedilol.  Her Lasix has been changed to as needed.  She is on spironolactone 12.5 mg a day.  Echocardiogram in December, 2018 reveals improvement of her left ventricular systolic function.  Her left ventricular ejection fraction is now 30-35%.  She has grade 1 diastolic dysfunction.  This ejection fraction is improved from the echocardiogram from September, 2018 when her ejection fraction was 20-25%.  She is watching her diet.  BP has continued to be mildly elevated    Past Medical History:  Diagnosis Date  . Anemia   . Chronic combined systolic and diastolic heart failure (HCC) 07/05/2017   Non-ischemic CM // Echo 9/18:EF 20-25, severe diff HK, Gr 1 DD, mild MR, mod LAE // R/L Heart Cath 9/18: pD1 40, mD1 30; o/w normal cors  . Hyperlipidemia   . Hypertension    Pt states she "doesn't have HTN"  . Morbid obesity (Brittney Ross)   . Thyroid disease hyperthyroidism    Past Surgical History:  Procedure Laterality Date  . CESAREAN SECTION  12/07/79  . CESAREAN SECTION  05/17/81  . CESAREAN SECTION  09/06/89  . RATH/BSO/cystoscopy    . RIGHT/LEFT HEART CATH AND CORONARY ANGIOGRAPHY N/A 07/09/2017   Procedure: RIGHT/LEFT HEART CATH AND CORONARY  ANGIOGRAPHY;  Surgeon: Troy Sine, MD;  Location: [facility];  Service: Cardiovascular;  Laterality: N/A;  . TUBAL LIGATION      Current Medications: Current Meds  Medication Sig  . albuterol (PROVENTIL HFA;VENTOLIN HFA) 108 (90 Base) MCG/ACT inhaler Inhale 2 puffs every 6 (six) hours as needed into the lungs for wheezing or shortness of breath.  Marland Kitchen aspirin EC 81 MG tablet Take 81 mg by mouth daily.  Marland Kitchen atorvastatin (LIPITOR) 80 MG tablet Take 1 tablet (80 mg total) by mouth daily.  . carvedilol (COREG) 6.25 MG tablet TAKE 1 TABLET BY MOUTH TWICE DAILY WITH A MEAL  . furosemide (LASIX) 20 MG tablet Take 1 tablet (20 mg total) by mouth daily as needed for fluid or edema.  Marland Kitchen levothyroxine (SYNTHROID, LEVOTHROID) 125 MCG tablet Take 1 tablet (125 mcg total) by mouth daily.  . sacubitril-valsartan (ENTRESTO) 24-26 MG Take 1 tablet by mouth 2 (two) times daily.  Marland Kitchen spironolactone (ALDACTONE) 25 MG tablet Take 0.5 tablets (12.5 mg total) by mouth daily.     Allergies:   Patient has no known allergies.   Social History   Socioeconomic History  . Marital status: Married    Spouse name: None  . Number of children: None  . Years of education: None  . Highest education level: None  Social Needs  . Financial resource strain: None  . Food insecurity -  worry: None  . Food insecurity - inability: None  . Transportation needs - medical: None  . Transportation needs - non-medical: None  Occupational History  . None  Tobacco Use  . Smoking status: Never Smoker  . Smokeless tobacco: Never Used  Substance and Sexual Activity  . Alcohol use: No  . Drug use: No  . Sexual activity: Yes    Birth control/protection: Surgical  Other Topics Concern  . None  Social History Narrative  . None     Family History: The patient's family history includes Heart disease in her mother; Hypertension in her father and sister. ROS:   Please see the history of present illness.     All other  systems reviewed and are negative.  EKGs/Labs/Other Studies Reviewed:    The following studies were reviewed today:   EKG:     Recent Labs: 07/05/2017: B Natriuretic Peptide 953.6 07/07/2017: Magnesium 2.0 07/17/2017: ALT 18; Hemoglobin 12.0; Platelets 185 09/01/2017: BUN 17; Creatinine, Ser 0.89; Potassium 4.0; Sodium 142 09/14/2017: TSH 3.93  Recent Lipid Panel    Component Value Date/Time   CHOL 228 (H) 07/10/2017 0419   TRIG 92 07/10/2017 0419   HDL 29 (L) 07/10/2017 0419   CHOLHDL 7.9 07/10/2017 0419   VLDL 18 07/10/2017 0419   LDLCALC 181 (H) 07/10/2017 0419    Physical Exam:    VS:  BP (!) 142/80   Pulse 65   Ht 5\' 4"  (1.626 m)   Wt 290 lb 1.9 oz (131.6 kg)   LMP 10/28/2011   SpO2 99%   BMI 49.80 kg/m     Wt Readings from Last 3 Encounters:  10/08/17 290 lb 1.9 oz (131.6 kg)  09/14/17 292 lb (132.5 kg)  08/18/17 286 lb 6.4 oz (129.9 kg)     GEN:   Obese middle age female    no acute distress HEENT: Normal NECK: No JVD; No carotid bruits LYMPHATICS: No lymphadenopathy CARDIAC: RR, no murmurs, rubs, gallops RESPIRATORY:  Clear to auscultation without rales, wheezing or rhonchi  ABDOMEN: Soft, non-tender, non-distended MUSCULOSKELETAL:  No edema; No deformity  SKIN: Warm and dry NEUROLOGIC:  Alert and oriented x 3 PSYCHIATRIC:  Normal affect   ASSESSMENT:    No diagnosis found. PLAN:    In order of problems listed above:  1. 1.  Chronic combined systolic and diastolic congestive heart failure: [name] seems to be feeling better.  Her ejection fraction has gradually improved.  We will continue to uptitrate her Entresto.  We will now start Entresto 49 mg / 51 mg.  She will return in 2-3 weeks for a basic metabolic profile. Blood Pressure is in the 140s so I do not expect her to become hypotensive.  She will return to see Richardson Dopp, PA in 3 months for follow-up office visit.   Medication Adjustments/Labs and Tests Ordered: Current medicines are  reviewed at length with the patient today.  Concerns regarding medicines are outlined above.  No orders of the defined types were placed in this encounter.  No orders of the defined types were placed in this encounter.   Signed, Mertie Moores, MD  10/08/2017 10:22 AM    [facility]

## 2017-10-08 NOTE — Patient Instructions (Addendum)
Medication Instructions:  Your physician has recommended you make the following change in your medication:  1-INCREASE Entresto 49/51 mg by mouth twice daily   Labwork: Your physician recommends that you return for lab work in: 3 weeks for BMET   Testing/Procedures: NONE  Follow-Up: Your physician recommends that you schedule a follow-up appointment in: 3 weeks for BP check as nurse visit. Please schedule on 10/29/17 in the afternoon for Dr. Elmarie Shiley nurse, Sharyn Lull to check.  Your physician recommends that you schedule a follow-up appointment in: 3 months with Richardson Dopp PA.   If you need a refill on your cardiac medications before your next appointment, please call your pharmacy.

## 2017-10-29 ENCOUNTER — Ambulatory Visit (INDEPENDENT_AMBULATORY_CARE_PROVIDER_SITE_OTHER): Payer: BLUE CROSS/BLUE SHIELD | Admitting: Nurse Practitioner

## 2017-10-29 ENCOUNTER — Other Ambulatory Visit: Payer: BLUE CROSS/BLUE SHIELD | Admitting: *Deleted

## 2017-10-29 VITALS — BP 150/76 | HR 76 | Ht 64.0 in | Wt 295.5 lb

## 2017-10-29 DIAGNOSIS — I10 Essential (primary) hypertension: Secondary | ICD-10-CM | POA: Diagnosis not present

## 2017-10-29 DIAGNOSIS — E7800 Pure hypercholesterolemia, unspecified: Secondary | ICD-10-CM

## 2017-10-29 DIAGNOSIS — I5042 Chronic combined systolic (congestive) and diastolic (congestive) heart failure: Secondary | ICD-10-CM

## 2017-10-29 NOTE — Patient Instructions (Signed)
Medication Instructions:  Your physician recommends that you continue on your current medications as directed. Please refer to the Current Medication list given to you today. I will call you with any changes after results of lab work available.  Labwork: TODAY - basic metabolic panel   Testing/Procedures: None Ordered   Follow-Up: Your physician recommends that you return for a follow-up appointment on March 13 with Richardson Dopp, PA at 10:15 am   If you need a refill on your cardiac medications before your next appointment, please call your pharmacy.   Thank you for choosing [facility]! Christen Bame, RN [phone]

## 2017-10-29 NOTE — Progress Notes (Signed)
1.) Reason for visit: BP check  2.) Name of MD requesting visit: Dr. Acie Fredrickson  3.) H&P: patient seen on 10/08/17 by Dr. Acie Fredrickson in the office and advised to increase Entresto dose to 49/51 mg twice daily  4.) ROS related to problem: patient denies complaints; denies dizziness, light-headedness or SOB; states she is not taking the lasix often  She presents today in NAD and ambulates without  difficulty  5.) Assessment and plan per MD: BP today is 150/76 mmHg, pulse 76 bpm. She states her home BP monitor reports similar BP readings. She denies eating a high salt diet. I encouraged her to continue to monitor and advised I will call back with additional advice after visit information and lab results are reviewed by Dr. Acie Fredrickson  No additional advice from Dr. Acie Fredrickson, keep follow-up appointment with Richardson Dopp on 3/13

## 2017-10-30 LAB — BASIC METABOLIC PANEL
BUN/Creatinine Ratio: 23 (ref 9–23)
BUN: 17 mg/dL (ref 6–24)
CALCIUM: 8.8 mg/dL (ref 8.7–10.2)
CHLORIDE: 108 mmol/L — AB (ref 96–106)
CO2: 24 mmol/L (ref 20–29)
Creatinine, Ser: 0.73 mg/dL (ref 0.57–1.00)
GFR calc Af Amer: 107 mL/min/{1.73_m2} (ref >59)
GFR, EST NON AFRICAN AMERICAN: 93 mL/min/{1.73_m2} (ref >59)
GLUCOSE: 96 mg/dL (ref 65–99)
POTASSIUM: 3.8 mmol/L (ref 3.5–5.2)
SODIUM: 145 mmol/L — AB (ref 134–144)

## 2017-11-12 ENCOUNTER — Telehealth: Payer: Self-pay | Admitting: Cardiovascular Disease

## 2017-11-12 NOTE — Telephone Encounter (Signed)
New message   Pt c/o medication issue:  1. Name of Medication:   2. How are you currently taking this medication (dosage and times per day)?   3. Are you having a reaction (difficulty breathing--STAT)?   4. What is your medication issue? Patient has had a head cold and headache but cant take certain meds due to heart and her other medications she already on Pt wants to know what she is allowed to take please call

## 2017-11-12 NOTE — Telephone Encounter (Signed)
Spoke with patient who c/o headache and cold symptoms and asks what she can take. I advised she may take Mucinex or Coricidin HBP without decongestant. I advised she may also take Tylenol for her headache. She verbalized understanding and thanked me for the call.

## 2017-11-24 ENCOUNTER — Encounter: Payer: Self-pay | Admitting: Family Medicine

## 2017-11-24 ENCOUNTER — Telehealth: Payer: Self-pay

## 2017-11-24 ENCOUNTER — Ambulatory Visit: Payer: BLUE CROSS/BLUE SHIELD | Admitting: Family Medicine

## 2017-11-24 VITALS — BP 140/70 | HR 75 | Temp 98.3°F | Resp 12 | Ht 64.0 in | Wt 298.0 lb

## 2017-11-24 DIAGNOSIS — J069 Acute upper respiratory infection, unspecified: Secondary | ICD-10-CM | POA: Diagnosis not present

## 2017-11-24 MED ORDER — PREDNISONE 20 MG PO TABS: 40.0000 mg | ORAL_TABLET | Freq: Every day | ORAL | 0 refills | Status: DC

## 2017-11-24 MED ORDER — IPRATROPIUM BROMIDE 0.03 % NA SOLN: 2.0000 | Freq: Two times a day (BID) | NASAL | 0 refills | Status: DC

## 2017-11-24 MED ORDER — AZITHROMYCIN 250 MG PO TABS: ORAL_TABLET | ORAL | 0 refills | Status: DC

## 2017-11-24 MED ORDER — BENZONATATE 100 MG PO CAPS: 100.0000 mg | ORAL_CAPSULE | Freq: Three times a day (TID) | ORAL | 0 refills | Status: DC | PRN

## 2017-11-24 NOTE — Progress Notes (Signed)
Patient ID: Brittney Ross, female    DOB: [birth year], 57 y.o.   MRN: [id number]  PCP: Scot Jun, FNP  Chief Complaint  Patient presents with  . Cough  . Nasal Congestion  . Sore Throat    Subjective:  HPI Brittney Ross is a 57 y.o. female with a history of heart disease, hypertension, and hypothyroidism presents for evaluation of cough, nasal congestion, and sore throat.  She complains of headaches, sinus congestion, with clear nasal sputum, and non-productive cough for period of 1 week.  She reports 5 days of onset of sore throat.  Denies shortness of breath, wheezing, fever, or worsening fatigue.  She has been able to maintain her normal activities of daily living however just reports symptoms are bothersome.  She is only taken Tylenol did not attempted any OTC medications due to her cardiovascular disease.  Social History   Socioeconomic History  . Marital status: Married    Spouse name: Not on file  . Number of children: Not on file  . Years of education: Not on file  . Highest education level: Not on file  Social Needs  . Financial resource strain: Not on file  . Food insecurity - worry: Not on file  . Food insecurity - inability: Not on file  . Transportation needs - medical: Not on file  . Transportation needs - non-medical: Not on file  Occupational History  . Not on file  Tobacco Use  . Smoking status: Never Smoker  . Smokeless tobacco: Never Used  Substance and Sexual Activity  . Alcohol use: No  . Drug use: No  . Sexual activity: Yes    Birth control/protection: Surgical  Other Topics Concern  . Not on file  Social History Narrative  . Not on file    Family History  Problem Relation Age of Onset  . Hypertension Father   . Heart disease Mother   . Hypertension Sister    Review of Systems Pertinent negatives indicated in HPI Patient Active Problem List   Diagnosis Date Noted  . HTN (hypertension) 09/14/2017  . Dilated cardiomyopathy ([place])   .  Hypokalemia 07/05/2017  . Chronic combined systolic and diastolic heart failure ([place]) 07/05/2017  . Acute pulmonary edema (HCC)   . Chronic female pelvic pain 08/14/2011  . H/O Graves' disease 03/20/2010  . 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 Class 3 obesity in adult 03/20/2008  . Hypertensive heart disease with heart failure ([place]) 03/20/2008  . HYPERCHOLESTEROLEMIA 02/22/2008  . MIGRAINE HEADACHE 02/21/2008  . KNEE PAIN, RIGHT 02/21/2008  . FIBROIDS, UTERUS 02/06/2008  . ANEMIA 02/06/2008    No Known Allergies  Prior to Admission medications   Medication Sig Start Date End Date Taking? Authorizing Provider  albuterol (PROVENTIL HFA;VENTOLIN HFA) 108 (90 Base) MCG/ACT inhaler Inhale 2 puffs every 6 (six) hours as needed into the lungs for wheezing or shortness of breath. 08/25/17  Yes Scot Jun, FNP  aspirin EC 81 MG tablet Take 81 mg by mouth daily.   Yes [provider]  atorvastatin (LIPITOR) 80 MG tablet Take 1 tablet (80 mg total) by mouth daily. 09/25/17  Yes Scot Jun, FNP  carvedilol (COREG) 6.25 MG tablet TAKE 1 TABLET BY MOUTH TWICE DAILY WITH A MEAL 09/08/17  Yes Scot Jun, FNP  levothyroxine (SYNTHROID, LEVOTHROID) 125 MCG tablet Take 1 tablet (125 mcg total) by mouth daily. 07/17/17  Yes Scot Jun, FNP  sacubitril-valsartan (ENTRESTO) 49-51 MG Take 1 tablet by  mouth 2 (two) times daily. 10/08/17  Yes Nahser, Wonda Cheng, MD  spironolactone (ALDACTONE) 25 MG tablet Take 0.5 tablets (12.5 mg total) by mouth daily. 08/18/17 08/13/18 Yes Weaver, Scott T, PA-C  furosemide (LASIX) 20 MG tablet Take 1 tablet (20 mg total) by mouth daily as needed for fluid or edema. 08/18/17 11/16/17  Liliane Shi, PA-C    Past Medical, Surgical Family and Social History reviewed and updated.    Objective:   Today's Vitals   11/24/17 1045  BP: 140/70  Pulse: 75  Resp: 12  Temp: 98.3 F (36.8 C)  TempSrc: Oral  SpO2: 97%  Weight: 298 lb  (135.2 kg)  Height: 5\' 4"  (1.626 m)    Wt Readings from Last 3 Encounters:  11/24/17 298 lb (135.2 kg)  10/29/17 295 lb 8 oz (134 kg)  10/08/17 290 lb 1.9 oz (131.6 kg)    Physical Exam  Constitutional: She is oriented to person, place, and time. She appears well-developed and well-nourished.  HENT:  Right Ear: Hearing, tympanic membrane, external ear and ear canal normal.  Left Ear: Hearing, tympanic membrane, external ear and ear canal normal.  Nose: Rhinorrhea present.  Mouth/Throat: Uvula is midline and oropharynx is clear and moist.  Eyes: Conjunctivae are normal. Pupils are equal, round, and reactive to light.  Cardiovascular: Normal rate, regular rhythm, normal heart sounds and intact distal pulses.  Pulmonary/Chest: Effort normal. She has decreased breath sounds. She has no wheezes. She exhibits no tenderness.  Lymphadenopathy:    She has cervical adenopathy.  Neurological: She is alert and oriented to person, place, and time.  Skin: Skin is warm.  Psychiatric: She has a normal mood and affect. Her behavior is normal. Judgment and thought content normal.    Assessment & Plan:  1. Upper respiratory tract infection, unspecified type, will treat initially symptomatically.  Patient has been provided an antibiotic prescription which is been printed off and advised not to start medication unless symptoms persist for greater than 5 days.  Meds ordered this encounter  Medications  . benzonatate (TESSALON) 100 MG capsule    Sig: Take 1-2 capsules (100-200 mg total) by mouth 3 (three) times daily as needed for cough.    Dispense:  40 capsule    Refill:  0    Order Specific Question:   Supervising Provider    Answer:   Tresa Garter [id number]  . ipratropium (ATROVENT) 0.03 % nasal spray    Sig: Place 2 sprays into both nostrils 2 (two) times daily.    Dispense:  30 mL    Refill:  0    Order Specific Question:   Supervising Provider    Answer:   Tresa Garter  [id number]  . predniSONE (DELTASONE) 20 MG tablet    Sig: Take 2 tablets (40 mg total) by mouth daily with breakfast.    Dispense:  10 tablet    Refill:  0    Order Specific Question:   Supervising Provider    Answer:   Tresa Garter [id number]  . azithromycin (ZITHROMAX) 250 MG tablet    Sig: Take 2 tabs PO x 1 dose, then 1 tab PO QD x 4 days    Dispense:  6 tablet    Refill:  0    Order Specific Question:   Supervising Provider    Answer:   Tresa Garter [[id number]]    Return as scheduled follow-up on file or sooner if symptoms do not improve.  Carroll Sage. Kenton Kingfisher, MSN, FNP-C [facility]  [address] [place], [place] [address] [phone]

## 2017-11-24 NOTE — Patient Instructions (Addendum)
Her symptoms are consistent at present with a upper respiratory infection.  I will treat to symptomatically for now.  I am starting you on prednisone 40 mg with breakfast times 5 days.  For your cough, I am prescribing you benzonatate 100-200 mg up to 3 times daily as needed for cough.  For congestion symptoms I am prescribing ipratropium 1 spray per nare twice daily as needed for congestion.   If no improvement within 5 days she may fill prescription for antibiotic -azithromycin and take as directed.      Upper Respiratory Infection, Adult Most upper respiratory infections (URIs) are caused by a virus. A URI affects the nose, throat, and upper air passages. The most common type of URI is often called "the common cold." Follow these instructions at home:  Take medicines only as told by your doctor.  Gargle warm saltwater or take cough drops to comfort your throat as told by your doctor.  Use a warm mist humidifier or inhale steam from a shower to increase air moisture. This may make it easier to breathe.  Drink enough fluid to keep your pee (urine) clear or pale yellow.  Eat soups and other clear broths.  Have a healthy diet.  Rest as needed.  Go back to work when your fever is gone or your doctor says it is okay. ? You may need to stay home longer to avoid giving your URI to others. ? You can also wear a face mask and wash your hands often to prevent spread of the virus.  Use your inhaler more if you have asthma.  Do not use any tobacco products, including cigarettes, chewing tobacco, or electronic cigarettes. If you need help quitting, ask your doctor. Contact a doctor if:  You are getting worse, not better.  Your symptoms are not helped by medicine.  You have chills.  You are getting more short of breath.  You have brown or red mucus.  You have yellow or brown discharge from your nose.  You have pain in your face, especially when you bend forward.  You have a  fever.  You have puffy (swollen) neck glands.  You have pain while swallowing.  You have white areas in the back of your throat. Get help right away if:  You have very bad or constant: ? Headache. ? Ear pain. ? Pain in your forehead, behind your eyes, and over your cheekbones (sinus pain). ? Chest pain.  You have long-lasting (chronic) lung disease and any of the following: ? Wheezing. ? Long-lasting cough. ? Coughing up blood. ? A change in your usual mucus.  You have a stiff neck.  You have changes in your: ? Vision. ? Hearing. ? Thinking. ? Mood. This information is not intended to replace advice given to you by your health care provider. Make sure you discuss any questions you have with your health care provider. Document Released: 03/24/2008 Document Revised: 06/08/2016 Document Reviewed: 01/11/2014 Elsevier Interactive Patient Education  2018 [employer].

## 2017-12-10 NOTE — Telephone Encounter (Signed)
Medication Refill

## 2017-12-18 IMAGING — DX DG CHEST 2V
2 series · 2 of 2 positions shown · non-contrast
Comparison: 10/09/2016.

CLINICAL DATA: Nonproductive cough and dyspnea, onset 1 week ago.

EXAM:
CHEST  2 VIEW

[chest pa]
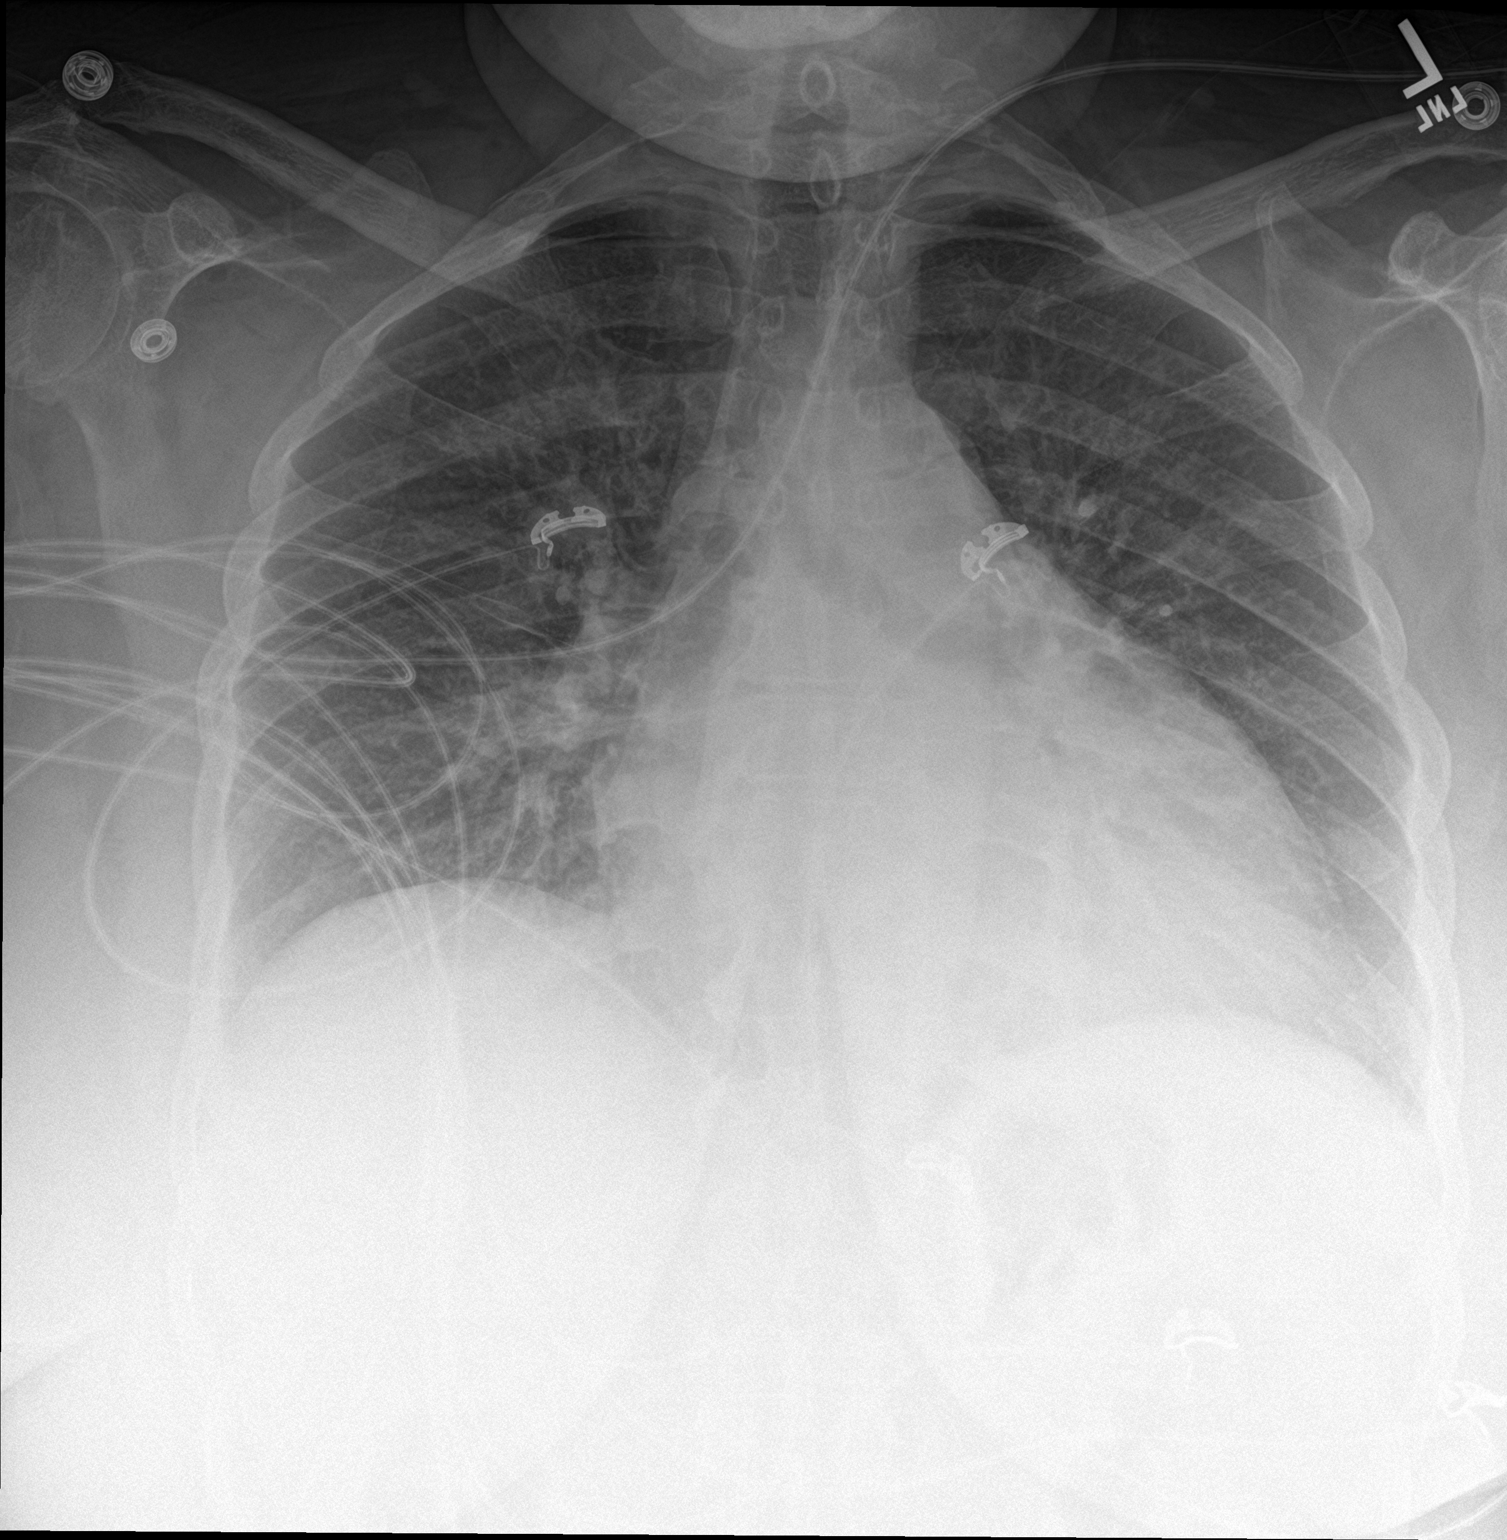

[chest lat]
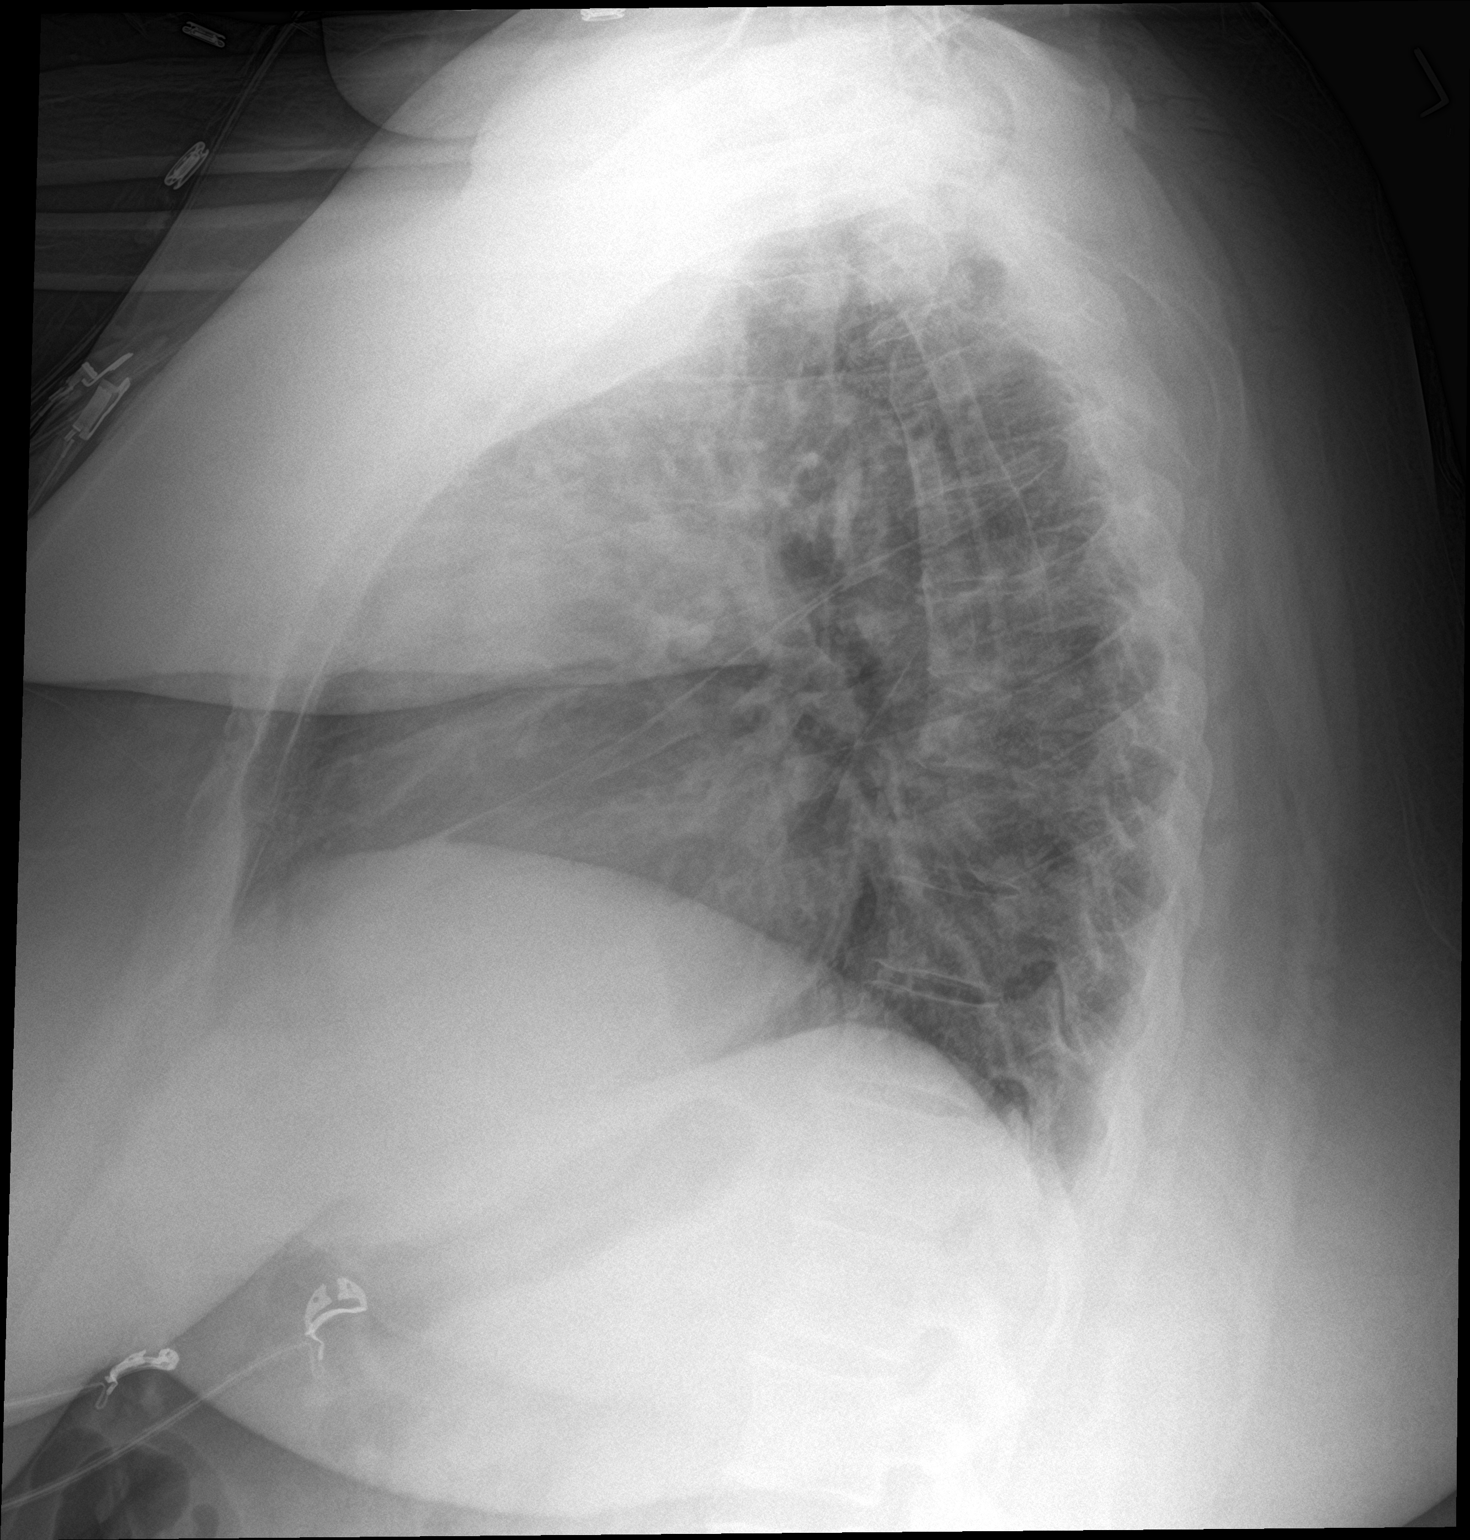

[2 of 2 positions shown; findings below may reference images not displayed]

FINDINGS: Moderately large cardiac silhouette. The lungs are clear. The
pulmonary vasculature is normal. No pleural effusion. Unremarkable
hilar and mediastinal contours.
IMPRESSION: Moderate enlargement of the cardiac silhouette, probably unchanged
from 10/09/2016. No consolidation. No pleural effusion

## 2017-12-22 ENCOUNTER — Ambulatory Visit: Payer: BLUE CROSS/BLUE SHIELD | Admitting: Internal Medicine

## 2017-12-22 ENCOUNTER — Telehealth: Payer: Self-pay

## 2017-12-22 VITALS — BP 142/84 | HR 72 | Ht 64.0 in | Wt 300.2 lb

## 2017-12-22 DIAGNOSIS — Z6841 Body Mass Index (BMI) 40.0 and over, adult: Secondary | ICD-10-CM | POA: Diagnosis not present

## 2017-12-22 DIAGNOSIS — Z8639 Personal history of other endocrine, nutritional and metabolic disease: Secondary | ICD-10-CM

## 2017-12-22 DIAGNOSIS — E890 Postprocedural hypothyroidism: Secondary | ICD-10-CM

## 2017-12-22 LAB — T4, FREE: FREE T4: 1.1 ng/dL (ref 0.60–1.60)

## 2017-12-22 LAB — TSH: TSH: 2.07 u[IU]/mL (ref 0.35–4.50)

## 2017-12-22 MED ORDER — LEVOTHYROXINE SODIUM 125 MCG PO TABS: 125.0000 ug | ORAL_TABLET | Freq: Every day | ORAL | 3 refills | Status: DC

## 2017-12-22 NOTE — Telephone Encounter (Signed)
Spoke to patient. Gave results. Pt verbalized understanding.  

## 2017-12-22 NOTE — Progress Notes (Addendum)
Patient ID: Brittney Ross, female   DOB: December 05, 1960, 57 y.o.   MRN: [id number]   HPI  Brittney Ross is a 57 y.o.-year-old female, returning for h/o Graves Ds, with post-surgical hypothyroidism. Last visit 6 months ago.  She was admitted for CHF in 06/2017.  Now started Lasix, Entresto, Carvedilol, Spironolactone.  She had a URI 3 weeks ago. She was on Prednisone, now finished.  Reviewed and addended history: Pt. has been dx with Graves Ds. in 03/2010. She tells me that she was feeling poorly and lost 50 lbs before dx.   03/29/2010: Thyroid ultrasound: inhomogeneous and nodular thyroid, only a single solid nodule measured in left upper lobe, measuring 7 x 4 x 5 cm. Otherwise, small hypo-echogenic nodules.  04/17/2010: Thyroid uptake and scan: uptake 60.7% (10-35%), relatively homogeneous, without focal lesions  After dx of Graves Ds, he was seen by Dr. Janey Greaser (endocrinologist) who performed RAI ablation in 05/24/2013 >> developed post ablative hypothyroidism.  This was initially uncontrolled due to noncompliance, now finally improved.  Reviewed TFTs - normal at latest check Lab Results  Component Value Date   TSH 3.93 09/14/2017   TSH 4.67 (H) 07/17/2017   TSH 8.462 (H) 07/08/2017   TSH 3.75 06/24/2017   TSH 1.01 03/02/2017   TSH 12.72 (H) 12/25/2016   TSH 28.74 (H) 10/17/2016   TSH 36.51 (H) 06/29/2015   TSH 6.26 (H) 05/10/2013   TSH 0.005 (L) 03/25/2010   FREET4 1.01 07/08/2017   FREET4 1.35 06/24/2017   FREET4 1.68 (H) 03/02/2017   FREET4 0.85 12/25/2016   FREET4 0.60 10/17/2016   FREET4 0.38 (L) 06/29/2015   FREET4 0.54 (L) 05/10/2013   FREET4 2.34 (H) 03/26/2010   Pt is on levothyroxine 125 mcg daily (dose increased to 12/2016), taken: - in am - fasting - at least 30 min from b'fast - no Ca, Fe, MVI, PPIs - not on Biotin  Pt denies: - feeling nodules in neck - hoarseness - dysphagia - choking - SOB with lying down  She also has a history of Hypertension,  hyperlipidemia, anemia - fibroids - status post hysterectomy 11/02/2011.  ROS: Constitutional: no weight gain/no weight loss, no fatigue, no subjective hyperthermia, no subjective hypothermia Eyes: no blurry vision, no xerophthalmia ENT: no sore throat, + see HPI Cardiovascular: no CP/no SOB/no palpitations/no leg swelling Respiratory: no cough/no SOB/no wheezing Gastrointestinal: no N/no V/no D/no C/no acid reflux Musculoskeletal: no muscle aches/no joint aches Skin: no rashes, no hair loss Neurological: no tremors/no numbness/no tingling/no dizziness  I reviewed pt's medications, allergies, PMH, social hx, family hx, and changes were documented in the history of present illness. Otherwise, unchanged from my initial visit note.  Past Medical History:  Diagnosis Date  . Anemia   . Chronic combined systolic and diastolic heart failure (HCC) 07/05/2017   Non-ischemic CM // Echo 9/18:EF 20-25, severe diff HK, Gr 1 DD, mild MR, mod LAE // R/L Heart Cath 9/18: pD1 40, mD1 30; o/w normal cors  . Hyperlipidemia   . Hypertension    Pt states she "doesn't have HTN"  . Morbid obesity ([place])   . Thyroid disease hyperthyroidism   Past Surgical History:  Procedure Laterality Date  . CESAREAN SECTION  12/07/79  . CESAREAN SECTION  05/17/81  . CESAREAN SECTION  09/06/89  . RATH/BSO/cystoscopy    . RIGHT/LEFT HEART CATH AND CORONARY ANGIOGRAPHY N/A 07/09/2017   Procedure: RIGHT/LEFT HEART CATH AND CORONARY ANGIOGRAPHY;  Surgeon: Troy Sine, MD;  Location: [place]  [facility];  Service: Cardiovascular;  Laterality: N/A;  . TUBAL LIGATION     History   Social History  . Marital Status: Married    Spouse Name: N/A    Number of Children: 3   Occupational History  . 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 Smoking status: Never Smoker   . Smokeless tobacco: Not on file  . Alcohol Use: No  . Drug Use: No  . Sexually Active: Yes    Birth Control/ Protection:  Surgical   Current Outpatient Medications  Medication Sig Dispense Refill  . albuterol (PROVENTIL HFA;VENTOLIN HFA) 108 (90 Base) MCG/ACT inhaler Inhale 2 puffs every 6 (six) hours as needed into the lungs for wheezing or shortness of breath. 8 g 2  . aspirin EC 81 MG tablet Take 81 mg by mouth daily.    Marland Kitchen atorvastatin (LIPITOR) 80 MG tablet Take 1 tablet (80 mg total) by mouth daily. 30 tablet 3  . azithromycin (ZITHROMAX) 250 MG tablet Take 2 tabs PO x 1 dose, then 1 tab PO QD x 4 days 6 tablet 0  . benzonatate (TESSALON) 100 MG capsule Take 1-2 capsules (100-200 mg total) by mouth 3 (three) times daily as needed for cough. 40 capsule 0  . carvedilol (COREG) 6.25 MG tablet TAKE 1 TABLET BY MOUTH TWICE DAILY WITH A MEAL 180 tablet 2  . furosemide (LASIX) 20 MG tablet Take 1 tablet (20 mg total) by mouth daily as needed for fluid or edema. 90 tablet 3  . ipratropium (ATROVENT) 0.03 % nasal spray Place 2 sprays into both nostrils 2 (two) times daily. 30 mL 0  . levothyroxine (SYNTHROID, LEVOTHROID) 125 MCG tablet Take 1 tablet (125 mcg total) by mouth daily. 90 tablet 1  . predniSONE (DELTASONE) 20 MG tablet Take 2 tablets (40 mg total) by mouth daily with breakfast. 10 tablet 0  . sacubitril-valsartan (ENTRESTO) 49-51 MG Take 1 tablet by mouth 2 (two) times daily. 60 tablet 3  . spironolactone (ALDACTONE) 25 MG tablet Take 0.5 tablets (12.5 mg total) by mouth daily. 15 tablet 11   No current facility-administered medications for this visit.    No Known Allergies  Family History  Problem Relation Age of Onset  . Hypertension Father   . Heart disease Mother   . Hypertension Sister    PE: BP (!) 142/84   Pulse 72   Ht 5\' 4"  (1.626 m)   Wt (!) 300 lb 3.2 oz (136.2 kg)   LMP 10/28/2011   SpO2 96%   BMI 51.53 kg/m  Body mass index is 51.53 kg/m. Wt Readings from Last 3 Encounters:  12/22/17 (!) 300 lb 3.2 oz (136.2 kg)  11/24/17 298 lb (135.2 kg)  10/29/17 295 lb 8 oz (134 kg)    Constitutional: overweight, in NAD Eyes: PERRLA, EOMI, no exophthalmos ENT: moist mucous membranes, no thyromegaly, no cervical lymphadenopathy Cardiovascular: RRR, No MRG Respiratory: CTA B Gastrointestinal: abdomen soft, NT, ND, BS+ Musculoskeletal: no deformities, strength intact in all 4 Skin: moist, warm, no rashes Neurological: no tremor with outstretched hands, DTR normal in all 4  ASSESSMENT: 1. History of Graves' disease - s/p RAI ablation 05/2010  2. Hypothyroidism -post ablative  3. Obesity BMI Classification:  < 18.5 underweight   18.5-24.9 normal weight   25.0-29.9 overweight   30.0-34.9 class I obesity   35.0-39.9 class II obesity   ? 40.0 class III obesity   PLAN:  1. and 2.  Patient with previous history of Graves' disease, S/P RAI ablation in 2011, started on levothyroxine afterwards for post ablative hypothyroidism.  We had problems with medications and visit noncompliance but these improved, so she is now on a stable dose on 125 mcg daily of levothyroxine.  She mentions she is not skipping doses anymore. - latest thyroid labs reviewed with pt >> finally normal! - she continues on LT4 125 Mcg daily - pt feels good on this dose. - we discussed about taking the thyroid hormone every day, with water, >30 minutes before breakfast, separated by >4 hours from acid reflux medications, calcium, iron, multivitamins. Pt. is taking it correctly. - will check thyroid tests today: TSH and fT4 - She has no signs of Graves' ophthalmopathy.  We will not check her TSI antibodies for now - If labs are abnormal, she will need to return for repeat TFTs in 1.5 months  - otherwise, return to see me in 1 year  2. Obesity class 3 -She lost approximately 25 pounds before last visit, most likely due to normalizing her thyroid status -She did gain weight afterwards, but not the entire 20 pounds, but 8 lbs (feels this may be fluid)  Needs refill LT4.  Office Visit on  12/22/2017  Component Date Value Ref Range Status  . TSH 12/22/2017 2.07  0.35 - 4.50 uIU/mL Final  . Free T4 12/22/2017 1.10  0.60 - 1.60 ng/dL Final   Comment: Specimens from patients who are undergoing biotin therapy and /or ingesting biotin supplements may contain high levels of biotin.  The higher biotin concentration in these specimens interferes with this Free T4 assay.  Specimens that contain high levels  of biotin may cause false high results for this Free T4 assay.  Please interpret results in light of the total clinical presentation of the patient.     Her TFTs remain normal.  Philemon Kingdom, MD PhD [facility] Endocr[facility]

## 2017-12-22 NOTE — Telephone Encounter (Signed)
-----   Message from Philemon Kingdom, MD sent at 12/22/2017  1:08 PM EST ----- Loma Sousa, can you please call pt: Wonderful news: Her TFTs remain normal.

## 2017-12-22 NOTE — Addendum Note (Signed)
Addended by: Philemon Kingdom on: 12/22/2017 01:09 PM   Modules accepted: Orders

## 2017-12-22 NOTE — Patient Instructions (Signed)
Please stop at the lab.  Continue Levothyroxine 125 mcg daily.  Take the thyroid hormone every day, with water, at least 30 minutes before breakfast, separated by at least 4 hours from: - acid reflux medications - calcium - iron - multivitamins  Please come back for a follow-up appointment in 1 year.  If we end up changing the Levothyroxine dose, we need a repeat set of labs in 5-6 weeks from now.

## 2017-12-23 ENCOUNTER — Encounter: Payer: Self-pay | Admitting: Physician Assistant

## 2017-12-24 NOTE — Telephone Encounter (Signed)
Note not needed 

## 2017-12-30 ENCOUNTER — Ambulatory Visit: Payer: BLUE CROSS/BLUE SHIELD | Admitting: Physician Assistant

## 2017-12-30 ENCOUNTER — Encounter: Payer: Self-pay | Admitting: Physician Assistant

## 2017-12-30 VITALS — BP 128/80 | HR 73 | Ht 64.0 in | Wt 303.0 lb

## 2017-12-30 DIAGNOSIS — I2510 Atherosclerotic heart disease of native coronary artery without angina pectoris: Secondary | ICD-10-CM

## 2017-12-30 DIAGNOSIS — I110 Hypertensive heart disease with heart failure: Secondary | ICD-10-CM | POA: Diagnosis not present

## 2017-12-30 DIAGNOSIS — I5042 Chronic combined systolic (congestive) and diastolic (congestive) heart failure: Secondary | ICD-10-CM

## 2017-12-30 LAB — LIPID PANEL
CHOL/HDL RATIO: 3.4 ratio (ref 0.0–4.4)
Cholesterol, Total: 171 mg/dL (ref 100–199)
HDL: 50 mg/dL (ref >39)
LDL CALC: 104 mg/dL — AB (ref 0–99)
Triglycerides: 84 mg/dL (ref 0–149)
VLDL CHOLESTEROL CAL: 17 mg/dL (ref 5–40)

## 2017-12-30 LAB — HEPATIC FUNCTION PANEL
ALK PHOS: 73 IU/L (ref 39–117)
ALT: 16 IU/L (ref 0–32)
AST: 9 IU/L (ref 0–40)
Albumin: 3.7 g/dL (ref 3.5–5.5)
Bilirubin Total: 0.4 mg/dL (ref 0.0–1.2)
Bilirubin, Direct: 0.12 mg/dL (ref 0.00–0.40)
TOTAL PROTEIN: 6.9 g/dL (ref 6.0–8.5)

## 2017-12-30 MED ORDER — CARVEDILOL 3.125 MG PO TABS: 3.1250 mg | ORAL_TABLET | Freq: Two times a day (BID) | ORAL | 3 refills | Status: DC

## 2017-12-30 MED ORDER — CARVEDILOL 6.25 MG PO TABS: 6.2500 mg | ORAL_TABLET | Freq: Two times a day (BID) | ORAL | 3 refills | Status: DC

## 2017-12-30 NOTE — Patient Instructions (Addendum)
Medication Instructions:  INCREASE COREG TO 9.375 MG TWICE DAILY; YOU WILL TAKE 6.25 MG TABLET ALONG WITH THE 3.125 MG TABLET TO = 9.375 MG; YOU WILL DO THIS TWICE DAILY  Labwork: TODAY FASTING LIPID AND LIVER PANEL  Testing/Procedures: NONE ORDERED TODAY  Follow-Up: Chelsea Primus February 12, 2018 @ 9:45 AM   Any Other Special Instructions Will Be Listed Below (If Applicable).     If you need a refill on your cardiac medications before your next appointment, please call your pharmacy.

## 2017-12-30 NOTE — Progress Notes (Signed)
Cardiology Office Note:    Date:  12/30/2017   ID:  Brittney Ross, DOB 1961-07-01, MRN [id number]  PCP:  Scot Jun, FNP  Cardiologist:  Mertie Moores, MD   Referring MD: Scot Jun, FNP   Chief Complaint  Patient presents with  . Follow-up    CHF    History of Present Illness:    Brittney Ross is a 57 y.o. female with chronic systolic congestive heart failure in the setting of nonischemic cardiomyopathy, mild nonobstructive coronary artery disease by cardiac catheterization in 2018, hypertension, hyperlipidemia, obesity.  Last seen by Dr. Acie Fredrickson 10/08/17.  Continued titration of heart failure medications was recommended.  Her Entresto dose was adjusted.  Ms. Essex returns for follow-up.  She denies chest pain, syncope, orthopnea, PND or significant pedal edema.  She has dyspnea with more moderate to extreme activities.  Prior CV studies:   The following studies were reviewed today:  Echo 10/06/17 Mild LVH, EF 28-78, grade 1 diastolic dysfunction no LV thrombus, trivial MR, mild LAE  R/L Cardiac Catheterization 07/09/17 LM normal LAD ok, D1 prox 40, mid 30 LCx normal RCA normal Mean RA 10, mean PAP 29, mean PCWP 24 LVEDP 29  Echocardiogram 07/06/17 EF 20-25, severe diff HK, Gr 1 DD, mild MR, mod LAE  Past Medical History:  Diagnosis Date  . Anemia   . Chronic combined systolic and diastolic heart failure (HCC) 07/05/2017   Non-ischemic CM // Echo 9/18:EF 20-25, severe diff HK, Gr 1 DD, mild MR, mod LAE // R/L Heart Cath 9/18: pD1 40, mD1 30; o/w normal cors  . Hyperlipidemia   . Hypertension    Pt states she "doesn't have HTN"  . Morbid obesity ([place])   . Thyroid disease hyperthyroidism    Past Surgical History:  Procedure Laterality Date  . CESAREAN SECTION  12/07/79  . CESAREAN SECTION  05/17/81  . CESAREAN SECTION  09/06/89  . RATH/BSO/cystoscopy    . RIGHT/LEFT HEART CATH AND CORONARY ANGIOGRAPHY N/A 07/09/2017   Procedure: RIGHT/LEFT HEART CATH  AND CORONARY ANGIOGRAPHY;  Surgeon: Troy Sine, MD;  Location: [place] [facility];  Service: Cardiovascular;  Laterality: N/A;  . TUBAL LIGATION      Current Medications: Current Meds  Medication Sig  . albuterol (PROVENTIL HFA;VENTOLIN HFA) 108 (90 Base) MCG/ACT inhaler Inhale 2 puffs every 6 (six) hours as needed into the lungs for wheezing or shortness of breath.  Marland Kitchen aspirin EC 81 MG tablet Take 81 mg by mouth daily.  Marland Kitchen atorvastatin (LIPITOR) 80 MG tablet Take 1 tablet (80 mg total) by mouth daily.  . carvedilol (COREG) 6.25 MG tablet Take 1 tablet (6.25 mg total) by mouth 2 (two) times daily with a meal. (with 3.125 mg tab to = 9.375 mg)  . furosemide (LASIX) 20 MG tablet Take 1 tablet (20 mg total) by mouth daily as needed for fluid or edema.  Marland Kitchen ipratropium (ATROVENT) 0.03 % nasal spray Place 2 sprays into both nostrils 2 (two) times daily.  Marland Kitchen levothyroxine (SYNTHROID, LEVOTHROID) 125 MCG tablet Take 1 tablet (125 mcg total) by mouth daily.  . sacubitril-valsartan (ENTRESTO) 49-51 MG Take 1 tablet by mouth 2 (two) times daily.  Marland Kitchen spironolactone (ALDACTONE) 25 MG tablet Take 0.5 tablets (12.5 mg total) by mouth daily.     Allergies:   Patient has no known allergies.   Social History   Tobacco Use  . Smoking status: Never Smoker  . Smokeless tobacco: Never Used  Substance Use  Topics  . Alcohol use: No  . Drug use: No     Family Hx: The patient's family history includes Heart disease in her mother; Hypertension in her father and sister.  ROS:   Please see the history of present illness.    ROS All other systems reviewed and are negative.   EKGs/Labs/Other Test Reviewed:    EKG:  EKG is  ordered today.  The ekg ordered today demonstrates normal sinus rhythm, heart rate 73, left axis deviation, QTC 486  Recent Labs: 07/05/2017: B Natriuretic Peptide 953.6 07/07/2017: Magnesium 2.0 07/17/2017: Hemoglobin 12.0; Platelets 185 10/29/2017: BUN 17; Creatinine, Ser 0.73;  Potassium 3.8; Sodium 145 12/22/2017: TSH 2.07 12/30/2017: ALT 16   Recent Lipid Panel Lab Results  Component Value Date/Time   CHOL 171 12/30/2017 11:16 AM   TRIG 84 12/30/2017 11:16 AM   HDL 50 12/30/2017 11:16 AM   CHOLHDL 3.4 12/30/2017 11:16 AM   CHOLHDL 7.9 07/10/2017 04:19 AM   LDLCALC 104 (H) 12/30/2017 11:16 AM    Physical Exam:    VS:  BP 128/80   Pulse 73   Ht 5\' 4"  (1.626 m)   Wt (!) 303 lb (137.4 kg)   LMP 10/28/2011   SpO2 93%   BMI 52.01 kg/m     Wt Readings from Last 3 Encounters:  12/30/17 (!) 303 lb (137.4 kg)  12/22/17 (!) 300 lb 3.2 oz (136.2 kg)  11/24/17 298 lb (135.2 kg)     Physical Exam  Constitutional: She is oriented to person, place, and time. She appears well-developed and well-nourished. No distress.  HENT:  Head: Normocephalic and atraumatic.  Neck: Neck supple.  Cardiovascular: Normal rate and regular rhythm.  No murmur heard. Pulmonary/Chest: She has no rales.  Abdominal: Soft.  Musculoskeletal: She exhibits no edema.  Neurological: She is alert and oriented to person, place, and time.  Skin: Skin is warm and dry.    ASSESSMENT & PLAN:    #1.  Chronic combined systolic and diastolic heart failure (HCC) EF 30-35.  She is NYHA 2.  Continue current dose of Entresto, spironolactone.  Heart rate still above 70.  I will adjust her beta-blocker.  -Increase carvedilol to 9.375 mg twice daily  -Follow-up 6 weeks  -Consider follow-up echo after medications maximized  #2.  Hypertensive heart disease with heart failure (HCC) Blood pressure at goal.  Medications are adjusted for congestive heart failure.  #3.  Coronary artery disease Mild nonobstructive coronary disease by cardiac catheterization in 2018.  She is not having anginal symptoms.  Continue aspirin, statin.  Check fasting Lipids and LFTs today.   Dispo:  Return in about 6 weeks (around 02/10/2018) for Routine Follow Up, w/ Richardson Dopp, PA-C.   Medication Adjustments/Labs and  Tests Ordered: Current medicines are reviewed at length with the patient today.  Concerns regarding medicines are outlined above.  Tests Ordered: Orders Placed This Encounter  Procedures  . Lipid panel  . Hepatic function panel  . EKG 12-Lead   Medication Changes: Meds ordered this encounter  Medications  . carvedilol (COREG) 6.25 MG tablet    Sig: Take 1 tablet (6.25 mg total) by mouth 2 (two) times daily with a meal. (with 3.125 mg tab to = 9.375 mg)    Dispense:  180 tablet    Refill:  3    Order Specific Question:   Supervising Provider    Answer:   Lauree Chandler D [[id number]]  . carvedilol (COREG) 3.125 MG tablet  Sig: Take 1 tablet (3.125 mg total) by mouth 2 (two) times daily with a meal. (with 6.25 mg tab to = 9.375 mg)    Dispense:  180 tablet    Refill:  3    Order Specific Question:   Supervising Provider    Answer:   Lauree Chandler D [[id number]]    Signed, Richardson Dopp, PA-C  12/30/2017 4:22 PM    [facility] [place], [place], [place]  [address] Phone: [phone]; Fax: [phone]

## 2018-01-05 ENCOUNTER — Telehealth: Payer: Self-pay

## 2018-01-25 ENCOUNTER — Other Ambulatory Visit: Payer: Self-pay | Admitting: Family Medicine

## 2018-02-12 ENCOUNTER — Ambulatory Visit: Payer: BLUE CROSS/BLUE SHIELD | Admitting: Physician Assistant

## 2018-02-12 ENCOUNTER — Encounter: Payer: Self-pay | Admitting: Physician Assistant

## 2018-02-12 VITALS — BP 130/84 | HR 71 | Ht 65.0 in | Wt 298.6 lb

## 2018-02-12 DIAGNOSIS — I5042 Chronic combined systolic (congestive) and diastolic (congestive) heart failure: Secondary | ICD-10-CM | POA: Diagnosis not present

## 2018-02-12 MED ORDER — CARVEDILOL 12.5 MG PO TABS: 12.5000 mg | ORAL_TABLET | Freq: Two times a day (BID) | ORAL | 3 refills | Status: DC

## 2018-02-12 NOTE — Patient Instructions (Signed)
Medication Instructions: Your physician has recommended you make the following change in your medication:  INCREASE Carvedilol to 12.5 taking one tablet twice a day ( you can take 2 of the 6.25 twice a day to use them up)   Labwork: None Ordrered  Procedures/Testing: Your physician has requested that you have an echocardiogram 1 week before you see Dr.Nahser in 3 months . Echocardiography is a painless test that uses sound waves to create images of your heart. It provides your doctor with information about the size and shape of your heart and how well your heart's chambers and valves are working. This procedure takes approximately one hour. There are no restrictions for this procedure.    Follow-Up: Your physician recommends that you schedule a follow-up appointment in: 3 Months with Dr.Nahser   Any Additional Special Instructions Will Be Listed Below (If Applicable).  Echocardiogram An echocardiogram, or echocardiography, uses sound waves (ultrasound) to produce an image of your heart. The echocardiogram is simple, painless, obtained within a short period of time, and offers valuable information to your health care provider. The images from an echocardiogram can provide information such as:  Evidence of coronary artery disease (CAD).  Heart size.  Heart muscle function.  Heart valve function.  Aneurysm detection.  Evidence of a past heart attack.  Fluid buildup around the heart.  Heart muscle thickening.  Assess heart valve function.  Tell a health care provider about:  Any allergies you have.  All medicines you are taking, including vitamins, herbs, eye drops, creams, and over-the-counter medicines.  Any problems you or family members have had with anesthetic medicines.  Any blood disorders you have.  Any surgeries you have had.  Any medical conditions you have.  Whether you are pregnant or may be pregnant. What happens before the procedure? No special  preparation is needed. Eat and drink normally. What happens during the procedure?  In order to produce an image of your heart, gel will be applied to your chest and a wand-like tool (transducer) will be moved over your chest. The gel will help transmit the sound waves from the transducer. The sound waves will harmlessly bounce off your heart to allow the heart images to be captured in real-time motion. These images will then be recorded.  You may need an IV to receive a medicine that improves the quality of the pictures. What happens after the procedure? You may return to your normal schedule including diet, activities, and medicines, unless your health care provider tells you otherwise. This information is not intended to replace advice given to you by your health care provider. Make sure you discuss any questions you have with your health care provider. Document Released: 10/03/2000 Document Revised: 05/24/2016 Document Reviewed: 06/13/2013 Elsevier Interactive Patient Education  2017 [employer].     If you need a refill on your cardiac medications before your next appointment, please call your pharmacy.

## 2018-02-12 NOTE — Progress Notes (Signed)
Cardiology Office Note:    Date:  02/12/2018   ID:  [name], DOB 12-26-1960, MRN [id number]  PCP:  Scot Jun, FNP  Cardiologist:  Mertie Moores, MD   Referring MD: Scot Jun, FNP   Chief Complaint  Patient presents with  . Follow-up    CHF    History of Present Illness:    Brittney Ross is a 57 y.o. female with chronic systolic congestive heart failure in the setting of nonischemic cardiomyopathy, mild nonobstructive coronary artery disease by cardiac catheterization in 2018, hypertension, hyperlipidemia, obesity.  Last seen 12/30/2017.  Brittney Ross returns for follow-up on congestive heart failure.  At last visit, her carvedilol dose was increased.  She is here today with her husband.  Since last seen, she has been doing well.  She denies chest pain, shortness of breath, syncope, orthopnea, PND or significant pedal edema.   Prior CV studies:   The following studies were reviewed today:  Echo 10/06/17 Mild LVH, EF 95-18, grade 1 diastolic dysfunction no LV thrombus, trivial MR, mild LAE  R/L Cardiac Catheterization 07/09/17 LM normal LAD ok, D1 prox 40, mid 30 LCx normal RCA normal Mean RA 10, mean PAP 29, mean PCWP 24 LVEDP 29  Echocardiogram 07/06/17 EF 20-25, severe diff HK, Gr 1 DD, mild MR, mod LAE  Past Medical History:  Diagnosis Date  . Anemia   . Chronic combined systolic and diastolic heart failure (HCC) 07/05/2017   Non-ischemic CM // Echo 9/18:EF 20-25, severe diff HK, Gr 1 DD, mild MR, mod LAE // R/L Heart Cath 9/18: pD1 40, mD1 30; o/w normal cors  . Hyperlipidemia   . Hypertension    Pt states she "doesn't have HTN"  . Morbid obesity ([place])   . Thyroid disease hyperthyroidism   Surgical Hx: The patient  has a past surgical history that includes Cesarean section (12/07/79); Cesarean section (05/17/81); Cesarean section (09/06/89); Tubal ligation; RATH/BSO/cystoscopy; and RIGHT/LEFT HEART CATH AND CORONARY ANGIOGRAPHY (N/A, 07/09/2017).     Current Medications: Current Meds  Medication Sig  . albuterol (PROVENTIL HFA;VENTOLIN HFA) 108 (90 Base) MCG/ACT inhaler Inhale 2 puffs every 6 (six) hours as needed into the lungs for wheezing or shortness of breath.  Marland Kitchen aspirin EC 81 MG tablet Take 81 mg by mouth daily.  Marland Kitchen atorvastatin (LIPITOR) 80 MG tablet TAKE 1 TABLET BY MOUTH ONCE DAILY  . furosemide (LASIX) 20 MG tablet Take 1 tablet (20 mg total) by mouth daily as needed for fluid or edema.  Marland Kitchen levothyroxine (SYNTHROID, LEVOTHROID) 125 MCG tablet Take 1 tablet (125 mcg total) by mouth daily.  . sacubitril-valsartan (ENTRESTO) 49-51 MG Take 1 tablet by mouth 2 (two) times daily.  Marland Kitchen spironolactone (ALDACTONE) 25 MG tablet Take 0.5 tablets (12.5 mg total) by mouth daily.  . [DISCONTINUED] carvedilol (COREG) 3.125 MG tablet Take 1 tablet (3.125 mg total) by mouth 2 (two) times daily with a meal. (with 6.25 mg tab to = 9.375 mg)  . [DISCONTINUED] carvedilol (COREG) 6.25 MG tablet Take 1 tablet (6.25 mg total) by mouth 2 (two) times daily with a meal. (with 3.125 mg tab to = 9.375 mg)     Allergies:   Patient has no known allergies.   Social History   Tobacco Use  . Smoking status: Never Smoker  . Smokeless tobacco: Never Used  Substance Use Topics  . Alcohol use: No  . Drug use: No     Family Hx: The patient's family history includes Heart  disease in her mother; Hypertension in her father and sister.  ROS:   Please see the history of present illness.    ROS All other systems reviewed and are negative.   EKGs/Labs/Other Test Reviewed:    EKG:  EKG is not ordered today.    Recent Labs: 07/05/2017: B Natriuretic Peptide 953.6 07/07/2017: Magnesium 2.0 07/17/2017: Hemoglobin 12.0; Platelets 185 10/29/2017: BUN 17; Creatinine, Ser 0.73; Potassium 3.8; Sodium 145 12/22/2017: TSH 2.07 12/30/2017: ALT 16   Recent Lipid Panel Lab Results  Component Value Date/Time   CHOL 171 12/30/2017 11:16 AM   TRIG 84 12/30/2017 11:16 AM    HDL 50 12/30/2017 11:16 AM   CHOLHDL 3.4 12/30/2017 11:16 AM   CHOLHDL 7.9 07/10/2017 04:19 AM   LDLCALC 104 (H) 12/30/2017 11:16 AM    Physical Exam:    VS:  BP 130/84   Pulse 71   Ht 5\' 5"  (1.651 m)   Wt 298 lb 9.6 oz (135.4 kg)   LMP 10/28/2011   SpO2 99%   BMI 49.69 kg/m     Wt Readings from Last 3 Encounters:  02/12/18 298 lb 9.6 oz (135.4 kg)  12/30/17 (!) 303 lb (137.4 kg)  12/22/17 (!) 300 lb 3.2 oz (136.2 kg)     Physical Exam  Constitutional: She is oriented to person, place, and time. She appears well-developed and well-nourished. No distress.  HENT:  Head: Normocephalic and atraumatic.  Neck: Neck supple. No JVD present.  Cardiovascular: Normal rate, regular rhythm, S1 normal, S2 normal and normal heart sounds.  No murmur heard. Pulmonary/Chest: Effort normal. She has no rales.  Abdominal: Soft. There is no hepatomegaly.  Musculoskeletal: She exhibits no edema.  Neurological: She is alert and oriented to person, place, and time.  Skin: Skin is warm and dry.    ASSESSMENT & PLAN:    #1.  Chronic combined systolic and diastolic heart failure (HCC) EF 30-35.  She is NYHA 2.  Volume status is stable.  Her blood pressure and heart rate can tolerate further titration of beta-blocker.  Continue current dose of Entresto, spironolactone.  Increase carvedilol to 12.5 mg twice daily.  Follow-up in 3 months with a limited echo obtained 1 week prior to reassess her ejection fraction.   Dispo:  Return in about 3 months (around 05/14/2018) for Routine Follow Up, w/ Dr. Acie Fredrickson.   Medication Adjustments/Labs and Tests Ordered: Current medicines are reviewed at length with the patient today.  Concerns regarding medicines are outlined above.  Tests Ordered: Orders Placed This Encounter  Procedures  . ECHOCARDIOGRAM LIMITED   Medication Changes: Meds ordered this encounter  Medications  . carvedilol (COREG) 12.5 MG tablet    Sig: Take 1 tablet (12.5 mg total) by  mouth 2 (two) times daily.    Dispense:  180 tablet    Refill:  3    Signed, Richardson Dopp, PA-C  02/12/2018 1:53 PM    [facility] [address], [place], [place]  [address] Phone: [phone]; Fax: [phone]

## 2018-02-15 ENCOUNTER — Other Ambulatory Visit: Payer: Self-pay | Admitting: Cardiovascular Disease

## 2018-02-25 ENCOUNTER — Ambulatory Visit (HOSPITAL_COMMUNITY): Payer: BLUE CROSS/BLUE SHIELD | Attending: Internal Medicine

## 2018-02-25 ENCOUNTER — Other Ambulatory Visit: Payer: Self-pay

## 2018-02-25 DIAGNOSIS — I420 Dilated cardiomyopathy: Secondary | ICD-10-CM | POA: Insufficient documentation

## 2018-02-25 DIAGNOSIS — E785 Hyperlipidemia, unspecified: Secondary | ICD-10-CM | POA: Insufficient documentation

## 2018-02-25 DIAGNOSIS — I5042 Chronic combined systolic (congestive) and diastolic (congestive) heart failure: Secondary | ICD-10-CM | POA: Insufficient documentation

## 2018-02-25 DIAGNOSIS — I2510 Atherosclerotic heart disease of native coronary artery without angina pectoris: Secondary | ICD-10-CM | POA: Insufficient documentation

## 2018-02-25 DIAGNOSIS — Z6841 Body Mass Index (BMI) 40.0 and over, adult: Secondary | ICD-10-CM | POA: Insufficient documentation

## 2018-02-25 DIAGNOSIS — I110 Hypertensive heart disease with heart failure: Secondary | ICD-10-CM | POA: Insufficient documentation

## 2018-02-25 DIAGNOSIS — E669 Obesity, unspecified: Secondary | ICD-10-CM | POA: Diagnosis not present

## 2018-02-25 MED ORDER — PERFLUTREN LIPID MICROSPHERE
1.0000 mL | INTRAVENOUS | Status: AC | PRN
  Administered 2018-02-25: 2 mL via INTRAVENOUS

## 2018-03-03 ENCOUNTER — Encounter: Payer: Self-pay | Admitting: Physician Assistant

## 2018-03-16 ENCOUNTER — Ambulatory Visit: Payer: BLUE CROSS/BLUE SHIELD | Admitting: Family Medicine

## 2018-03-24 ENCOUNTER — Ambulatory Visit: Payer: BLUE CROSS/BLUE SHIELD | Admitting: Family Medicine

## 2018-03-24 ENCOUNTER — Encounter: Payer: Self-pay | Admitting: Family Medicine

## 2018-03-24 VITALS — BP 142/66 | HR 98 | Temp 98.4°F | Ht 65.0 in | Wt 301.0 lb

## 2018-03-24 DIAGNOSIS — I10 Essential (primary) hypertension: Secondary | ICD-10-CM

## 2018-03-24 DIAGNOSIS — Z1231 Encounter for screening mammogram for malignant neoplasm of breast: Secondary | ICD-10-CM

## 2018-03-24 DIAGNOSIS — Z09 Encounter for follow-up examination after completed treatment for conditions other than malignant neoplasm: Secondary | ICD-10-CM

## 2018-03-24 DIAGNOSIS — R197 Diarrhea, unspecified: Secondary | ICD-10-CM | POA: Diagnosis not present

## 2018-03-24 LAB — POCT URINALYSIS DIP (MANUAL ENTRY)
Bilirubin, UA: NEGATIVE
Blood, UA: NEGATIVE
Glucose, UA: NEGATIVE mg/dL
Ketones, POC UA: NEGATIVE mg/dL
Leukocytes, UA: NEGATIVE
Nitrite, UA: NEGATIVE
Protein Ur, POC: 30 mg/dL — AB
Spec Grav, UA: 1.025 (ref 1.010–1.025)
Urobilinogen, UA: 1 E.U./dL
pH, UA: 5.5 (ref 5.0–8.0)

## 2018-03-24 NOTE — Progress Notes (Signed)
Subjective:    Patient ID: Brittney Ross, female    DOB: 1961-04-16, 57 y.o.   MRN: [id number]   PCP: Kathe Becton, NP  Chief Complaint  Patient presents with  . Follow-up    6 month on chronic condition   HPI  Brittney Ross has a history of Thyroid Disease, Morbid Obesity, Hypertension, Hyperlipidemia, and Anemia.   Current Status: She is doing well. She has Chronic Knee pain, which she gets Cortisone Injections. She last received cortisone injection 06/2017, but she has appointment to receive injection today at [facility] in [place].   She denies any signs and symptoms of infection. She denies visual changes, headaches, dizziness, and falls. Denies cough, chest pain, heart palpitations, and shortness of breath.   She has had 5 incidents of diarrhea since last night, which she uses Imodium. She denies abdominal pain, nausea, vomiting, blood in stools, dysuria, and hematuria.   Past Medical History:  Diagnosis Date  . Anemia   . Chronic combined systolic and diastolic heart failure (HCC) 07/05/2017   Non-ischemic CM // Echo 9/18:EF 20-25, severe diff HK, Gr 1 DD, mild MR, mod LAE // R/L Heart Cath 9/18: pD1 40, mD1 30; o/w normal cors // Echo 5/19: EF 35-40, trivial AI, mildly dilated aortic root (38 mm), MAC, mild LAE   . Hyperlipidemia   . Hypertension    Pt states she "doesn't have HTN"  . Morbid obesity ([place])   . Thyroid disease hyperthyroidism    Family History  Problem Relation Age of Onset  . Hypertension Father   . Heart disease Mother   . Hypertension Sister     Social History   Socioeconomic History  . Marital status: Married    Spouse name: Not on file  . Number of children: Not on file  . Years of education: Not on file  . Highest education level: Not on file  Occupational History  . Not on file  Social Needs  . Financial resource strain: Not on file  . Food insecurity:    Worry: Not on file    Inability: Not on file  . Transportation  needs:    Medical: Not on file    Non-medical: Not on file  Tobacco Use  . Smoking status: Never Smoker  . Smokeless tobacco: Never Used  Substance and Sexual Activity  . Alcohol use: No  . Drug use: No  . Sexual activity: Yes    Birth control/protection: Surgical  Lifestyle  . Physical activity:    Days per week: Not on file    Minutes per session: Not on file  . Stress: Not on file  Relationships  . Social connections:    Talks on phone: Not on file    Gets together: Not on file    Attends religious service: Not on file    Active member of club or organization: Not on file    Attends meetings of clubs or organizations: Not on file    Relationship status: Not on file  . Intimate partner violence:    Fear of current or ex partner: Not on file    Emotionally abused: Not on file    Physically abused: Not on file    Forced sexual activity: Not on file  Other Topics Concern  . Not on file  Social History Narrative  . Not on file    Past Surgical History:  Procedure Laterality Date  . CESAREAN SECTION  12/07/79  . CESAREAN SECTION  05/17/81  . CESAREAN SECTION  09/06/89  . RATH/BSO/cystoscopy    . RIGHT/LEFT HEART CATH AND CORONARY ANGIOGRAPHY N/A 07/09/2017   Procedure: RIGHT/LEFT HEART CATH AND CORONARY ANGIOGRAPHY;  Surgeon: Troy Sine, MD;  Location: [facility];  Service: Cardiovascular;  Laterality: N/A;  . TUBAL LIGATION      Immunization History  Administered Date(s) Administered  . Pneumococcal Polysaccharide-23 07/07/2017    Current Meds  Medication Sig  . albuterol (PROVENTIL HFA;VENTOLIN HFA) 108 (90 Base) MCG/ACT inhaler Inhale 2 puffs every 6 (six) hours as needed into the lungs for wheezing or shortness of breath.  Marland Kitchen aspirin EC 81 MG tablet Take 81 mg by mouth daily.  Marland Kitchen atorvastatin (LIPITOR) 80 MG tablet TAKE 1 TABLET BY MOUTH ONCE DAILY  . carvedilol (COREG) 12.5 MG tablet Take 1 tablet (12.5 mg total) by mouth 2 (two) times daily.  Marland Kitchen  ENTRESTO 49-51 MG TAKE 1 TABLET BY MOUTH TWICE DAILY  . furosemide (LASIX) 20 MG tablet Take 1 tablet (20 mg total) by mouth daily as needed for fluid or edema.  Marland Kitchen levothyroxine (SYNTHROID, LEVOTHROID) 125 MCG tablet Take 1 tablet (125 mcg total) by mouth daily.  Marland Kitchen spironolactone (ALDACTONE) 25 MG tablet Take 0.5 tablets (12.5 mg total) by mouth daily.    No Known Allergies  BP (!) 142/66 (BP Location: Right Arm, Patient Position: Sitting, Cuff Size: Large)   Pulse 98   Temp 98.4 F (36.9 C) (Oral)   Ht _0  (1.651 m)   Wt (!) 301 lb (136.5 kg)   LMP 10/28/2011   SpO2 97%   BMI 50.09 kg/m      Review of Systems  Constitutional: Negative.   HENT: Negative.   Eyes: Negative.   Respiratory: Negative.   Cardiovascular: Negative.   Gastrointestinal: Negative.   Endocrine: Negative.   Genitourinary: Negative.   Musculoskeletal: Negative.   Skin: Negative.   Allergic/Immunologic: Negative.   Neurological: Negative.   Hematological: Negative.   Psychiatric/Behavioral: Negative.    Objective:   Physical Exam  Constitutional: She is oriented to person, place, and time. She appears well-developed.  HENT:  Head: Normocephalic and atraumatic.  Right Ear: External ear normal.  Left Ear: External ear normal.  Nose: Nose normal.  Mouth/Throat: Oropharynx is clear and moist.  Eyes: Pupils are equal, round, and reactive to light. Conjunctivae and EOM are normal.  Neck: Normal range of motion. Neck supple.  Cardiovascular: Normal rate, regular rhythm, normal heart sounds and intact distal pulses.  Pulmonary/Chest: Effort normal and breath sounds normal.  Abdominal: Soft. Bowel sounds are normal.  Musculoskeletal:  Limited ROM in knees.   Neurological: She is alert and oriented to person, place, and time.  Skin: Skin is warm and dry.  Nursing note and vitals reviewed.  Assessment & Plan:   1. Hypertension, unspecified type Blood pressure is stable at 142/66 today. She has a  good response to current anti-hypertensive medications. She will continue Carvedilol and Lasix as directed.  - POCT urinalysis dipstick  2. Encounter for screening mammogram for breast cancer - MM Digital Screening; Future  3. Diarrhea, unspecified type Continue Imodium as directed. She is also advised to use Pepto Bismol as needed.   4. Follow up She will follow up in 4 months for follow up and Pap Smear.    No orders of the defined types were placed in this encounter.  Kathe Becton,  MSN, FNP-BC Patient [place] [address]  [place], [place] [address] [phone]

## 2018-03-27 ENCOUNTER — Encounter: Payer: Self-pay | Admitting: Family Medicine

## 2018-03-27 NOTE — Patient Instructions (Signed)
Patient advised to use Pepto Bismol if needed.

## 2018-03-29 ENCOUNTER — Telehealth: Payer: Self-pay

## 2018-03-29 NOTE — Telephone Encounter (Signed)
Left a detailed vm for patient.

## 2018-03-29 NOTE — Telephone Encounter (Signed)
-----   Message from Azzie Glatter, [place] sent at 03/27/2018 11:50 PM EDT ----- Regarding: "Lab Results" Please call:  Her lab results are stable.  She will continue Imodium as needed. Please tell her that she can also take Pepto Bismol if Imodium is not effective in controlling diarrhea.

## 2018-04-20 ENCOUNTER — Ambulatory Visit: Payer: BLUE CROSS/BLUE SHIELD

## 2018-05-12 ENCOUNTER — Other Ambulatory Visit: Payer: BLUE CROSS/BLUE SHIELD

## 2018-05-19 ENCOUNTER — Ambulatory Visit: Payer: BLUE CROSS/BLUE SHIELD | Admitting: Cardiovascular Disease

## 2018-05-21 ENCOUNTER — Other Ambulatory Visit: Payer: Self-pay | Admitting: Family Medicine

## 2018-05-23 ENCOUNTER — Encounter (HOSPITAL_COMMUNITY): Payer: Self-pay | Admitting: Emergency Medicine

## 2018-05-23 ENCOUNTER — Emergency Department (HOSPITAL_COMMUNITY)
Admission: EM | Admit: 2018-05-23 | Discharge: 2018-05-23 | Disposition: A | Discharge status: Home / Self Care | Payer: BLUE CROSS/BLUE SHIELD | Attending: Emergency Medicine | Admitting: Emergency Medicine

## 2018-05-23 ENCOUNTER — Other Ambulatory Visit: Payer: Self-pay

## 2018-05-23 DIAGNOSIS — Z79899 Other long term (current) drug therapy: Secondary | ICD-10-CM | POA: Diagnosis not present

## 2018-05-23 DIAGNOSIS — I2510 Atherosclerotic heart disease of native coronary artery without angina pectoris: Secondary | ICD-10-CM | POA: Diagnosis not present

## 2018-05-23 DIAGNOSIS — M79651 Pain in right thigh: Secondary | ICD-10-CM | POA: Insufficient documentation

## 2018-05-23 DIAGNOSIS — Z7982 Long term (current) use of aspirin: Secondary | ICD-10-CM | POA: Insufficient documentation

## 2018-05-23 DIAGNOSIS — I110 Hypertensive heart disease with heart failure: Secondary | ICD-10-CM | POA: Insufficient documentation

## 2018-05-23 DIAGNOSIS — I5042 Chronic combined systolic (congestive) and diastolic (congestive) heart failure: Secondary | ICD-10-CM | POA: Insufficient documentation

## 2018-05-23 DIAGNOSIS — E039 Hypothyroidism, unspecified: Secondary | ICD-10-CM | POA: Diagnosis not present

## 2018-05-23 DIAGNOSIS — M79604 Pain in right leg: Secondary | ICD-10-CM

## 2018-05-23 MED ORDER — CYCLOBENZAPRINE HCL 10 MG PO TABS: 5.0000 mg | ORAL_TABLET | Freq: Two times a day (BID) | ORAL | 0 refills | Status: DC | PRN

## 2018-05-23 NOTE — Discharge Instructions (Signed)
You most likely have pulled a muscle. Please follow up with you primary care provider within the week. If you develop swelling, redness, warmth or uncontrolled pain or any new or concerning symptoms please return to the ED. Do not drive or operate heavy machinery with the medication prescribed. May use ice on the affected area and Tylenol as prescribed for additional pain.  Contact a health care provider if: You have increasing pain or swelling in the injured area. You have numbness, tingling, or a significant loss of strength in the injured area.

## 2018-05-23 NOTE — ED Triage Notes (Signed)
Pt. Stated, Ive had rt. Thigh leg pain for a week. Yesterday it got worse.

## 2018-05-23 NOTE — ED Provider Notes (Signed)
[facility] EMERGENCY DEPARTMENT Provider Note   CSN: [id number] Arrival date & time: 05/23/18  2500     History   Chief Complaint Chief Complaint  Patient presents with  . Leg Pain    HPI Brittney Ross is a 57 y.o. female who presents to the ED with a 2 day history of inner thigh pain. States when she got out of the bed yesterday to go to the restroom in a rush she moved her leg out of the bed quickly and felt a pull in her inner thigh. Since the incident she has had constant pain in the inner thigh. Rates her pain a 6/10 and describes it as a constant ache. Denies swelling, redness, warmth, tingling, numbness or fever. Denies history of previous thigh pain prior to the incident. Denies history of blood clots, recent surgery or immobility, travel, use of hormonal therapy or active cancer. Has tried to place ice to the area without relief of symptoms. Walking and abducting the leg makes the pain worse.  Pain resolves with rest.    Past Medical History:  Diagnosis Date  . Anemia   . Chronic combined systolic and diastolic heart failure (HCC) 07/05/2017   Non-ischemic CM // Echo 9/18:EF 20-25, severe diff HK, Gr 1 DD, mild MR, mod LAE // R/L Heart Cath 9/18: pD1 40, mD1 30; o/w normal cors // Echo 5/19: EF 35-40, trivial AI, mildly dilated aortic root (38 mm), MAC, mild LAE   . Hyperlipidemia   . Hypertension    Pt states she "doesn't have HTN"  . Morbid obesity ([place])   . Thyroid disease hyperthyroidism    Patient Active Problem List   Diagnosis Date Noted  . Coronary artery disease involving native coronary artery of native heart without angina pectoris 12/30/2017  . HTN (hypertension) 09/14/2017  . Dilated cardiomyopathy ([place])   . Hypokalemia 07/05/2017  . Chronic combined systolic and diastolic heart failure ([place]) 07/05/2017  . Acute pulmonary edema (HCC)   . Chronic female pelvic pain 08/14/2011  . H/O Graves' disease 03/20/2010  . 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 Obesity 03/20/2008  . Hypertensive heart disease with heart failure ([place]) 03/20/2008  . HYPERCHOLESTEROLEMIA 02/22/2008  . MIGRAINE HEADACHE 02/21/2008  . KNEE PAIN, RIGHT 02/21/2008  . FIBROIDS, UTERUS 02/06/2008  . ANEMIA 02/06/2008    Past Surgical History:  Procedure Laterality Date  . CESAREAN SECTION  12/07/79  . CESAREAN SECTION  05/17/81  . CESAREAN SECTION  09/06/89  . RATH/BSO/cystoscopy    . RIGHT/LEFT HEART CATH AND CORONARY ANGIOGRAPHY N/A 07/09/2017   Procedure: RIGHT/LEFT HEART CATH AND CORONARY ANGIOGRAPHY;  Surgeon: Troy Sine, MD;  Location: [facility];  Service: Cardiovascular;  Laterality: N/A;  . TUBAL LIGATION       OB History    Gravida  3   Para  3   Term  3   Preterm  0   AB  0   Living  3     SAB  0   TAB  0   Ectopic  0   Multiple  0   Live Births  3            Home Medications    Prior to Admission medications   Medication Sig Start Date End Date Taking? Authorizing Provider  albuterol (PROVENTIL HFA;VENTOLIN HFA) 108 (90 Base) MCG/ACT inhaler Inhale 2 puffs every 6 (six) hours as needed into the lungs for wheezing or shortness of breath. 08/25/17  Scot Jun, FNP  aspirin EC 81 MG tablet Take 81 mg by mouth daily.    [provider]  atorvastatin (LIPITOR) 80 MG tablet TAKE 1 TABLET BY MOUTH ONCE DAILY 01/25/18   Scot Jun, FNP  carvedilol (COREG) 12.5 MG tablet Take 1 tablet (12.5 mg total) by mouth 2 (two) times daily. 02/12/18 05/13/18  Richardson Dopp T, PA-C  cyclobenzaprine (FLEXERIL) 10 MG tablet Take 0.5 tablets (5 mg total) by mouth 2 (two) times daily as needed for muscle spasms. 05/23/18   Dayona Shaheen A, PA-C  ENTRESTO 49-51 MG TAKE 1 TABLET BY MOUTH TWICE DAILY 02/17/18   Nahser, Wonda Cheng, MD  furosemide (LASIX) 20 MG tablet Take 1 tablet (20 mg total) by mouth daily as needed for fluid or edema. 08/18/17   Richardson Dopp T, PA-C  levothyroxine (SYNTHROID,  LEVOTHROID) 125 MCG tablet Take 1 tablet (125 mcg total) by mouth daily. 12/22/17   Philemon Kingdom, MD  spironolactone (ALDACTONE) 25 MG tablet Take 0.5 tablets (12.5 mg total) by mouth daily. 08/18/17 08/13/18  Liliane Shi, PA-C    Family History Family History  Problem Relation Age of Onset  . Hypertension Father   . Heart disease Mother   . Hypertension Sister     Social History Social History   Tobacco Use  . Smoking status: Never Smoker  . Smokeless tobacco: Never Used  Substance Use Topics  . Alcohol use: No  . Drug use: No     Allergies   Patient has no known allergies.   Review of Systems Review of Systems  Constitutional: Negative for fever.  Musculoskeletal: Negative for back pain, gait problem, joint swelling, neck stiffness and stiffness.       Right medial thigh pain. Denies swelling, redness, tingling or warmth to the right extremity  Skin: Negative for color change, itching, pallor and rash.  Neurological: Negative for tingling, weakness and numbness.  All other systems reviewed and are negative.    Physical Exam Updated Vital Signs BP (!) 179/77 (BP Location: Right Arm)   Pulse 73   Temp 98.5 F (36.9 C) (Oral)   Resp 16   Ht 5' 4"  (1.626 m)   Wt (!) 138.3 kg (305 lb)   LMP 10/28/2011   SpO2 95%   BMI 52.35 kg/m   Physical Exam  Constitutional: She is oriented to person, place, and time. She appears well-developed and well-nourished. No distress.  HENT:  Head: Normocephalic and atraumatic.  Eyes: Conjunctivae are normal.  Neck: Normal range of motion.  Cardiovascular: Normal rate, regular rhythm and normal heart sounds.  Pulses:      Posterior tibial pulses are 3+ on the right side, and 3+ on the left side.  Pulmonary/Chest: Effort normal and breath sounds normal.  Abdominal: She exhibits no distension.  Musculoskeletal: She exhibits no edema or deformity.       Right hip: She exhibits decreased range of motion. She exhibits normal  strength, no bony tenderness, no swelling, no crepitus, no deformity and no laceration.       Left hip: Normal.       Lumbar back: Normal.       Right upper leg: She exhibits no tenderness, no bony tenderness, no swelling, no edema, no deformity and no laceration.  Decreased ROM with abduction of right leg secondary to patient pain. No tenderness to palpation of the right hip. Mild tenderness to palpation of the proximal medial right thigh.  Neurological: She is alert  and oriented to person, place, and time. She displays no atrophy. No sensory deficit. Coordination and gait normal.  Skin: Skin is warm and dry. No erythema. No pallor.  Psychiatric: She has a normal mood and affect.     ED Treatments / Results  Labs (all labs ordered are listed, but only abnormal results are displayed) Labs Reviewed - No data to display  EKG None  Radiology No results found.  Procedures Procedures (including critical care time)  Medications Ordered in ED Medications - No data to display   Initial Impression / Assessment and Plan / ED Course  I have reviewed the triage vital signs and the nursing notes.  Pertinent labs & imaging results that were available during my care of the patient were reviewed by me and considered in my medical decision making (see chart for details).  57 year old female presents to the ED with a 2-day history of medial right thigh pain.  States she felt this pain when she was getting up to go to the bathroom in the middle the night in a rush and felt a twinge and pull to the medial portion of her right thigh.  Denies previous history of any injuries to this leg.  Denies history of recent surgery, previous clots, any estrogen use, fever or chills.  I feel at this point given her symptom onset after she abducted her leg in the middle of the night are most likely musculoskeletal in nature. Given patients symptoms, history and physical exam I have low suspicion DVT given lack of  swelling, redness, warmth to the thigh as well as no previous clot history, use of hormonal therapy, recent surgery or immobility, travel or active cancer diagnosis.  Discussed with patient the nature of this injury and prescribed a low-dose muscle relaxant as well. Discussed with patient side effects of the above medicine as well as alternating ice and heat to the affected area and instructed patient to follow-up with primary care. Discussed with patient and husband return precautions and they voiced understanding.     Final Clinical Impressions(s) / ED Diagnoses   Final diagnoses:  Right leg pain    ED Discharge Orders        Ordered    cyclobenzaprine (FLEXERIL) 10 MG tablet  2 times daily PRN     05/23/18 0917       Cimone Fahey A, PA-C 05/23/18 1029    Quintella Reichert, MD 05/23/18 1624

## 2018-06-25 ENCOUNTER — Ambulatory Visit: Payer: BLUE CROSS/BLUE SHIELD | Admitting: Physician Assistant

## 2018-06-25 ENCOUNTER — Encounter: Payer: Self-pay | Admitting: Physician Assistant

## 2018-06-25 VITALS — BP 120/74 | HR 72 | Ht 64.0 in | Wt 301.8 lb

## 2018-06-25 DIAGNOSIS — I5042 Chronic combined systolic (congestive) and diastolic (congestive) heart failure: Secondary | ICD-10-CM | POA: Diagnosis not present

## 2018-06-25 DIAGNOSIS — I493 Ventricular premature depolarization: Secondary | ICD-10-CM | POA: Diagnosis not present

## 2018-06-25 DIAGNOSIS — I110 Hypertensive heart disease with heart failure: Secondary | ICD-10-CM | POA: Diagnosis not present

## 2018-06-25 DIAGNOSIS — I2510 Atherosclerotic heart disease of native coronary artery without angina pectoris: Secondary | ICD-10-CM | POA: Diagnosis not present

## 2018-06-25 NOTE — Progress Notes (Signed)
Cardiology Office Note:    Date:  06/25/2018   ID:  Brittney Ross, DOB 1961/07/01, MRN [id number]  PCP:  Azzie Glatter, FNP  Cardiologist:  Mertie Moores, MD   Referring MD: Scot Jun, FNP   Chief Complaint  Patient presents with  . Follow-up    CHF    History of Present Illness:    Brittney Ross is a 57 y.o. female with chronic systolic congestive heart failure in the setting of nonischemic cardiomyopathy, mild nonobstructive coronary artery disease by cardiac catheterization in 2018, hypertension, hyperlipidemia, obesity.    Brittney Ross returns for follow-up.  Since last seen, she had a follow-up echocardiogram in May 2019.  This demonstrated improved LV function with an EF of 35-40%.  Today, she denies chest discomfort, syncope, near syncope, orthopnea, PND or significant lower extremity swelling.  She does note shortness of breath and more extreme activities.  Prior CV studies:   The following studies were reviewed today:  Echo 02/25/18 EF 35-40, trivial AI, mildly dilated aortic root (38 mm), MAC, mild LAE  Echo 10/06/17 Mild LVH, EF 62-94, grade 1 diastolic dysfunction no LV thrombus, trivial MR, mild LAE  R/L Cardiac Catheterization 07/09/17 LM normal LAD ok, D1 prox 40, mid 30 LCx normal RCA normal Mean RA 10, mean PAP 29, mean PCWP 24 LVEDP 29  Echocardiogram 07/06/17 EF 20-25, severe diff HK, Gr 1 DD, mild MR, mod LAE  Past Medical History:  Diagnosis Date  . Anemia   . Chronic combined systolic and diastolic heart failure (HCC) 07/05/2017   Non-ischemic CM // Echo 9/18:EF 20-25, severe diff HK, Gr 1 DD, mild MR, mod LAE // R/L Heart Cath 9/18: pD1 40, mD1 30; o/w normal cors // Echo 5/19: EF 35-40, trivial AI, mildly dilated aortic root (38 mm), MAC, mild LAE   . Hyperlipidemia   . Hypertension    Pt states she "doesn't have HTN"  . Morbid obesity ([place])   . Thyroid disease hyperthyroidism   Surgical Hx: The patient  has a past surgical history  that includes Cesarean section (12/07/79); Cesarean section (05/17/81); Cesarean section (09/06/89); Tubal ligation; RATH/BSO/cystoscopy; and RIGHT/LEFT HEART CATH AND CORONARY ANGIOGRAPHY (N/A, 07/09/2017).   Current Medications: Current Meds  Medication Sig  . albuterol (PROVENTIL HFA;VENTOLIN HFA) 108 (90 Base) MCG/ACT inhaler Inhale 2 puffs every 6 (six) hours as needed into the lungs for wheezing or shortness of breath.  Marland Kitchen aspirin EC 81 MG tablet Take 81 mg by mouth daily.  Marland Kitchen atorvastatin (LIPITOR) 80 MG tablet TAKE 1 TABLET BY MOUTH ONCE DAILY  . ENTRESTO 49-51 MG TAKE 1 TABLET BY MOUTH TWICE DAILY  . furosemide (LASIX) 20 MG tablet Take 1 tablet (20 mg total) by mouth daily as needed for fluid or edema.  Marland Kitchen levothyroxine (SYNTHROID, LEVOTHROID) 125 MCG tablet Take 1 tablet (125 mcg total) by mouth daily.  Marland Kitchen spironolactone (ALDACTONE) 25 MG tablet Take 0.5 tablets (12.5 mg total) by mouth daily.     Allergies:   Patient has no known allergies.   Social History   Tobacco Use  . Smoking status: Never Smoker  . Smokeless tobacco: Never Used  Substance Use Topics  . Alcohol use: No  . Drug use: No     Family Hx: The patient's family history includes Heart disease in her mother; Hypertension in her father and sister.  ROS:   Please see the history of present illness.    ROS All other systems reviewed and are  negative.   EKGs/Labs/Other Test Reviewed:    EKG:  EKG is  ordered today.  The ekg ordered today demonstrates normal sinus rhythm, heart rate 72, left axis deviation, nonspecific ST-T wave changes, frequent PVCs, QTC 462  Recent Labs: 07/05/2017: B Natriuretic Peptide 953.6 07/07/2017: Magnesium 2.0 07/17/2017: Hemoglobin 12.0; Platelets 185 10/29/2017: BUN 17; Creatinine, Ser 0.73; Potassium 3.8; Sodium 145 12/22/2017: TSH 2.07 12/30/2017: ALT 16   Recent Lipid Panel Lab Results  Component Value Date/Time   CHOL 171 12/30/2017 11:16 AM   TRIG 84 12/30/2017 11:16 AM    HDL 50 12/30/2017 11:16 AM   CHOLHDL 3.4 12/30/2017 11:16 AM   CHOLHDL 7.9 07/10/2017 04:19 AM   LDLCALC 104 (H) 12/30/2017 11:16 AM    Physical Exam:    VS:  BP 120/74   Pulse 72   Ht 5' 4"  (1.626 m)   Wt (!) 301 lb 12.8 oz (136.9 kg)   LMP 10/28/2011   SpO2 91%   BMI 51.80 kg/m     Wt Readings from Last 3 Encounters:  06/25/18 (!) 301 lb 12.8 oz (136.9 kg)  05/23/18 (!) 305 lb (138.3 kg)  03/24/18 (!) 301 lb (136.5 kg)     Physical Exam  Constitutional: She is oriented to person, place, and time. She appears well-developed and well-nourished.  HENT:  Head: Normocephalic and atraumatic.  Eyes: No scleral icterus.  Neck: No JVD present. No thyromegaly present.  Cardiovascular: Normal rate and regular rhythm.  No murmur heard. Pulmonary/Chest: Effort normal. She has no rales.  Abdominal: Soft. She exhibits no distension.  Musculoskeletal: She exhibits no edema.  Lymphadenopathy:    She has no cervical adenopathy.  Neurological: She is alert and oriented to person, place, and time.  Skin: Skin is warm and dry.  Psychiatric: She has a normal mood and affect.    ASSESSMENT & PLAN:    Chronic combined systolic and diastolic heart failure (HCC) EF has improved 20-25% to 35-40% by most recent echocardiogram.  NYHA 2.  Volume status appears stable.  She does have several PVCs on her EKG today.  -Continue current dose of spironolactone, carvedilol, Entresto  -Obtain follow-up BMET, magnesium  -Obtain 24-hour Holter to assess for nonsustained ventricular tachycardia  -If Holter + for NSVT, consider cardiac MRI to better quantify EF +/- referral to EP  Hypertensive heart disease with heart failure ([place]) The patient's blood pressure is controlled on her current regimen.  Continue current therapy.   Coronary artery disease involving native coronary artery of native heart without angina pectoris Mild nonobstructive coronary disease by cardiac catheterization 9/18.  She is not  having angina.  Continue aspirin, statin.  PVC's (premature ventricular contractions)  She has quite a few PVCs on her ECG today.  As noted, I will obtain a BMET, magnesium.  I will also obtain 24-hour Holter monitor to assess PVC burden and rule out NSVT.  Given her cardiomyopathy, if she has NSVT, I will consider whether or not she should undergo cardiac MRI to better assess EF.  She may need referral to EP.  If she has a high PVC burden, she will need referral to EP.   Dispo:  Return in about 6 months (around 12/24/2018) for w/ Dr. Acie Fredrickson, or Richardson Dopp, PA-C.   Medication Adjustments/Labs and Tests Ordered: Current medicines are reviewed at length with the patient today.  Concerns regarding medicines are outlined above.  Tests Ordered: Orders Placed This Encounter  Procedures  . Basic metabolic panel  .  Magnesium  . Holter monitor - 24 hour  . EKG 12-Lead   Medication Changes: No orders of the defined types were placed in this encounter.   Signed, Richardson Dopp, PA-C  06/25/2018 12:59 PM    [facility] [address], [place], [place]  [address] Phone: [phone]; Fax: [phone]

## 2018-06-25 NOTE — Patient Instructions (Signed)
Medication Instructions:  1. Your physician recommends that you continue on your current medications as directed. Please refer to the Current Medication list given to you today.   Labwork: TODAY BMET, MAGNESIUM LEVEL  Testing/Procedures: Your physician has recommended that you wear a 24 HOUR holter monitor. Holter monitors are medical devices that record the heart's electrical activity. Doctors most often use these monitors to diagnose arrhythmias. Arrhythmias are problems with the speed or rhythm of the heartbeat. The monitor is a small, portable device. You can wear one while you do your normal daily activities. This is usually used to diagnose what is causing palpitations/syncope (passing out).    Follow-Up: 6 MONTHS WITH DR. Acie Fredrickson  Any Other Special Instructions Will Be Listed Below (If Applicable).     If you need a refill on your cardiac medications before your next appointment, please call your pharmacy.

## 2018-06-26 LAB — BASIC METABOLIC PANEL
BUN / CREAT RATIO: 23 (ref 9–23)
BUN: 19 mg/dL (ref 6–24)
CO2: 21 mmol/L (ref 20–29)
Calcium: 9.4 mg/dL (ref 8.7–10.2)
Chloride: 106 mmol/L (ref 96–106)
Creatinine, Ser: 0.84 mg/dL (ref 0.57–1.00)
GFR, EST AFRICAN AMERICAN: 90 mL/min/{1.73_m2} (ref >59)
GFR, EST NON AFRICAN AMERICAN: 78 mL/min/{1.73_m2} (ref >59)
Glucose: 106 mg/dL — ABNORMAL HIGH (ref 65–99)
POTASSIUM: 4 mmol/L (ref 3.5–5.2)
SODIUM: 142 mmol/L (ref 134–144)

## 2018-06-26 LAB — MAGNESIUM: Magnesium: 2 mg/dL (ref 1.6–2.3)

## 2018-06-30 ENCOUNTER — Ambulatory Visit (INDEPENDENT_AMBULATORY_CARE_PROVIDER_SITE_OTHER): Payer: BLUE CROSS/BLUE SHIELD

## 2018-06-30 DIAGNOSIS — I5042 Chronic combined systolic (congestive) and diastolic (congestive) heart failure: Secondary | ICD-10-CM | POA: Diagnosis not present

## 2018-06-30 DIAGNOSIS — I493 Ventricular premature depolarization: Secondary | ICD-10-CM | POA: Diagnosis not present

## 2018-07-26 ENCOUNTER — Ambulatory Visit: Payer: BLUE CROSS/BLUE SHIELD | Admitting: Family Medicine

## 2018-07-27 ENCOUNTER — Telehealth: Payer: Self-pay

## 2018-07-27 NOTE — Telephone Encounter (Signed)
Patient will be at appointment tomorrow at 8 am.

## 2018-07-28 ENCOUNTER — Ambulatory Visit (INDEPENDENT_AMBULATORY_CARE_PROVIDER_SITE_OTHER): Payer: BLUE CROSS/BLUE SHIELD | Admitting: Family Medicine

## 2018-07-28 ENCOUNTER — Encounter: Payer: Self-pay | Admitting: Family Medicine

## 2018-07-28 VITALS — BP 144/80 | HR 68 | Temp 98.0°F | Ht 64.0 in | Wt 309.0 lb

## 2018-07-28 DIAGNOSIS — R0602 Shortness of breath: Secondary | ICD-10-CM

## 2018-07-28 DIAGNOSIS — E7849 Other hyperlipidemia: Secondary | ICD-10-CM | POA: Diagnosis not present

## 2018-07-28 DIAGNOSIS — E039 Hypothyroidism, unspecified: Secondary | ICD-10-CM

## 2018-07-28 DIAGNOSIS — R197 Diarrhea, unspecified: Secondary | ICD-10-CM

## 2018-07-28 DIAGNOSIS — Z131 Encounter for screening for diabetes mellitus: Secondary | ICD-10-CM

## 2018-07-28 DIAGNOSIS — R8290 Unspecified abnormal findings in urine: Secondary | ICD-10-CM

## 2018-07-28 DIAGNOSIS — Z1231 Encounter for screening mammogram for malignant neoplasm of breast: Secondary | ICD-10-CM

## 2018-07-28 DIAGNOSIS — Z09 Encounter for follow-up examination after completed treatment for conditions other than malignant neoplasm: Secondary | ICD-10-CM

## 2018-07-28 LAB — POCT URINALYSIS DIP (MANUAL ENTRY)
Bilirubin, UA: NEGATIVE
Glucose, UA: NEGATIVE mg/dL
Ketones, POC UA: NEGATIVE mg/dL
Leukocytes, UA: NEGATIVE
Nitrite, UA: NEGATIVE
Protein Ur, POC: NEGATIVE mg/dL
Spec Grav, UA: 1.025 (ref 1.010–1.025)
Urobilinogen, UA: 0.2 E.U./dL
pH, UA: 5.5 (ref 5.0–8.0)

## 2018-07-28 LAB — POCT GLYCOSYLATED HEMOGLOBIN (HGB A1C): Hemoglobin A1C: 6.6 % — AB (ref 4.0–5.6)

## 2018-07-28 MED ORDER — ALBUTEROL SULFATE HFA 108 (90 BASE) MCG/ACT IN AERS: 2.0000 | INHALATION_SPRAY | Freq: Four times a day (QID) | RESPIRATORY_TRACT | 12 refills | Status: AC | PRN

## 2018-07-28 MED ORDER — ALBUTEROL SULFATE HFA 108 (90 BASE) MCG/ACT IN AERS: 2.0000 | INHALATION_SPRAY | Freq: Four times a day (QID) | RESPIRATORY_TRACT | 2 refills | Status: DC | PRN

## 2018-07-28 NOTE — Progress Notes (Signed)
Follow Up  Subjective:    Patient ID: Brittney Ross, female    DOB: [birth year], 57 y.o.   MRN: [id number]   Chief Complaint  Patient presents with  . Follow-up    Chronic condition   HPI  Brittney Ross is a 57 year old female with a past medical history of Thyroid Disease, Morbid Obesity, Hypertension, Hyperlipidemia, and Anemia. She is here today for follow up.    Current Status: Since her last office visit, she is doing well with no complaints.   She recently completed a heart monitor for 48 hours, as ordered by Richardson Dopp, PA in Cardiology, and results were negative for abnormalities.   She continues to get Cortisone Injections for bilateral chronic knee pain. She states that injections usually last for 6 months. He knee pain is stable today. Her last injection was 05/2018.  She continues to experience occasional diarrhea. No reports of GI problems such as nausea, vomiting, and constipation. She has no reports of blood in stools, dysuria and hematuria.   She will re-schedule Mammogram soon.   She denies increased thirst, frequent urination, hunger, fatigue, blurred vision, excessive hunger, excessive thirst, weight gain, weight loss, and poor wound healing.   She denies fevers, chills, fatigue, recent infections, weight loss, and night sweats. She has not had any headaches, visual changes, dizziness, and falls. No chest pain, heart palpitations, cough and shortness of breath reported. No depression or anxiety reported.   Past Medical History:  Diagnosis Date  . Anemia   . Chronic combined systolic and diastolic heart failure (HCC) 07/05/2017   Non-ischemic CM // Echo 9/18:EF 20-25, severe diff HK, Gr 1 DD, mild MR, mod LAE // R/L Heart Cath 9/18: pD1 40, mD1 30; o/w normal cors // Echo 5/19: EF 35-40, trivial AI, mildly dilated aortic root (38 mm), MAC, mild LAE   . Hyperlipidemia   . Hypertension    Pt states she "doesn't have HTN"  . Morbid obesity ([place])   . Thyroid disease  hyperthyroidism    Family History  Problem Relation Age of Onset  . Hypertension Father   . Heart disease Mother   . Hypertension Sister     Social History   Socioeconomic History  . Marital status: Married    Spouse name: Not on file  . Number of children: Not on file  . Years of education: Not on file  . Highest education level: Not on file  Occupational History  . Not on file  Social Needs  . Financial resource strain: Not on file  . Food insecurity:    Worry: Not on file    Inability: Not on file  . Transportation needs:    Medical: Not on file    Non-medical: Not on file  Tobacco Use  . Smoking status: Never Smoker  . Smokeless tobacco: Never Used  Substance and Sexual Activity  . Alcohol use: No  . Drug use: No  . Sexual activity: Yes    Birth control/protection: Surgical  Lifestyle  . Physical activity:    Days per week: Not on file    Minutes per session: Not on file  . Stress: Not on file  Relationships  . Social connections:    Talks on phone: Not on file    Gets together: Not on file    Attends religious service: Not on file    Active member of club or organization: Not on file    Attends meetings of clubs or organizations: Not  on file    Relationship status: Not on file  . Intimate partner violence:    Fear of current or ex partner: Not on file    Emotionally abused: Not on file    Physically abused: Not on file    Forced sexual activity: Not on file  Other Topics Concern  . Not on file  Social History Narrative  . Not on file    Past Surgical History:  Procedure Laterality Date  . CESAREAN SECTION  12/07/79  . CESAREAN SECTION  05/17/81  . CESAREAN SECTION  09/06/89  . RATH/BSO/cystoscopy    . RIGHT/LEFT HEART CATH AND CORONARY ANGIOGRAPHY N/A 07/09/2017   Procedure: RIGHT/LEFT HEART CATH AND CORONARY ANGIOGRAPHY;  Surgeon: Troy Sine, MD;  Location: [facility];  Service: Cardiovascular;  Laterality: N/A;  . TUBAL LIGATION       Immunization History  Administered Date(s) Administered  . Pneumococcal Polysaccharide-23 07/07/2017    Current Meds  Medication Sig  . albuterol (PROVENTIL HFA;VENTOLIN HFA) 108 (90 Base) MCG/ACT inhaler Inhale 2 puffs into the lungs every 6 (six) hours as needed for wheezing or shortness of breath.  Marland Kitchen aspirin EC 81 MG tablet Take 81 mg by mouth daily.  Marland Kitchen atorvastatin (LIPITOR) 80 MG tablet TAKE 1 TABLET BY MOUTH ONCE DAILY  . ENTRESTO 49-51 MG TAKE 1 TABLET BY MOUTH TWICE DAILY  . furosemide (LASIX) 20 MG tablet Take 1 tablet (20 mg total) by mouth daily as needed for fluid or edema.  Marland Kitchen levothyroxine (SYNTHROID, LEVOTHROID) 125 MCG tablet Take 1 tablet (125 mcg total) by mouth daily.  Marland Kitchen spironolactone (ALDACTONE) 25 MG tablet Take 0.5 tablets (12.5 mg total) by mouth daily.  . [DISCONTINUED] albuterol (PROVENTIL HFA;VENTOLIN HFA) 108 (90 Base) MCG/ACT inhaler Inhale 2 puffs every 6 (six) hours as needed into the lungs for wheezing or shortness of breath.    No Known Allergies  BP (!) 144/80 (BP Location: Left Arm, Patient Position: Sitting, Cuff Size: Large)   Pulse 68   Temp 98 F (36.7 C) (Oral)   Ht 5' 4"  (1.626 m)   Wt (!) 309 lb (140.2 kg)   LMP 10/28/2011   SpO2 94%   BMI 53.04 kg/m   Review of Systems  Constitutional: Negative.   Respiratory: Positive for shortness of breath (occasional).   Cardiovascular: Negative.   Gastrointestinal: Positive for abdominal distention (Obese) and diarrhea (Occasional).  Genitourinary: Negative.   Musculoskeletal: Negative.   Skin: Negative.   Allergic/Immunologic: Negative.   Neurological: Negative.   Psychiatric/Behavioral: Negative.    Objective:   Physical Exam  Constitutional: She is oriented to person, place, and time. She appears well-developed and well-nourished.  HENT:  Head: Normocephalic and atraumatic.  Eyes: Pupils are equal, round, and reactive to light. EOM are normal.  Neck: Normal range of motion. Neck  supple.  Cardiovascular: Normal rate, regular rhythm, normal heart sounds and intact distal pulses.  Pulmonary/Chest: Effort normal and breath sounds normal.  Abdominal: Soft. Bowel sounds are normal.  Musculoskeletal: Normal range of motion.  Neurological: She is alert and oriented to person, place, and time.  Skin: Skin is warm and dry.  Psychiatric: She has a normal mood and affect. Her behavior is normal. Judgment and thought content normal.  Nursing note and vitals reviewed.  Assessment & Plan:   1. Screening for diabetes mellitus Hgb A1c is stable at 6.6 today. She will continue to decrease foods/beverages high in sugars and carbs and follow Heart Healthy or  DASH diet. Increase physical activity to at least 30 minutes cardio exercise daily.  - POCT glycosylated hemoglobin (Hb A1C) - POCT urinalysis dipstick  2. Hypothyroidism, unspecified type TSH, T4, Free within normal limits on 12/22/2017. Continue Synthroid as prescribed.   3. Other hyperlipidemia Lipid level on 12/30/2017 was stable. Continue Atorvastatin as prescribed.   4. Shortness of breath - albuterol (PROVENTIL HFA;VENTOLIN HFA) 108 (90 Base) MCG/ACT inhaler; Inhale 2 puffs into the lungs every 6 (six) hours as needed for wheezing or shortness of breath.  Dispense: 8 g; Refill: 12  5. Diarrhea, unspecified type Stable.   6. Encounter for screening mammogram for breast cancer She will re-schedule Mammogram soon.   7. Abnormal urinalysis - Urine Culture  8. Follow up She will follow up in 6 months.   Meds ordered this encounter  Medications  . DISCONTD: albuterol (PROVENTIL HFA;VENTOLIN HFA) 108 (90 Base) MCG/ACT inhaler    Sig: Inhale 2 puffs into the lungs every 6 (six) hours as needed for wheezing or shortness of breath.    Dispense:  8 g    Refill:  2  . albuterol (PROVENTIL HFA;VENTOLIN HFA) 108 (90 Base) MCG/ACT inhaler    Sig: Inhale 2 puffs into the lungs every 6 (six) hours as needed for wheezing or  shortness of breath.    Dispense:  8 g    Refill:  [place],  MSN, [facility] [place] [address] [place], [place] [address] [phone]

## 2018-07-30 LAB — URINE CULTURE

## 2018-08-16 ENCOUNTER — Other Ambulatory Visit: Payer: Self-pay | Admitting: Family Medicine

## 2018-08-24 ENCOUNTER — Telehealth: Payer: Self-pay

## 2018-08-24 MED ORDER — ATORVASTATIN CALCIUM 80 MG PO TABS: 80.0000 mg | ORAL_TABLET | Freq: Every day | ORAL | 0 refills | Status: DC

## 2018-08-24 NOTE — Telephone Encounter (Signed)
cay 

## 2018-08-24 NOTE — Telephone Encounter (Signed)
Medication sent to pharmacy  

## 2018-09-07 ENCOUNTER — Other Ambulatory Visit: Payer: Self-pay | Admitting: Physician Assistant

## 2018-09-07 DIAGNOSIS — I5042 Chronic combined systolic (congestive) and diastolic (congestive) heart failure: Secondary | ICD-10-CM

## 2018-10-08 ENCOUNTER — Encounter: Payer: Self-pay | Admitting: Family Medicine

## 2018-10-08 ENCOUNTER — Ambulatory Visit (INDEPENDENT_AMBULATORY_CARE_PROVIDER_SITE_OTHER): Payer: BLUE CROSS/BLUE SHIELD | Admitting: Family Medicine

## 2018-10-08 VITALS — BP 146/88 | HR 88 | Temp 99.9°F | Ht 64.0 in | Wt 306.0 lb

## 2018-10-08 DIAGNOSIS — Z09 Encounter for follow-up examination after completed treatment for conditions other than malignant neoplasm: Secondary | ICD-10-CM | POA: Diagnosis not present

## 2018-10-08 DIAGNOSIS — J101 Influenza due to other identified influenza virus with other respiratory manifestations: Secondary | ICD-10-CM

## 2018-10-08 DIAGNOSIS — R509 Fever, unspecified: Secondary | ICD-10-CM | POA: Diagnosis not present

## 2018-10-08 LAB — POCT INFLUENZA A/B
Influenza A, POC: NEGATIVE
Influenza B, POC: POSITIVE — AB

## 2018-10-08 MED ORDER — AMOXICILLIN 500 MG PO CAPS: 500.0000 mg | ORAL_CAPSULE | Freq: Two times a day (BID) | ORAL | 0 refills | Status: DC

## 2018-10-08 NOTE — Progress Notes (Signed)
Sick Visit  Subjective:    Patient ID: Brittney Ross, female    DOB: Mar 14, 1961, 57 y.o.   MRN: [id number]  Chief Complaint  Patient presents with  . Cough  . Nasal Congestion  . Fever   HPI  [name] is a 57 year old female with a past medical history of Thyroid Disease, Morbid Obesity, Hypertension, Hyperlipidemia, and Anemia. She is here today for a sick visit.   Current Status: Since her last office visit, she reports cold and flu symptoms X 3 days now. She has a mild fever today. She states that she has not taken any medication for relief. She reports fevers, headaches, cough, and congestion. She denies visual changes, chest pain, cough, shortness of breath, heart palpitations, and falls. She has occasionally headaches and dizziness with position changes. Denies severe headaches, confusion, seizures, double vision, and blurred vision, nausea and vomiting.  She denies chills, fatigue, recent infections, weight loss, and night sweats. No reports of GI problems such as nausea, vomiting, diarrhea, and constipation. She has no reports of blood in stools, dysuria and hematuria. No depression or anxiety reported. She denies pain today.   Review of Systems  Constitutional: Negative.   HENT: Negative.   Eyes: Negative.   Respiratory: Negative.   Cardiovascular: Negative.   Gastrointestinal: Negative.   Endocrine: Negative.   Genitourinary: Negative.   Musculoskeletal: Negative.   Skin: Negative.   Allergic/Immunologic: Negative.   Neurological: Positive for dizziness and headaches.  Hematological: Negative.   Psychiatric/Behavioral: Negative.    Objective:   Physical Exam Vitals signs and nursing note reviewed.  Constitutional:      Appearance: Normal appearance. She is obese.  HENT:     Head: Normocephalic and atraumatic.     Right Ear: Tympanic membrane, ear canal and external ear normal.     Left Ear: Tympanic membrane, ear canal and external ear normal.     Nose: Nose  normal.     Mouth/Throat:     Mouth: Mucous membranes are moist.     Pharynx: Oropharynx is clear.  Eyes:     Extraocular Movements: Extraocular movements intact.     Conjunctiva/sclera: Conjunctivae normal.     Pupils: Pupils are equal, round, and reactive to light.  Neck:     Musculoskeletal: Normal range of motion and neck supple.  Cardiovascular:     Rate and Rhythm: Normal rate and regular rhythm.  Pulmonary:     Effort: Pulmonary effort is normal.     Breath sounds: Normal breath sounds.  Abdominal:     General: Bowel sounds are normal. There is distension.     Palpations: Abdomen is soft.  Musculoskeletal: Normal range of motion.  Skin:    General: Skin is warm and dry.  Neurological:     General: No focal deficit present.     Mental Status: She is alert and oriented to person, place, and time.  Psychiatric:        Mood and Affect: Mood normal.        Behavior: Behavior normal.        Thought Content: Thought content normal.        Judgment: Judgment normal.    Assessment & Plan:   1. Influenza A with respiratory manifestations Positive for Influenza today. We will initiate Amoxicillin today.  - amoxicillin (AMOXIL) 500 MG capsule; Take 1 capsule (500 mg total) by mouth 2 (two) times daily.  Dispense: 20 capsule; Refill: 0  2. Fever, unspecified fever  cause - POCT Influenza A/B  3. Follow up She will keep follow up appointment 01/2019.  Meds ordered this encounter  Medications  . amoxicillin (AMOXIL) 500 MG capsule    Sig: Take 1 capsule (500 mg total) by mouth 2 (two) times daily.    Dispense:  20 capsule    Refill:  0    Kathe Becton,  MSN, FNP-C Patient [facility] [address] [place], [place] [address] [phone]

## 2018-10-08 NOTE — Patient Instructions (Addendum)
Amoxicillin capsules or tablets What is this medicine? AMOXICILLIN (a mox i SIL in) is a penicillin antibiotic. It is used to treat certain kinds of bacterial infections. It will not work for colds, flu, or other viral infections. This medicine may be used for other purposes; ask your health care provider or pharmacist if you have questions. COMMON BRAND NAME(S): Amoxil, Moxilin, Sumox, Trimox What should I tell my health care provider before I take this medicine? They need to know if you have any of these conditions: -asthma -kidney disease -an unusual or allergic reaction to amoxicillin, other penicillins, cephalosporin antibiotics, other medicines, foods, dyes, or preservatives -pregnant or trying to get pregnant -breast-feeding How should I use this medicine? Take this medicine by mouth with a glass of water. Follow the directions on your prescription label. You may take this medicine with food or on an empty stomach. Take your medicine at regular intervals. Do not take your medicine more often than directed. Take all of your medicine as directed even if you think your are better. Do not skip doses or stop your medicine early. Talk to your pediatrician regarding the use of this medicine in children. While this drug may be prescribed for selected conditions, precautions do apply. Overdosage: If you think you have taken too much of this medicine contact a poison control center or emergency room at once. NOTE: This medicine is only for you. Do not share this medicine with others. What if I miss a dose? If you miss a dose, take it as soon as you can. If it is almost time for your next dose, take only that dose. Do not take double or extra doses. What may interact with this medicine? -amiloride -birth control pills -chloramphenicol -macrolides -probenecid -sulfonamides -tetracyclines This list may not describe all possible interactions. Give your health care provider a list of all the  medicines, herbs, non-prescription drugs, or dietary supplements you use. Also tell them if you smoke, drink alcohol, or use illegal drugs. Some items may interact with your medicine. What should I watch for while using this medicine? Tell your doctor or health care professional if your symptoms do not improve in 2 or 3 days. Take all of the doses of your medicine as directed. Do not skip doses or stop your medicine early. If you are diabetic, you may get a false positive result for sugar in your urine with certain brands of urine tests. Check with your doctor. Do not treat diarrhea with over-the-counter products. Contact your doctor if you have diarrhea that lasts more than 2 days or if the diarrhea is severe and watery. What side effects may I notice from receiving this medicine? Side effects that you should report to your doctor or health care professional as soon as possible: -allergic reactions like skin rash, itching or hives, swelling of the face, lips, or tongue -breathing problems -dark urine -redness, blistering, peeling or loosening of the skin, including inside the mouth -seizures -severe or watery diarrhea -trouble passing urine or change in the amount of urine -unusual bleeding or bruising -unusually weak or tired -yellowing of the eyes or skin Side effects that usually do not require medical attention (report to your doctor or health care professional if they continue or are bothersome): -dizziness -headache -stomach upset -trouble sleeping This list may not describe all possible side effects. Call your doctor for medical advice about side effects. You may report side effects to FDA at 1-800-FDA-1088. Where should I keep my medicine? Keep out  of the reach of children. Store between 68 and 77 degrees F (20 and 25 degrees C). Keep bottle closed tightly. Throw away any unused medicine after the expiration date. NOTE: This sheet is a summary. It may not cover all possible  information. If you have questions about this medicine, talk to your doctor, pharmacist, or health care provider.  2019 Elsevier/Gold Standard (2007-12-28 14:10:59)       Rehydration, Adult Rehydration is the replacement of body fluids and salts and minerals (electrolytes) that are lost during dehydration. Dehydration is when there is not enough fluid or water in the body. This happens when you lose more fluids than you take in. Common causes of dehydration include:  Vomiting.  Diarrhea.  Excessive sweating, such as from heat exposure or exercise.  Taking medicines that cause the body to lose excess fluid (diuretics).  Impaired kidney function.  Not drinking enough fluid.  Certain illnesses or infections.  Certain poorly controlled long-term (chronic) illnesses, such as diabetes, heart disease, and kidney disease.  Symptoms of mild dehydration may include thirst, dry lips and mouth, dry skin, and dizziness. Symptoms of severe dehydration may include increased heart rate, confusion, fainting, and not urinating. You can rehydrate by drinking certain fluids or getting fluids through an IV tube, as told by your health care provider. What are the risks? Generally, rehydration is safe. However, one problem that can happen is taking in too much fluid (overhydration). This is rare. If overhydration happens, it can cause an electrolyte imbalance, kidney failure, or a decrease in salt (sodium) levels in the body. How to rehydrate Follow instructions from your health care provider for rehydration. The kind of fluid you should drink and the amount you should drink depend on your condition.  If directed by your health care provider, drink an oral rehydration solution (ORS). This is a drink designed to treat dehydration that is found in pharmacies and retail stores. ? Make an ORS by following instructions on the package. ? Start by drinking small amounts, about  cup (120 mL) every 5-10  minutes. ? Slowly increase how much you drink until you have taken the amount recommended by your health care provider.  Drink enough clear fluids to keep your urine clear or pale yellow. If you were instructed to drink an ORS, finish the ORS first, then start slowly drinking other clear fluids. Drink fluids such as: ? Water. Do not drink only water. Doing that can lead to having too little sodium in your body (hyponatremia). ? Ice chips. ? Fruit juice that you have added water to (diluted juice). ? Low-calorie sports drinks.  If you are severely dehydrated, your health care provider may recommend that you receive fluids through an IV tube in the hospital.  Do not take sodium tablets. Doing that can lead to the condition of having too much sodium in your body (hypernatremia). Eating while you rehydrate Follow instructions from your health care provider about what to eat while you rehydrate. Your health care provider may recommend that you slowly begin eating regular foods in small amounts.  Eat foods that contain a healthy balance of electrolytes, such as bananas, oranges, potatoes, tomatoes, and spinach.  Avoid foods that are greasy or contain a lot of fat or sugar.  In some cases, you may get nutrition through a feeding tube that is passed through your nose and into your stomach (nasogastric tube, or NG tube). This may be done if you have uncontrolled vomiting or diarrhea. Beverages to avoid  Certain beverages may make dehydration worse. While you rehydrate, avoid:  Alcohol.  Caffeine.  Drinks that contain a lot of sugar. These include: ? High-calorie sports drinks. ? Fruit juice that is not diluted. ? Soda.  Check nutrition labels to see how much sugar or caffeine a beverage contains. Signs of dehydration recovery You may be recovering from dehydration if:  You are urinating more often than before you started rehydrating.  Your urine is clear or pale yellow.  Your energy  level improves.  You vomit less frequently.  You have diarrhea less frequently.  Your appetite improves or returns to normal.  You feel less dizzy or less light-headed.  Your skin tone and color start to look more normal. Contact a health care provider if:  You continue to have symptoms of mild dehydration, such as: ? Thirst. ? Dry lips. ? Slightly dry mouth. ? Dry, warm skin. ? Dizziness.  You continue to vomit or have diarrhea. Get help right away if:  You have symptoms of dehydration that get worse.  You feel: ? Confused. ? Weak. ? Like you are going to faint.  You have not urinated in 6-8 hours.  You have very dark urine.  You have trouble breathing.  Your heart rate while sitting still is over 100 beats a minute.  You cannot drink fluids without vomiting.  You have vomiting or diarrhea that: ? Gets worse. ? Does not go away.  You have a fever. This information is not intended to replace advice given to you by your health care provider. Make sure you discuss any questions you have with your health care provider. Document Released: 12/29/2011 Document Revised: 04/25/2016 Document Reviewed: 11/30/2015 Elsevier Interactive Patient Education  2019 [employer].  Influenza, Adult Influenza, more commonly known as "the flu," is a viral infection that mainly affects the respiratory tract. The respiratory tract includes organs that help you breathe, such as the lungs, nose, and throat. The flu causes many symptoms similar to the common cold along with high fever and body aches. The flu spreads easily from person to person (is contagious). Getting a flu shot (influenza vaccination) every year is the best way to prevent the flu. What are the causes? This condition is caused by the influenza virus. You can get the virus by:  Breathing in droplets that are in the air from an infected person's cough or sneeze.  Touching something that has been exposed to the virus  (has been contaminated) and then touching your mouth, nose, or eyes. What increases the risk? The following factors may make you more likely to get the flu:  Not washing or sanitizing your hands often.  Having close contact with many people during cold and flu season.  Touching your mouth, eyes, or nose without first washing or sanitizing your hands.  Not getting a yearly (annual) flu shot. You may have a higher risk for the flu, including serious problems such as a lung infection (pneumonia), if you:  Are older than 65.  Are pregnant.  Have a weakened disease-fighting system (immune system). You may have a weakened immune system if you: ? Have HIV or AIDS. ? Are undergoing chemotherapy. ? Are taking medicines that reduce (suppress) the activity of your immune system.  Have a long-term (chronic) illness, such as heart disease, kidney disease, diabetes, or lung disease.  Have a liver disorder.  Are severely overweight (morbidly obese).  Have anemia. This is a condition that affects your red blood cells.  Have  asthma. What are the signs or symptoms? Symptoms of this condition usually begin suddenly and last 4-14 days. They may include:  Fever and chills.  Headaches, body aches, or muscle aches.  Sore throat.  Cough.  Runny or stuffy (congested) nose.  Chest discomfort.  Poor appetite.  Weakness or fatigue.  Dizziness.  Nausea or vomiting. How is this diagnosed? This condition may be diagnosed based on:  Your symptoms and medical history.  A physical exam.  Swabbing your nose or throat and testing the fluid for the influenza virus. How is this treated? If the flu is diagnosed early, you can be treated with medicine that can help reduce how severe the illness is and how long it lasts (antiviral medicine). This may be given by mouth (orally) or through an IV. Taking care of yourself at home can help relieve symptoms. Your health care provider may  recommend:  Taking over-the-counter medicines.  Drinking plenty of fluids. In many cases, the flu goes away on its own. If you have severe symptoms or complications, you may be treated in a hospital. Follow these instructions at home: Activity  Rest as needed and get plenty of sleep.  Stay home from work or school as told by your health care provider. Unless you are visiting your health care provider, avoid leaving home until your fever has been gone for 24 hours without taking medicine. Eating and drinking  Take an oral rehydration solution (ORS). This is a drink that is sold at pharmacies and retail stores.  Drink enough fluid to keep your urine pale yellow.  Drink clear fluids in small amounts as you are able. Clear fluids include water, ice chips, diluted fruit juice, and low-calorie sports drinks.  Eat bland, easy-to-digest foods in small amounts as you are able. These foods include bananas, applesauce, rice, lean meats, toast, and crackers.  Avoid drinking fluids that contain a lot of sugar or caffeine, such as energy drinks, regular sports drinks, and soda.  Avoid alcohol.  Avoid spicy or fatty foods. General instructions      Take over-the-counter and prescription medicines only as told by your health care provider.  Use a cool mist humidifier to add humidity to the air in your home. This can make it easier to breathe.  Cover your mouth and nose when you cough or sneeze.  Wash your hands with soap and water often, especially after you cough or sneeze. If soap and water are not available, use alcohol-based hand sanitizer.  Keep all follow-up visits as told by your health care provider. This is important. How is this prevented?   Get an annual flu shot. You may get the flu shot in late summer, fall, or winter. Ask your health care provider when you should get your flu shot.  Avoid contact with people who are sick during cold and flu season. This is generally fall  and winter. Contact a health care provider if:  You develop new symptoms.  You have: ? Chest pain. ? Diarrhea. ? A fever.  Your cough gets worse.  You produce more mucus.  You feel nauseous or you vomit. Get help right away if:  You develop shortness of breath or difficulty breathing.  Your skin or nails turn a bluish color.  You have severe pain or stiffness in your neck.  You develop a sudden headache or sudden pain in your face or ear.  You cannot eat or drink without vomiting. Summary  Influenza, more commonly known as "the flu,"  is a viral infection that primarily affects your respiratory tract.  Symptoms of the flu usually begin suddenly and last 4-14 days.  Getting an annual flu shot is the best way to prevent getting the flu.  Stay home from work or school as told by your health care provider. Unless you are visiting your health care provider, avoid leaving home until your fever has been gone for 24 hours without taking medicine.  Keep all follow-up visits as told by your health care provider. This is important. This information is not intended to replace advice given to you by your health care provider. Make sure you discuss any questions you have with your health care provider. Document Released: 10/03/2000 Document Revised: 03/24/2018 Document Reviewed: 03/24/2018 Elsevier Interactive Patient Education  2019 [employer].

## 2018-11-12 ENCOUNTER — Ambulatory Visit (INDEPENDENT_AMBULATORY_CARE_PROVIDER_SITE_OTHER): Payer: BLUE CROSS/BLUE SHIELD | Admitting: Family Medicine

## 2018-11-12 ENCOUNTER — Encounter: Payer: Self-pay | Admitting: Family Medicine

## 2018-11-12 VITALS — BP 128/90 | HR 76 | Temp 98.4°F | Ht 64.0 in | Wt 308.0 lb

## 2018-11-12 DIAGNOSIS — Z8709 Personal history of other diseases of the respiratory system: Secondary | ICD-10-CM

## 2018-11-12 DIAGNOSIS — R0989 Other specified symptoms and signs involving the circulatory and respiratory systems: Secondary | ICD-10-CM | POA: Diagnosis not present

## 2018-11-12 DIAGNOSIS — I10 Essential (primary) hypertension: Secondary | ICD-10-CM

## 2018-11-12 DIAGNOSIS — R05 Cough: Secondary | ICD-10-CM

## 2018-11-12 DIAGNOSIS — R059 Cough, unspecified: Secondary | ICD-10-CM

## 2018-11-12 DIAGNOSIS — Z09 Encounter for follow-up examination after completed treatment for conditions other than malignant neoplasm: Secondary | ICD-10-CM

## 2018-11-12 MED ORDER — CARVEDILOL 12.5 MG PO TABS: 12.5000 mg | ORAL_TABLET | Freq: Two times a day (BID) | ORAL | 1 refills | Status: DC

## 2018-11-12 NOTE — Progress Notes (Signed)
Sick Visit--Established Patient   Subjective:  Patient ID: Brittney Ross, female    DOB: 08-10-1961  Age: 58 y.o. MRN: [id number]  CC:  Chief Complaint  Patient presents with  . Cough    3 days  . Nasal Congestion    HPI Brittney Ross is a 58 year old female who presents for Sick Visit.   Past Medical History:  Diagnosis Date  . Anemia   . Chronic combined systolic and diastolic heart failure (HCC) 07/05/2017   Non-ischemic CM // Echo 9/18:EF 20-25, severe diff HK, Gr 1 DD, mild MR, mod LAE // R/L Heart Cath 9/18: pD1 40, mD1 30; o/w normal cors // Echo 5/19: EF 35-40, trivial AI, mildly dilated aortic root (38 mm), MAC, mild LAE   . Hyperlipidemia   . Hypertension    Pt states she "doesn't have HTN"  . Morbid obesity (Heath)   . Thyroid disease hyperthyroidism   Current Status: Since his last office visit, she has been having cough and chest congestion X 3 days. She states that she has been taking Nyquil for her symptoms, which has been effective. She was positive for Influenza on 10/08/2018 at her last office visit. She has not had Influenza Vaccine this season.   She denies visual changes, chest pain, cough, shortness of breath, heart palpitations, and falls. She has occasionally headaches and dizziness with position changes. Denies severe headaches, confusion, seizures, double vision, and blurred vision, nausea and vomiting.  She has moderate fatigue denies fevers, chills, weight loss, and night sweats. No reports of GI problems such as nausea, vomiting, diarrhea, and constipation. He has no reports of blood in stools, dysuria and hematuria. No depression or anxiety reported. She denies pain today.   Past Surgical History:  Procedure Laterality Date  . CESAREAN SECTION  12/07/79  . CESAREAN SECTION  05/17/81  . CESAREAN SECTION  09/06/89  . RATH/BSO/cystoscopy    . RIGHT/LEFT HEART CATH AND CORONARY ANGIOGRAPHY N/A 07/09/2017   Procedure: RIGHT/LEFT HEART CATH AND CORONARY  ANGIOGRAPHY;  Surgeon: Troy Sine, MD;  Location: [facility];  Service: Cardiovascular;  Laterality: N/A;  . TUBAL LIGATION      Family History  Problem Relation Age of Onset  . Hypertension Father   . Heart disease Mother   . Hypertension Sister     Social History   Socioeconomic History  . Marital status: Married    Spouse name: Not on file  . Number of children: Not on file  . Years of education: Not on file  . Highest education level: Not on file  Occupational History  . Not on file  Social Needs  . Financial resource strain: Not on file  . Food insecurity:    Worry: Not on file    Inability: Not on file  . Transportation needs:    Medical: Not on file    Non-medical: Not on file  Tobacco Use  . Smoking status: Never Smoker  . Smokeless tobacco: Never Used  Substance and Sexual Activity  . Alcohol use: No  . Drug use: No  . Sexual activity: Yes    Birth control/protection: Surgical  Lifestyle  . Physical activity:    Days per week: Not on file    Minutes per session: Not on file  . Stress: Not on file  Relationships  . Social connections:    Talks on phone: Not on file    Gets together: Not on file    Attends  religious service: Not on file    Active member of club or organization: Not on file    Attends meetings of clubs or organizations: Not on file    Relationship status: Not on file  . Intimate partner violence:    Fear of current or ex partner: Not on file    Emotionally abused: Not on file    Physically abused: Not on file    Forced sexual activity: Not on file  Other Topics Concern  . Not on file  Social History Narrative  . Not on file    Outpatient Medications Prior to Visit  Medication Sig Dispense Refill  . albuterol (PROVENTIL HFA;VENTOLIN HFA) 108 (90 Base) MCG/ACT inhaler Inhale 2 puffs into the lungs every 6 (six) hours as needed for wheezing or shortness of breath. 8 g 12  . aspirin EC 81 MG tablet Take 81 mg by mouth  daily.    Marland Kitchen atorvastatin (LIPITOR) 80 MG tablet Take 1 tablet (80 mg total) by mouth daily. 90 tablet 0  . ENTRESTO 49-51 MG TAKE 1 TABLET BY MOUTH TWICE DAILY 60 tablet 11  . furosemide (LASIX) 20 MG tablet Take 1 tablet (20 mg total) by mouth daily as needed for fluid or edema. 90 tablet 3  . levothyroxine (SYNTHROID, LEVOTHROID) 125 MCG tablet Take 1 tablet (125 mcg total) by mouth daily. 90 tablet 3  . spironolactone (ALDACTONE) 25 MG tablet TAKE 1/2 (ONE-HALF) TABLET BY MOUTH ONCE DAILY 45 tablet 2  . amoxicillin (AMOXIL) 500 MG capsule Take 1 capsule (500 mg total) by mouth 2 (two) times daily. 20 capsule 0  . carvedilol (COREG) 12.5 MG tablet Take 1 tablet (12.5 mg total) by mouth 2 (two) times daily. 180 tablet 3   No facility-administered medications prior to visit.     No Known Allergies  ROS Review of Systems  Constitutional: Negative.   HENT: Negative.   Eyes: Negative.   Respiratory: Positive for cough.        Chest congestion  Cardiovascular: Negative.   Gastrointestinal: Negative.   Endocrine: Negative.   Genitourinary: Negative.   Musculoskeletal: Negative.   Skin: Negative.   Allergic/Immunologic: Negative.   Neurological: Negative.   Hematological: Negative.   Psychiatric/Behavioral: Negative.    Objective:    Physical Exam  Constitutional: She is oriented to person, place, and time. She appears well-developed and well-nourished.  HENT:  Head: Normocephalic and atraumatic.  Eyes: Conjunctivae are normal.  Neck: Normal range of motion. Neck supple.  Cardiovascular: Normal rate, regular rhythm, normal heart sounds and intact distal pulses.  Abdominal: Soft. Bowel sounds are normal. She exhibits distension (Obese).  Musculoskeletal: Normal range of motion.  Neurological: She is alert and oriented to person, place, and time. She has normal reflexes.  Skin: Skin is warm and dry.  Psychiatric: She has a normal mood and affect. Her behavior is normal. Judgment  and thought content normal.  Nursing note and vitals reviewed.   BP 128/90   Pulse 76   Temp 98.4 F (36.9 C) (Oral)   Ht _0  (1.626 m)   Wt (!) 308 lb (139.7 kg)   LMP 10/28/2011   SpO2 97%   BMI 52.87 kg/m  Wt Readings from Last 3 Encounters:  11/12/18 (!) 308 lb (139.7 kg)  10/08/18 (!) 306 lb (138.8 kg)  07/28/18 (!) 309 lb (140.2 kg)     Health Maintenance Due  Topic Date Due  . Hepatitis C Screening  04/22/1961  . MAMMOGRAM  01/04/2014  . PAP SMEAR-Modifier  07/16/2014    There are no preventive care reminders to display for this patient.  Lab Results  Component Value Date   TSH 2.07 12/22/2017   Lab Results  Component Value Date   WBC 8.2 07/17/2017   HGB 12.0 07/17/2017   HCT 38.1 07/17/2017   MCV 87.0 07/17/2017   PLT 185 07/17/2017   Lab Results  Component Value Date   NA 142 06/25/2018   K 4.0 06/25/2018   CO2 21 06/25/2018   GLUCOSE 106 (H) 06/25/2018   BUN 19 06/25/2018   CREATININE 0.84 06/25/2018   BILITOT 0.4 12/30/2017   ALKPHOS 73 12/30/2017   AST 9 12/30/2017   ALT 16 12/30/2017   PROT 6.9 12/30/2017   ALBUMIN 3.7 12/30/2017   CALCIUM 9.4 06/25/2018   ANIONGAP 8 07/10/2017   Lab Results  Component Value Date   CHOL 171 12/30/2017   Lab Results  Component Value Date   HDL 50 12/30/2017   Lab Results  Component Value Date   LDLCALC 104 (H) 12/30/2017   Lab Results  Component Value Date   TRIG 84 12/30/2017   Lab Results  Component Value Date   CHOLHDL 3.4 12/30/2017   Lab Results  Component Value Date   HGBA1C 6.6 (A) 07/28/2018   Assessment & Plan:   1. History of influenza Positive for flu on 10/08/2018. Much improved. She will contact office if symptoms do not improve or worsen.   2. Chest congestion Improved. No adventitious lung sounds heard on auscultation. Continue OTC medications as directed.  3. Cough Stable. She will continue OTC cough suppressants as needed.   4. Hypertension, unspecified  type - carvedilol (COREG) 12.5 MG tablet; Take 1 tablet (12.5 mg total) by mouth 2 (two) times daily.  Dispense: 90 tablet; Refill: 1  5. Follow up She will keep previously scheduled follow up.  Meds ordered this encounter  Medications  . carvedilol (COREG) 12.5 MG tablet    Sig: Take 1 tablet (12.5 mg total) by mouth 2 (two) times daily.    Dispense:  90 tablet    Refill:  [place],  MSN, FNP-C [facility] [place], [place] [address] [phone]  Problem List Items Addressed This Visit      Cardiovascular and Mediastinum   HTN (hypertension)   Relevant Medications   carvedilol (COREG) 12.5 MG tablet    Other Visit Diagnoses    History of influenza    -  Primary   Chest congestion       Cough       Follow up          Meds ordered this encounter  Medications  . carvedilol (COREG) 12.5 MG tablet    Sig: Take 1 tablet (12.5 mg total) by mouth 2 (two) times daily.    Dispense:  90 tablet    Refill:  1    Follow-up: No follow-ups on file.    Azzie Glatter, FNP

## 2018-11-12 NOTE — Patient Instructions (Signed)
Guaifenesin oral solution and syrup  What is this medicine?  GUAIFENESIN (gwye FEN e sin) is an expectorant. It helps to thin mucous and make coughs more productive. This medicine is used to treat coughs caused by colds or the flu. It is not intended to treat chronic cough caused by smoking, asthma, emphysema, or heart failure.  This medicine may be used for other purposes; ask your health care provider or pharmacist if you have questions.  COMMON BRAND NAME(S): Altarussin, Altorant, Cough, Diabetic Tussin EX, Diabetic Tussin Mucus Relief, ElixSure EX, Ganidin NR, GERI-TUSSIN, Guiatuss, Iophen-NR, Miltuss EX, Mucinex Children's, Mucus + Chest Congestion, Mucus Relief Children's, Naldecon, Organidin NR, Q-Tussin, Robafen, Robafen Congestion, Robitussin, Robitussin Mucus + Chest Congestion, Scot-Tussin Expectorant, Siltussin DAS, Siltussin Diabetic DAS-Na, Siltussin SA  What should I tell my health care provider before I take this medicine?  They need to know if you have any of these conditions:  -diabetes  -fever  -kidney disease  -an unusual or allergic reaction to guaifenesin, other medicines, foods, dyes, or preservatives  -pregnant or trying to get pregnant  -breast-feeding  How should I use this medicine?  Take this medicine by mouth. Follow the directions on the prescription label. Use a specially marked spoon or container to measure your dose. Household spoons are not accurate. Take your medicine at regular intervals. Do not take it more often than directed.  Talk to your pediatrician regarding the use of this medicine in children. Special care may be needed.  Overdosage: If you think you have taken too much of this medicine contact a poison control center or emergency room at once.  NOTE: This medicine is only for you. Do not share this medicine with others.  What if I miss a dose?  If you miss a dose, take it as soon as you can. If it is almost time for your next dose, take only that dose. Do not take double  or extra doses.  What may interact with this medicine?  Interactions are not expected.  This list may not describe all possible interactions. Give your health care provider a list of all the medicines, herbs, non-prescription drugs, or dietary supplements you use. Also tell them if you smoke, drink alcohol, or use illegal drugs. Some items may interact with your medicine.  What should I watch for while using this medicine?  Do not treat a cough for more than 1 week without consulting your doctor or health care professional. If you also have a high fever, skin rash, continuing headache, or sore throat, see your doctor.  For best results, drink 6 to 8 glasses water daily while you are taking this medicine.  What side effects may I notice from receiving this medicine?  Side effects that you should report to your doctor or health care professional as soon as possible:  -allergic reactions like skin rash, itching or hives, swelling of the face, lips, or tongue  Side effects that usually do not require medical attention (report to your doctor or health care professional if they continue or are bothersome):  -dizziness  -headache  -stomach upset  This list may not describe all possible side effects. Call your doctor for medical advice about side effects. You may report side effects to FDA at 1-800-FDA-1088.  Where should I keep my medicine?  Keep out of the reach of children.  Store at room temperature between 20 and 25 degrees C (68 and 77 degrees F). Do not freeze. Keep container tightly closed. Throw   away any unused medicine after the expiration date.  NOTE: This sheet is a summary. It may not cover all possible information. If you have questions about this medicine, talk to your doctor, pharmacist, or health care provider.  © 2019 Elsevier/Gold Standard (2008-02-16 11:48:29)      Cough, Adult    A cough helps to clear your throat and lungs. A cough may last only 2-3 weeks (acute), or it may last longer than 8 weeks  (chronic). Many different things can cause a cough. A cough may be a sign of an illness or another medical condition.  Follow these instructions at home:  · Pay attention to any changes in your cough.  · Take medicines only as told by your doctor.  ? If you were prescribed an antibiotic medicine, take it as told by your doctor. Do not stop taking it even if you start to feel better.  ? Talk with your doctor before you try using a cough medicine.  · Drink enough fluid to keep your pee (urine) clear or pale yellow.  · If the air is dry, use a cold steam vaporizer or humidifier in your home.  · Stay away from things that make you cough at work or at home.  · If your cough is worse at night, try using extra pillows to raise your head up higher while you sleep.  · Do not smoke, and try not to be around smoke. If you need help quitting, ask your doctor.  · Do not have caffeine.  · Do not drink alcohol.  · Rest as needed.  Contact a doctor if:  · You have new problems (symptoms).  · You cough up yellow fluid (pus).  · Your cough does not get better after 2-3 weeks, or your cough gets worse.  · Medicine does not help your cough and you are not sleeping well.  · You have pain that gets worse or pain that is not helped with medicine.  · You have a fever.  · You are losing weight and you do not know why.  · You have night sweats.  Get help right away if:  · You cough up blood.  · You have trouble breathing.  · Your heartbeat is very fast.  This information is not intended to replace advice given to you by your health care provider. Make sure you discuss any questions you have with your health care provider.  Document Released: 06/19/2011 Document Revised: 03/13/2016 Document Reviewed: 12/13/2014  Elsevier Interactive Patient Education © 2019 Elsevier Inc.

## 2018-11-18 ENCOUNTER — Other Ambulatory Visit: Payer: Self-pay | Admitting: Family Medicine

## 2018-11-24 ENCOUNTER — Other Ambulatory Visit: Payer: Self-pay

## 2018-11-24 MED ORDER — ATORVASTATIN CALCIUM 80 MG PO TABS: 80.0000 mg | ORAL_TABLET | Freq: Every day | ORAL | 1 refills | Status: DC

## 2018-11-24 NOTE — Telephone Encounter (Signed)
Medication sent to pharmacy  

## 2018-12-10 ENCOUNTER — Telehealth: Payer: Self-pay | Admitting: Internal Medicine

## 2018-12-10 MED ORDER — LEVOTHYROXINE SODIUM 125 MCG PO TABS: 125.0000 ug | ORAL_TABLET | Freq: Every day | ORAL | 3 refills | Status: AC

## 2018-12-10 NOTE — Telephone Encounter (Signed)
MEDICATION: levothyroxine (SYNTHROID, LEVOTHROID) 125 MCG tablet  PHARMACY:  [facility] ([facility]), [place] - [address]  IS THIS A 90 DAY SUPPLY :  yes  IS PATIENT OUT OF MEDICATION: no   IF NOT; HOW MUCH IS LEFT:  3 tablet left  LAST APPOINTMENT DATE: @Visit  date not found  NEXT APPOINTMENT DATE:@3 /02/2019  DO WE HAVE YOUR PERMISSION TO LEAVE A DETAILED MESSAGE:  OTHER COMMENTS:    **Let patient know to contact pharmacy at the end of the day to make sure medication is ready. **  ** Please notify patient to allow 48-72 hours to process**  **Encourage patient to contact the pharmacy for refills or they can request refills through [facility]**

## 2018-12-10 NOTE — Telephone Encounter (Signed)
RX sent

## 2018-12-23 ENCOUNTER — Encounter: Payer: Self-pay | Admitting: Internal Medicine

## 2018-12-23 ENCOUNTER — Other Ambulatory Visit: Payer: Self-pay

## 2018-12-23 ENCOUNTER — Ambulatory Visit: Payer: BLUE CROSS/BLUE SHIELD | Admitting: Internal Medicine

## 2018-12-23 VITALS — BP 148/80 | HR 70 | Ht 64.0 in | Wt 308.0 lb

## 2018-12-23 DIAGNOSIS — E890 Postprocedural hypothyroidism: Secondary | ICD-10-CM

## 2018-12-23 DIAGNOSIS — Z6841 Body Mass Index (BMI) 40.0 and over, adult: Secondary | ICD-10-CM | POA: Diagnosis not present

## 2018-12-23 DIAGNOSIS — Z8639 Personal history of other endocrine, nutritional and metabolic disease: Secondary | ICD-10-CM

## 2018-12-23 LAB — TSH: TSH: 3.77 u[IU]/mL (ref 0.35–4.50)

## 2018-12-23 LAB — T4, FREE: Free T4: 1.03 ng/dL (ref 0.60–1.60)

## 2018-12-23 NOTE — Patient Instructions (Signed)
Please stop at the lab.  Continue Levothyroxine 125 mcg daily.  Take the thyroid hormone every day, with water, at least 30 minutes before breakfast, separated by at least 4 hours from: - acid reflux medications - calcium - iron - multivitamins  Please come back for a follow-up appointment in 1 year.

## 2018-12-23 NOTE — Progress Notes (Signed)
Patient ID: Brittney Ross, female   DOB: [birth year], 58 y.o.   MRN: [id number]   HPI  Brittney Ross is a 58 y.o.-year-old female, returning for h/o Graves Ds, with post-surgical hypothyroidism. Last visit 1 year ago.  She had the flu in 10/2018.  Reviewed and addended history:: Patient was diagnosed with Graves' disease in 03/2010.  She was feeling poorly and lost 50 pounds before diagnosis.  03/29/2010: Thyroid ultrasound: inhomogeneous and nodular thyroid, only a single solid nodule measured in left upper lobe, measuring 7 x 4 x 5 cm. Otherwise, small hypo-echogenic nodules.  04/17/2010: Thyroid uptake and scan: uptake 60.7% (10-35%), relatively homogeneous, without focal lesions  After dx of Graves Ds, he was seen by Dr. Janey Greaser (endocrinologist) who performed RAI ablation in 05/24/2013 >> developed post ablative hypothyroidism.  This was initially uncontrolled due to noncompliance, now finally improved.  Review TFTs: Lab Results  Component Value Date   TSH 2.07 12/22/2017   TSH 3.93 09/14/2017   TSH 4.67 (H) 07/17/2017   TSH 8.462 (H) 07/08/2017   TSH 3.75 06/24/2017   TSH 1.01 03/02/2017   TSH 12.72 (H) 12/25/2016   TSH 28.74 (H) 10/17/2016   TSH 36.51 (H) 06/29/2015   TSH 6.26 (H) 05/10/2013   FREET4 1.10 12/22/2017   FREET4 1.01 07/08/2017   FREET4 1.35 06/24/2017   FREET4 1.68 (H) 03/02/2017   FREET4 0.85 12/25/2016   FREET4 0.60 10/17/2016   FREET4 0.38 (L) 06/29/2015   FREET4 0.54 (L) 05/10/2013   FREET4 2.34 (H) 03/26/2010   Pt is on levothyroxine 125 mcg daily, taken: - in am - fasting - at least 30 min from b'fast - no Ca, Fe, MVI, PPIs - not on Biotin  Pt denies: - feeling nodules in neck - hoarseness - dysphagia - choking - SOB with lying down  She also has a history of HTN, HL, anemia - fibroids - status post hysterectomy 11/02/2011.  She was admitted for CHF in 06/2017.  She was started on Lasix, Entresto, Carvedilol, Spironolactone. She continues  on all these.  She gets inj in B knees - last 08/2018.   ROS: Constitutional: no weight gain/no weight loss, no fatigue, no subjective hyperthermia, no subjective hypothermia Eyes: no blurry vision, no xerophthalmia ENT: no sore throat, + see HPI Cardiovascular: no CP/no SOB/no palpitations/no leg swelling Respiratory: no cough/no SOB/no wheezing Gastrointestinal: no N/no V/no D/no C/no acid reflux Musculoskeletal: no muscle aches/+ joint aches (B knees) Skin: no rashes, no hair loss Neurological: no tremors/no numbness/no tingling/no dizziness  I reviewed pt's medications, allergies, PMH, social hx, family hx, and changes were documented in the history of present illness. Otherwise, unchanged from my initial visit note.  Past Medical History:  Diagnosis Date  . Anemia   . Chronic combined systolic and diastolic heart failure (HCC) 07/05/2017   Non-ischemic CM // Echo 9/18:EF 20-25, severe diff HK, Gr 1 DD, mild MR, mod LAE // R/L Heart Cath 9/18: pD1 40, mD1 30; o/w normal cors // Echo 5/19: EF 35-40, trivial AI, mildly dilated aortic root (38 mm), MAC, mild LAE   . Hyperlipidemia   . Hypertension    Pt states she "doesn't have HTN"  . Morbid obesity ([place])   . Thyroid disease hyperthyroidism   Past Surgical History:  Procedure Laterality Date  . CESAREAN SECTION  12/07/79  . CESAREAN SECTION  05/17/81  . CESAREAN SECTION  09/06/89  . RATH/BSO/cystoscopy    . RIGHT/LEFT HEART CATH AND CORONARY ANGIOGRAPHY N/A  07/09/2017   Procedure: RIGHT/LEFT HEART CATH AND CORONARY ANGIOGRAPHY;  Surgeon: Troy Sine, MD;  Location: [facility];  Service: Cardiovascular;  Laterality: N/A;  . TUBAL LIGATION     History   Social History  . Marital Status: Married    Spouse Name: N/A    Number of Children: 3   Occupational History  . 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 Smoking status: Never Smoker   . Smokeless tobacco: Not on file  . Alcohol Use:  No  . Drug Use: No  . Sexually Active: Yes    Birth Control/ Protection: Surgical   Current Outpatient Medications  Medication Sig Dispense Refill  . albuterol (PROVENTIL HFA;VENTOLIN HFA) 108 (90 Base) MCG/ACT inhaler Inhale 2 puffs into the lungs every 6 (six) hours as needed for wheezing or shortness of breath. 8 g 12  . aspirin EC 81 MG tablet Take 81 mg by mouth daily.    Marland Kitchen atorvastatin (LIPITOR) 80 MG tablet Take 1 tablet (80 mg total) by mouth daily. 90 tablet 1  . carvedilol (COREG) 12.5 MG tablet Take 1 tablet (12.5 mg total) by mouth 2 (two) times daily. 90 tablet 1  . ENTRESTO 49-51 MG TAKE 1 TABLET BY MOUTH TWICE DAILY 60 tablet 11  . furosemide (LASIX) 20 MG tablet Take 1 tablet (20 mg total) by mouth daily as needed for fluid or edema. 90 tablet 3  . levothyroxine (SYNTHROID, LEVOTHROID) 125 MCG tablet Take 1 tablet (125 mcg total) by mouth daily. 90 tablet 3  . spironolactone (ALDACTONE) 25 MG tablet TAKE 1/2 (ONE-HALF) TABLET BY MOUTH ONCE DAILY 45 tablet 2   No current facility-administered medications for this visit.    No Known Allergies  Family History  Problem Relation Age of Onset  . Hypertension Father   . Heart disease Mother   . Hypertension Sister    PE: BP (!) 148/80   Pulse 70   Ht _0  (1.626 m) Comment: measured  Wt (!) 308 lb (139.7 kg)   LMP 10/28/2011   SpO2 96%   BMI 52.87 kg/m  Body mass index is 52.87 kg/m. Wt Readings from Last 3 Encounters:  12/23/18 (!) 308 lb (139.7 kg)  11/12/18 (!) 308 lb (139.7 kg)  10/08/18 (!) 306 lb (138.8 kg)   Constitutional: overweight, in NAD Eyes: PERRLA, EOMI, no exophthalmos ENT: moist mucous membranes, no thyromegaly, no thyroid masses felt, no cervical lymphadenopathy Cardiovascular: RRR, No MRG Respiratory: CTA B Gastrointestinal: abdomen soft, NT, ND, BS+ Musculoskeletal: no deformities, strength intact in all 4 Skin: moist, warm, no rashes Neurological: no tremor with outstretched hands, DTR  normal in all 4  ASSESSMENT: 1. History of Graves' disease - s/p RAI ablation 05/2010  2. Hypothyroidism -post ablative  3. Obesity BMI Classification:  < 18.5 underweight   18.5-24.9 normal weight   25.0-29.9 overweight   30.0-34.9 class I obesity   35.0-39.9 class II obesity   ? 40.0 class III obesity   PLAN:  1. and 2.  Patient with previous history of Graves' disease, s/p RAI ablation in 2011, on levothyroxine afterwards for post ablative hypothyroidism.  We had problems in the past with medications and visit noncompliance, but these improved and she is now on a stable dose of levothyroxine.  She is not skipping doses anymore. - latest thyroid labs reviewed with pt >> normal at last visit - she continues on LT4 125 mcg daily - pt feels  good on this dose. - we discussed about taking the thyroid hormone every day, with water, >30 minutes before breakfast, separated by >4 hours from acid reflux medications, calcium, iron, multivitamins.  She is taking this correctly. - will check thyroid tests today: TSH and fT4 - If labs are abnormal, she will need to return for repeat TFTs in 1.5 months - She has no signs of Graves' ophthalmopathy.  We will not check her TSI antibodies for now.  3. Obesity class 3 -Gained more than 10 pounds in last year.  She previously lost 25 pounds after normalizing her thyroid status. -We will recheck her TFTs today to see if they are in the hypothyroid range  Office Visit on 12/23/2018  Component Date Value Ref Range Status  . TSH 12/23/2018 3.77  0.35 - 4.50 uIU/mL Final  . Free T4 12/23/2018 1.03  0.60 - 1.60 ng/dL Final   Comment: Specimens from patients who are undergoing biotin therapy and /or ingesting biotin supplements may contain high levels of biotin.  The higher biotin concentration in these specimens interferes with this Free T4 assay.  Specimens that contain high levels  of biotin may cause false high results for this Free T4 assay.   Please interpret results in light of the total clinical presentation of the patient.      TFTs are normal.  Philemon Kingdom, MD PhD [facility] Endocr[facility]ology

## 2018-12-27 ENCOUNTER — Encounter: Payer: Self-pay | Admitting: Cardiovascular Disease

## 2018-12-27 ENCOUNTER — Ambulatory Visit: Payer: BLUE CROSS/BLUE SHIELD | Admitting: Cardiovascular Disease

## 2018-12-27 VITALS — BP 136/98 | HR 66 | Ht 64.0 in | Wt 312.0 lb

## 2018-12-27 DIAGNOSIS — I5042 Chronic combined systolic (congestive) and diastolic (congestive) heart failure: Secondary | ICD-10-CM | POA: Diagnosis not present

## 2018-12-27 DIAGNOSIS — I110 Hypertensive heart disease with heart failure: Secondary | ICD-10-CM

## 2018-12-27 DIAGNOSIS — E785 Hyperlipidemia, unspecified: Secondary | ICD-10-CM

## 2018-12-27 DIAGNOSIS — I10 Essential (primary) hypertension: Secondary | ICD-10-CM

## 2018-12-27 LAB — BASIC METABOLIC PANEL
BUN/Creatinine Ratio: 18 (ref 9–23)
BUN: 14 mg/dL (ref 6–24)
CALCIUM: 9.4 mg/dL (ref 8.7–10.2)
CO2: 22 mmol/L (ref 20–29)
Chloride: 105 mmol/L (ref 96–106)
Creatinine, Ser: 0.77 mg/dL (ref 0.57–1.00)
GFR calc Af Amer: 99 mL/min/{1.73_m2} (ref >59)
GFR calc non Af Amer: 86 mL/min/{1.73_m2} (ref >59)
Glucose: 113 mg/dL — ABNORMAL HIGH (ref 65–99)
Potassium: 4.1 mmol/L (ref 3.5–5.2)
Sodium: 143 mmol/L (ref 134–144)

## 2018-12-27 LAB — HEPATIC FUNCTION PANEL
ALT: 13 IU/L (ref 0–32)
AST: 13 IU/L (ref 0–40)
Albumin: 3.8 g/dL (ref 3.8–4.9)
Alkaline Phosphatase: 83 IU/L (ref 39–117)
BILIRUBIN TOTAL: 0.4 mg/dL (ref 0.0–1.2)
Bilirubin, Direct: 0.13 mg/dL (ref 0.00–0.40)
Total Protein: 7.1 g/dL (ref 6.0–8.5)

## 2018-12-27 LAB — LIPID PANEL
Chol/HDL Ratio: 3.9 ratio (ref 0.0–4.4)
Cholesterol, Total: 185 mg/dL (ref 100–199)
HDL: 47 mg/dL (ref >39)
LDL Calculated: 121 mg/dL — ABNORMAL HIGH (ref 0–99)
Triglycerides: 86 mg/dL (ref 0–149)
VLDL Cholesterol Cal: 17 mg/dL (ref 5–40)

## 2018-12-27 MED ORDER — SACUBITRIL-VALSARTAN 97-103 MG PO TABS: 1.0000 | ORAL_TABLET | Freq: Two times a day (BID) | ORAL | 11 refills | Status: AC

## 2018-12-27 MED ORDER — CARVEDILOL 12.5 MG PO TABS: 12.5000 mg | ORAL_TABLET | Freq: Two times a day (BID) | ORAL | 3 refills | Status: AC

## 2018-12-27 NOTE — Patient Instructions (Signed)
Medication Instructions:  Your physician has recommended you make the following change in your medication:  INCREASE Entresto to 97-103 mg twice daily  If you need a refill on your cardiac medications before your next appointment, please call your pharmacy.   Lab work: TODAY - cholesterol, liver panel, basic metabolic panel  If you have labs (blood work) drawn today and your tests are completely normal, you will receive your results only by: Marland Kitchen MyChart Message (if you have MyChart) OR . A paper copy in the mail If you have any lab test that is abnormal or we need to change your treatment, we will call you to review the results.  Your physician recommends that you return for lab work in: 3 weeks for basic metabolic panel on Monday March 30 - you may come in anytime between 7:30 am and 4:45 pm You do not have to fast for this lab work   Testing/Procedures: None Ordered   Follow-Up: Your physician recommends that you return for a follow-up appointment on Monday June 9 at 10:00 am

## 2018-12-27 NOTE — Progress Notes (Signed)
Cardiology Office Note:    Date:  12/27/2018   ID:  [name], DOB 1961-06-16, MRN [id number]  PCP:  Azzie Glatter, FNP  Cardiologist:  Mertie Moores, MD    Referring MD: Azzie Glatter, FNP   Problem list  1.  Coronary artery disease-mild coronary artery disease by heart cath September, 2018 2.  Chronic combined systolic and diastolic congestive heart failure 3.  Hyperlipidemia 4.  Essential hypertension 5.  Morbid obesity 6.  Hypothyroidism  Chief Complaint  Patient presents with  . Congestive Heart Failure        Brittney Ross is a 58 y.o. female with a hx of chronic combined systolic and diastolic congestive heart failure.  She was seen in October by Richardson Dopp.  She is done well on Entresto and carvedilol.  Her Lasix has been changed to as needed.  She is on spironolactone 12.5 mg a day.  Echocardiogram in December, 2018 reveals improvement of her left ventricular systolic function.  Her left ventricular ejection fraction is now 30-35%.  She has grade 1 diastolic dysfunction.  This ejection fraction is improved from the echocardiogram from September, 2018 when her ejection fraction was 20-25%.  She is watching her diet.  BP has continued to be mildly elevated  December 27, 2018: Brittney Ross is seen today for follow-up of her chronic combined systolic and diastolic congestive heart failure.  She is been on Entresto and carvedilol.  She has had some slight improvement of her left ventricular function by echo. Breathing has improved.   Wt today is 312 lbs.   Up 4 lbs from Jan.    Past Medical History:  Diagnosis Date  . Anemia   . Chronic combined systolic and diastolic heart failure (HCC) 07/05/2017   Non-ischemic CM // Echo 9/18:EF 20-25, severe diff HK, Gr 1 DD, mild MR, mod LAE // R/L Heart Cath 9/18: pD1 40, mD1 30; o/w normal cors // Echo 5/19: EF 35-40, trivial AI, mildly dilated aortic root (38 mm), MAC, mild LAE   . Hyperlipidemia   . Hypertension    Pt  states she "doesn't have HTN"  . Morbid obesity ([place])   . Thyroid disease hyperthyroidism    Past Surgical History:  Procedure Laterality Date  . CESAREAN SECTION  12/07/79  . CESAREAN SECTION  05/17/81  . CESAREAN SECTION  09/06/89  . RATH/BSO/cystoscopy    . RIGHT/LEFT HEART CATH AND CORONARY ANGIOGRAPHY N/A 07/09/2017   Procedure: RIGHT/LEFT HEART CATH AND CORONARY ANGIOGRAPHY;  Surgeon: Troy Sine, MD;  Location: [facility];  Service: Cardiovascular;  Laterality: N/A;  . TUBAL LIGATION      Current Medications: Current Meds  Medication Sig  . albuterol (PROVENTIL HFA;VENTOLIN HFA) 108 (90 Base) MCG/ACT inhaler Inhale 2 puffs into the lungs every 6 (six) hours as needed for wheezing or shortness of breath.  Marland Kitchen aspirin EC 81 MG tablet Take 81 mg by mouth daily.  Marland Kitchen atorvastatin (LIPITOR) 80 MG tablet Take 1 tablet (80 mg total) by mouth daily.  . carvedilol (COREG) 12.5 MG tablet Take 1 tablet (12.5 mg total) by mouth 2 (two) times daily.  . furosemide (LASIX) 20 MG tablet Take 1 tablet (20 mg total) by mouth daily as needed for fluid or edema.  Marland Kitchen levothyroxine (SYNTHROID, LEVOTHROID) 125 MCG tablet Take 1 tablet (125 mcg total) by mouth daily.  Marland Kitchen spironolactone (ALDACTONE) 25 MG tablet TAKE 1/2 (ONE-HALF) TABLET BY MOUTH ONCE DAILY  . [DISCONTINUED] carvedilol (COREG)  12.5 MG tablet Take 1 tablet (12.5 mg total) by mouth 2 (two) times daily.  . [DISCONTINUED] ENTRESTO 49-51 MG TAKE 1 TABLET BY MOUTH TWICE DAILY     Allergies:   Patient has no known allergies.   Social History   Socioeconomic History  . Marital status: Married    Spouse name: Not on file  . Number of children: Not on file  . Years of education: Not on file  . Highest education level: Not on file  Occupational History  . Not on file  Social Needs  . Financial resource strain: Not on file  . Food insecurity:    Worry: Not on file    Inability: Not on file  . Transportation needs:    Medical:  Not on file    Non-medical: Not on file  Tobacco Use  . Smoking status: Never Smoker  . Smokeless tobacco: Never Used  Substance and Sexual Activity  . Alcohol use: No  . Drug use: No  . Sexual activity: Yes    Birth control/protection: Surgical  Lifestyle  . Physical activity:    Days per week: Not on file    Minutes per session: Not on file  . Stress: Not on file  Relationships  . Social connections:    Talks on phone: Not on file    Gets together: Not on file    Attends religious service: Not on file    Active member of club or organization: Not on file    Attends meetings of clubs or organizations: Not on file    Relationship status: Not on file  Other Topics Concern  . Not on file  Social History Narrative  . Not on file     Family History: The patient's family history includes Heart disease in her mother; Hypertension in her father and sister. ROS:   Please see the history of present illness.     All other systems reviewed and are negative.  EKGs/Labs/Other Studies Reviewed:    The following studies were reviewed today:   EKG:     Recent Labs: 12/30/2017: ALT 16 06/25/2018: BUN 19; Creatinine, Ser 0.84; Magnesium 2.0; Potassium 4.0; Sodium 142 12/23/2018: TSH 3.77  Recent Lipid Panel    Component Value Date/Time   CHOL 171 12/30/2017 1116   TRIG 84 12/30/2017 1116   HDL 50 12/30/2017 1116   CHOLHDL 3.4 12/30/2017 1116   CHOLHDL 7.9 07/10/2017 0419   VLDL 18 07/10/2017 0419   LDLCALC 104 (H) 12/30/2017 1116    Physical Exam: Blood pressure (!) 136/98, pulse 66, height 5' 4"  (1.626 m), weight (!) 312 lb (141.5 kg), last menstrual period 10/28/2011, SpO2 98 %.  GEN:   Morbidly obese, middle age female,   HEENT: Normal NECK: No JVD; No carotid bruits LYMPHATICS: No lymphadenopathy CARDIAC: RR , distant heart sounds RESPIRATORY:  Clear to auscultation without rales, wheezing or rhonchi  ABDOMEN:  Morbid obesity  MUSCULOSKELETAL:  No edema; No deformity    SKIN: Warm and dry NEUROLOGIC:  Alert and oriented x 3   ASSESSMENT:    1. Chronic combined systolic and diastolic congestive heart failure ([place])   2. Hypertension, unspecified type   3. Hyperlipidemia, unspecified hyperlipidemia type   4. Hypertensive heart disease with chronic combined systolic and diastolic congestive heart failure ([place])    PLAN:    In order of problems listed above:  1.  Chronic combined systolic and diastolic congestive heart failure:   Her EF has gradually improved.  Increase Entresto to 97-103 mg BID .   BMP in 3 weeks    2. Morbid obesity :  Encouraged weight loss ,  Carb reduction    Medication Adjustments/Labs and Tests Ordered: Current medicines are reviewed at length with the patient today.  Concerns regarding medicines are outlined above.  Orders Placed This Encounter  Procedures  . Lipid Profile  . Basic Metabolic Panel (BMET)  . Basic Metabolic Panel (BMET)  . Hepatic function panel   Meds ordered this encounter  Medications  . carvedilol (COREG) 12.5 MG tablet    Sig: Take 1 tablet (12.5 mg total) by mouth 2 (two) times daily.    Dispense:  180 tablet    Refill:  3  . sacubitril-valsartan (ENTRESTO) 97-103 MG    Sig: Take 1 tablet by mouth 2 (two) times daily.    Dispense:  60 tablet    Refill:  11    Signed, Mertie Moores, MD  12/27/2018 11:39 AM    [place]

## 2019-01-17 ENCOUNTER — Other Ambulatory Visit: Payer: BLUE CROSS/BLUE SHIELD

## 2019-01-25 ENCOUNTER — Ambulatory Visit (INDEPENDENT_AMBULATORY_CARE_PROVIDER_SITE_OTHER): Payer: BLUE CROSS/BLUE SHIELD | Admitting: Family Medicine

## 2019-01-25 ENCOUNTER — Encounter: Payer: Self-pay | Admitting: Family Medicine

## 2019-01-25 ENCOUNTER — Other Ambulatory Visit: Payer: Self-pay

## 2019-01-25 VITALS — BP 156/80 | HR 80 | Temp 98.2°F | Ht 64.0 in | Wt 317.0 lb

## 2019-01-25 DIAGNOSIS — Z6841 Body Mass Index (BMI) 40.0 and over, adult: Secondary | ICD-10-CM

## 2019-01-25 DIAGNOSIS — I10 Essential (primary) hypertension: Secondary | ICD-10-CM

## 2019-01-25 DIAGNOSIS — E890 Postprocedural hypothyroidism: Secondary | ICD-10-CM | POA: Diagnosis not present

## 2019-01-25 DIAGNOSIS — R0602 Shortness of breath: Secondary | ICD-10-CM

## 2019-01-25 DIAGNOSIS — Z131 Encounter for screening for diabetes mellitus: Secondary | ICD-10-CM

## 2019-01-25 DIAGNOSIS — E66813 Obesity, class 3: Secondary | ICD-10-CM

## 2019-01-25 DIAGNOSIS — Z09 Encounter for follow-up examination after completed treatment for conditions other than malignant neoplasm: Secondary | ICD-10-CM

## 2019-01-25 LAB — POCT URINALYSIS DIP (MANUAL ENTRY)
Bilirubin, UA: NEGATIVE
Blood, UA: NEGATIVE
Glucose, UA: NEGATIVE mg/dL
Ketones, POC UA: NEGATIVE mg/dL
Leukocytes, UA: NEGATIVE
Nitrite, UA: NEGATIVE
Protein Ur, POC: NEGATIVE mg/dL
Spec Grav, UA: 1.025 (ref 1.010–1.025)
Urobilinogen, UA: 0.2 E.U./dL
pH, UA: 5 (ref 5.0–8.0)

## 2019-01-25 LAB — POCT GLYCOSYLATED HEMOGLOBIN (HGB A1C): Hemoglobin A1C: 6.6 % — AB (ref 4.0–5.6)

## 2019-01-25 NOTE — Progress Notes (Addendum)
[facility]   Established Patient Office Visit  Subjective:  Patient ID: Brittney Ross, female    DOB: [birth year]  Age: 58 y.o. MRN: [id number]  CC:  Chief Complaint  Patient presents with   Follow-up    Chronic condition     HPI Brittney Ross is a 58 year old female who presents for follow up today.   Past Medical History:  Diagnosis Date   Anemia    Chronic combined systolic and diastolic heart failure ([place]) 07/05/2017   Non-ischemic CM // Echo 9/18:EF 20-25, severe diff HK, Gr 1 DD, mild MR, mod LAE // R/L Heart Cath 9/18: pD1 40, mD1 30; o/w normal cors // Echo 5/19: EF 35-40, trivial AI, mildly dilated aortic root (38 mm), MAC, mild LAE    Hyperlipidemia    Hypertension    Pt states she "doesn't have HTN"   Morbid obesity ([place])    Thyroid disease hyperthyroidism   Current Status: Since her last office visit, she is doing well and she continues to work towards her goal of weight loss. She has had a 5 lb weight gain since her last office visit and her BMI is no 54.41. She states that she has not made a significant change in her diet and currently does not get daily exercise. Her anxiety is mild today. She denies suicidal ideations, homicidal ideations, or auditory hallucinations. She denies visual changes, chest pain, cough, shortness of breath, heart palpitations, and falls. She has occasional headaches and dizziness with position changes. Denies severe headaches, confusion, seizures, double vision, and blurred vision, nausea and vomiting.   She denies fevers, chills, fatigue, recent infections, weight loss, and night sweats. She has not had any visual changes, and falls. No chest pain, heart palpitations, cough and shortness of breath reported. No reports of GI problems such as diarrhea, and constipation. She has no reports of blood in stools, dysuria and hematuria. She denies pain today.    Past Surgical History:    Procedure Laterality Date   CESAREAN SECTION  12/07/79   CESAREAN SECTION  05/17/81   CESAREAN SECTION  09/06/89   RATH/BSO/cystoscopy     RIGHT/LEFT HEART CATH AND CORONARY ANGIOGRAPHY N/A 07/09/2017   Procedure: RIGHT/LEFT HEART CATH AND CORONARY ANGIOGRAPHY;  Surgeon: Troy Sine, MD;  Location: [facility];  Service: Cardiovascular;  Laterality: N/A;   TUBAL LIGATION      Family History  Problem Relation Age of Onset   Hypertension Father    Heart disease Mother    Hypertension Sister     Social History   Socioeconomic History   Marital status: Married    Spouse name: Not on file   Number of children: Not on file   Years of education: Not on file   Highest education level: Not on file  Occupational History   Not on file  Social Needs   Financial resource strain: Not on file   Food insecurity:    Worry: Not on file    Inability: Not on file   Transportation needs:    Medical: Not on file    Non-medical: Not on file  Tobacco Use   Smoking status: Never Smoker   Smokeless tobacco: Never Used  Substance and Sexual Activity   Alcohol use: No   Drug use: No   Sexual activity: Yes    Birth control/protection: Surgical  Lifestyle   Physical activity:    Days per  week: Not on file    Minutes per session: Not on file   Stress: Not on file  Relationships   Social connections:    Talks on phone: Not on file    Gets together: Not on file    Attends religious service: Not on file    Active member of club or organization: Not on file    Attends meetings of clubs or organizations: Not on file    Relationship status: Not on file   Intimate partner violence:    Fear of current or ex partner: Not on file    Emotionally abused: Not on file    Physically abused: Not on file    Forced sexual activity: Not on file  Other Topics Concern   Not on file  Social History Narrative   Not on file    Outpatient Medications Prior to Visit   Medication Sig Dispense Refill   albuterol (PROVENTIL HFA;VENTOLIN HFA) 108 (90 Base) MCG/ACT inhaler Inhale 2 puffs into the lungs every 6 (six) hours as needed for wheezing or shortness of breath. 8 g 12   aspirin EC 81 MG tablet Take 81 mg by mouth daily.     atorvastatin (LIPITOR) 80 MG tablet Take 1 tablet (80 mg total) by mouth daily. 90 tablet 1   carvedilol (COREG) 12.5 MG tablet Take 1 tablet (12.5 mg total) by mouth 2 (two) times daily. 180 tablet 3   furosemide (LASIX) 20 MG tablet Take 1 tablet (20 mg total) by mouth daily as needed for fluid or edema. 90 tablet 3   levothyroxine (SYNTHROID, LEVOTHROID) 125 MCG tablet Take 1 tablet (125 mcg total) by mouth daily. 90 tablet 3   sacubitril-valsartan (ENTRESTO) 97-103 MG Take 1 tablet by mouth 2 (two) times daily. 60 tablet 11   spironolactone (ALDACTONE) 25 MG tablet TAKE 1/2 (ONE-HALF) TABLET BY MOUTH ONCE DAILY 45 tablet 2   No facility-administered medications prior to visit.     No Known Allergies  ROS Review of Systems  Constitutional: Negative.   HENT: Negative.   Eyes: Negative.   Respiratory: Negative.   Cardiovascular: Negative.   Gastrointestinal: Positive for abdominal distention (Obese).  Endocrine: Negative.   Genitourinary: Negative.   Musculoskeletal: Negative.   Skin: Negative.   Allergic/Immunologic: Negative.   Neurological: Positive for dizziness and headaches.  Hematological: Negative.   Psychiatric/Behavioral: Negative.       Objective:    Physical Exam  Constitutional: She is oriented to person, place, and time. She appears well-developed and well-nourished.  HENT:  Head: Normocephalic and atraumatic.  Eyes: Conjunctivae are normal.  Neck: Normal range of motion. Neck supple.  Cardiovascular: Normal rate, regular rhythm, normal heart sounds and intact distal pulses.  Pulmonary/Chest: Effort normal and breath sounds normal.  Abdominal: Soft. Bowel sounds are normal.   Musculoskeletal: Normal range of motion.  Neurological: She is alert and oriented to person, place, and time. She has normal reflexes.  Skin: Skin is warm and dry.  Psychiatric: She has a normal mood and affect. Her behavior is normal. Judgment and thought content normal.  Nursing note and vitals reviewed.   BP (!) 156/80    Pulse 80    Temp 98.2 F (36.8 C) (Oral)    Ht _0  (1.626 m)    Wt (!) 317 lb (143.8 kg)    LMP 10/28/2011    SpO2 98%    BMI 54.41 kg/m  Wt Readings from Last 3 Encounters:  01/25/19 (!) 317 lb (  143.8 kg)  12/27/18 (!) 312 lb (141.5 kg)  12/23/18 (!) 308 lb (139.7 kg)     Health Maintenance Due  Topic Date Due   Hepatitis C Screening  Sep 20, 1961   MAMMOGRAM  01/04/2014   PAP SMEAR-Modifier  07/16/2014    There are no preventive care reminders to display for this patient.  Lab Results  Component Value Date   TSH 3.77 12/23/2018   Lab Results  Component Value Date   WBC 8.2 07/17/2017   HGB 12.0 07/17/2017   HCT 38.1 07/17/2017   MCV 87.0 07/17/2017   PLT 185 07/17/2017   Lab Results  Component Value Date   NA 143 12/27/2018   K 4.1 12/27/2018   CO2 22 12/27/2018   GLUCOSE 113 (H) 12/27/2018   BUN 14 12/27/2018   CREATININE 0.77 12/27/2018   BILITOT 0.4 12/27/2018   ALKPHOS 83 12/27/2018   AST 13 12/27/2018   ALT 13 12/27/2018   PROT 7.1 12/27/2018   ALBUMIN 3.8 12/27/2018   CALCIUM 9.4 12/27/2018   ANIONGAP 8 07/10/2017   Lab Results  Component Value Date   CHOL 185 12/27/2018   Lab Results  Component Value Date   HDL 47 12/27/2018   Lab Results  Component Value Date   LDLCALC 121 (H) 12/27/2018   Lab Results  Component Value Date   TRIG 86 12/27/2018   Lab Results  Component Value Date   CHOLHDL 3.9 12/27/2018   Lab Results  Component Value Date   HGBA1C 6.6 (A) 01/25/2019    Assessment & Plan:   1. Class 3 severe obesity due to excess calories with serious comorbidity and body mass index (BMI) of 50.0 to  59.9 in adult [facility]) We will continue to encourage her to decrease foods/beverages high in sugars and carbs and follow Heart Healthy or DASH diet. Increase physical activity to at least 30 minutes cardio exercise daily.   2. Hypertension, unspecified type Blood pressure is stable today. She will continue Carvedilol and Enstresto as prescribed. We will continue to encourage her to decrease high sodium intake, excessive alcohol intake, increase potassium intake, smoking cessation, and increase physical activity of at least 30 minutes of cardio activity daily. She will continue to follow Heart Healthy or DASH diet.  3. Hypothyroidism, postablative Continue Synthroid as prescribed.   4. Shortness of breath No reports of respiratory distress noted or reported.   5. Screening for diabetes mellitus Hgb A1c is stable at 6.6.  - POCT glycosylated hemoglobin (Hb A1C) - POCT urinalysis dipstick  6. Follow up She will follow up in 3 months.   No orders of the defined types were placed in this encounter.   Orders Placed This Encounter  Procedures   POCT glycosylated hemoglobin (Hb A1C)   POCT urinalysis dipstick    Referral Orders  No referral(s) requested today    Kathe Becton,  MSN, FNP-C Patient [place], [place] [address] [phone]   Problem List Items Addressed This Visit      Cardiovascular and Mediastinum   HTN (hypertension)     Endocrine   Hypothyroidism, postablative (Chronic)     Other   Obesity - Primary    Other Visit Diagnoses    Shortness of breath       Screening for diabetes mellitus       Relevant Orders   POCT glycosylated hemoglobin (Hb A1C) (Completed)   POCT urinalysis dipstick (Completed)   Follow  up          No orders of the defined types were placed in this encounter.   Follow-up: Return in about 3 months (around 04/26/2019).    Azzie Glatter, FNP

## 2019-01-25 NOTE — Patient Instructions (Signed)
Heart-Healthy Eating Plan Heart-healthy meal planning includes:  Eating less unhealthy fats.  Eating more healthy fats.  Making other changes in your diet. Talk with your doctor or a diet specialist (dietitian) to create an eating plan that is right for you. What is my plan? Your doctor may recommend an eating plan that includes:  Total fat: ______% or less of total calories a day.  Saturated fat: ______% or less of total calories a day.  Cholesterol: less than _________mg a day. What are tips for following this plan? Cooking Avoid frying your food. Try to bake, boil, grill, or broil it instead. You can also reduce fat by:  Removing the skin from poultry.  Removing all visible fats from meats.  Steaming vegetables in water or broth. Meal planning   At meals, divide your plate into four equal parts: ? Fill one-half of your plate with vegetables and green salads. ? Fill one-fourth of your plate with whole grains. ? Fill one-fourth of your plate with lean protein foods.  Eat 4-5 servings of vegetables per day. A serving of vegetables is: ? 1 cup of raw or cooked vegetables. ? 2 cups of raw leafy greens.  Eat 4-5 servings of fruit per day. A serving of fruit is: ? 1 medium whole fruit. ?  cup of dried fruit. ?  cup of fresh, frozen, or canned fruit. ?  cup of 100% fruit juice.  Eat more foods that have soluble fiber. These are apples, broccoli, carrots, beans, peas, and barley. Try to get 20-30 g of fiber per day.  Eat 4-5 servings of nuts, legumes, and seeds per week: ? 1 serving of dried beans or legumes equals  cup after being cooked. ? 1 serving of nuts is  cup. ? 1 serving of seeds equals 1 tablespoon. General information  Eat more home-cooked food. Eat less restaurant, buffet, and fast food.  Limit or avoid alcohol.  Limit foods that are high in starch and sugar.  Avoid fried foods.  Lose weight if you are overweight.  Keep track of how much salt  (sodium) you eat. This is important if you have high blood pressure. Ask your doctor to tell you more about this.  Try to add vegetarian meals each week. Fats  Choose healthy fats. These include olive oil and canola oil, flaxseeds, walnuts, almonds, and seeds.  Eat more omega-3 fats. These include salmon, mackerel, sardines, tuna, flaxseed oil, and ground flaxseeds. Try to eat fish at least 2 times each week.  Check food labels. Avoid foods with trans fats or high amounts of saturated fat.  Limit saturated fats. ? These are often found in animal products, such as meats, butter, and cream. ? These are also found in plant foods, such as palm oil, palm kernel oil, and coconut oil.  Avoid foods with partially hydrogenated oils in them. These have trans fats. Examples are stick margarine, some tub margarines, cookies, crackers, and other baked goods. What foods can I eat? Fruits All fresh, canned (in natural juice), or frozen fruits. Vegetables Fresh or frozen vegetables (raw, steamed, roasted, or grilled). Green salads. Grains Most grains. Choose whole wheat and whole grains most of the time. Rice and pasta, including brown rice and pastas made with whole wheat. Meats and other proteins Lean, well-trimmed beef, veal, pork, and lamb. Chicken and [place] without skin. All fish and shellfish. Wild duck, rabbit, pheasant, and venison. Egg whites or low-cholesterol egg substitutes. Dried beans, peas, lentils, and tofu. Seeds and most  nuts. Dairy Low-fat or nonfat cheeses, including ricotta and mozzarella. Skim or 1% milk that is liquid, powdered, or evaporated. Buttermilk that is made with low-fat milk. Nonfat or low-fat yogurt. Fats and oils Non-hydrogenated (trans-free) margarines. Vegetable oils, including soybean, sesame, sunflower, olive, peanut, safflower, corn, canola, and cottonseed. Salad dressings or mayonnaise made with a vegetable oil. Beverages Mineral water. Coffee and tea. Diet  carbonated beverages. Sweets and desserts Sherbet, gelatin, and fruit ice. Small amounts of dark chocolate. Limit all sweets and desserts. Seasonings and condiments All seasonings and condiments. The items listed above may not be a complete list of foods and drinks you can eat. Contact a dietitian for more options. What foods should I avoid? Fruits Canned fruit in heavy syrup. Fruit in cream or butter sauce. Fried fruit. Limit coconut. Vegetables Vegetables cooked in cheese, cream, or butter sauce. Fried vegetables. Grains Breads that are made with saturated or trans fats, oils, or whole milk. Croissants. Sweet rolls. Donuts. High-fat crackers, such as cheese crackers. Meats and other proteins Fatty meats, such as hot dogs, ribs, sausage, bacon, rib-eye roast or steak. High-fat deli meats, such as salami and bologna. Caviar. Domestic duck and goose. Organ meats, such as liver. Dairy Cream, sour cream, cream cheese, and creamed cottage cheese. Whole-milk cheeses. Whole or 2% milk that is liquid, evaporated, or condensed. Whole buttermilk. Cream sauce or high-fat cheese sauce. Yogurt that is made from whole milk. Fats and oils Meat fat, or shortening. Cocoa butter, hydrogenated oils, palm oil, coconut oil, palm kernel oil. Solid fats and shortenings, including bacon fat, salt pork, lard, and butter. Nondairy cream substitutes. Salad dressings with cheese or sour cream. Beverages Regular sodas and juice drinks with added sugar. Sweets and desserts Frosting. Pudding. Cookies. Cakes. Pies. Milk chocolate or white chocolate. Buttered syrups. Full-fat ice cream or ice cream drinks. The items listed above may not be a complete list of foods and drinks to avoid. Contact a dietitian for more information. Summary  Heart-healthy meal planning includes eating less unhealthy fats, eating more healthy fats, and making other changes in your diet.  Eat a balanced diet. This includes fruits and  vegetables, low-fat or nonfat dairy, lean protein, nuts and legumes, whole grains, and heart-healthy oils and fats. This information is not intended to replace advice given to you by your health care provider. Make sure you discuss any questions you have with your health care provider. Document Released: 04/06/2012 Document Revised: 11/13/2017 Document Reviewed: 11/13/2017 Elsevier Interactive Patient Education  2019 [place] [employer] Calories are units of energy. Your body needs a certain amount of calories from food to keep you going throughout the day. When you eat more calories than your body needs, your body stores the extra calories as fat. When you eat fewer calories than your body needs, your body burns fat to get the energy it needs. Calorie counting means keeping track of how many calories you eat and drink each day. Calorie counting can be helpful if you need to lose weight. If you make sure to eat fewer calories than your body needs, you should lose weight. Ask your health care provider what a healthy weight is for you. For calorie counting to work, you will need to eat the right number of calories in a day in order to lose a healthy amount of weight per week. A dietitian can help you determine how many calories you need in a day and will give you suggestions on how to  reach your calorie goal.  A healthy amount of weight to lose per week is usually 1-2 lb (0.5-0.9 kg). This usually means that your daily calorie intake should be reduced by 500-750 calories.  Eating 1,200 - 1,500 calories per day can help most women lose weight.  Eating 1,500 - 1,800 calories per day can help most men lose weight. What is my plan? My goal is to have __________ calories per day. If I have this many calories per day, I should lose around __________ pounds per week. What do I need to know about calorie counting? In order to meet your daily calorie goal, you will need to:  Find  out how many calories are in each food you would like to eat. Try to do this before you eat.  Decide how much of the food you plan to eat.  Write down what you ate and how many calories it had. Doing this is called keeping a food log. To successfully lose weight, it is important to balance calorie counting with a healthy lifestyle that includes regular activity. Aim for 150 minutes of moderate exercise (such as walking) or 75 minutes of vigorous exercise (such as running) each week. Where do I find calorie information?  The number of calories in a food can be found on a Nutrition Facts label. If a food does not have a Nutrition Facts label, try to look up the calories online or ask your dietitian for help. Remember that calories are listed per serving. If you choose to have more than one serving of a food, you will have to multiply the calories per serving by the amount of servings you plan to eat. For example, the label on a package of bread might say that a serving size is 1 slice and that there are 90 calories in a serving. If you eat 1 slice, you will have eaten 90 calories. If you eat 2 slices, you will have eaten 180 calories. How do I keep a food log? Immediately after each meal, record the following information in your food log:  What you ate. Don't forget to include toppings, sauces, and other extras on the food.  How much you ate. This can be measured in cups, ounces, or number of items.  How many calories each food and drink had.  The total number of calories in the meal. Keep your food log near you, such as in a small notebook in your pocket, or use a mobile app or website. Some programs will calculate calories for you and show you how many calories you have left for the day to meet your goal. What are some calorie counting tips?   Use your calories on foods and drinks that will fill you up and not leave you hungry: ? Some examples of foods that fill you up are nuts and nut  butters, vegetables, lean proteins, and high-fiber foods like whole grains. High-fiber foods are foods with more than 5 g fiber per serving. ? Drinks such as sodas, specialty coffee drinks, alcohol, and juices have a lot of calories, yet do not fill you up.  Eat nutritious foods and avoid empty calories. Empty calories are calories you get from foods or beverages that do not have many vitamins or protein, such as candy, sweets, and soda. It is better to have a nutritious high-calorie food (such as an avocado) than a food with few nutrients (such as a bag of chips).  Know how many calories are in the foods  you eat most often. This will help you calculate calorie counts faster.  Pay attention to calories in drinks. Low-calorie drinks include water and unsweetened drinks.  Pay attention to nutrition labels for "low fat" or "fat free" foods. These foods sometimes have the same amount of calories or more calories than the full fat versions. They also often have added sugar, starch, or salt, to make up for flavor that was removed with the fat.  Find a way of tracking calories that works for you. Get creative. Try different apps or programs if writing down calories does not work for you. What are some portion control tips?  Know how many calories are in a serving. This will help you know how many servings of a certain food you can have.  Use a measuring cup to measure serving sizes. You could also try weighing out portions on a kitchen scale. With time, you will be able to estimate serving sizes for some foods.  Take some time to put servings of different foods on your favorite plates, bowls, and cups so you know what a serving looks like.  Try not to eat straight from a bag or box. Doing this can lead to overeating. Put the amount you would like to eat in a cup or on a plate to make sure you are eating the right portion.  Use smaller plates, glasses, and bowls to prevent overeating.  Try not to  multitask (for example, watch TV or use your computer) while eating. If it is time to eat, sit down at a table and enjoy your food. This will help you to know when you are full. It will also help you to be aware of what you are eating and how much you are eating. What are tips for following this plan? Reading food labels  Check the calorie count compared to the serving size. The serving size may be smaller than what you are used to eating.  Check the source of the calories. Make sure the food you are eating is high in vitamins and protein and low in saturated and trans fats. Shopping  Read nutrition labels while you shop. This will help you make healthy decisions before you decide to purchase your food.  Make a grocery list and stick to it. Cooking  Try to cook your favorite foods in a healthier way. For example, try baking instead of frying.  Use low-fat dairy products. Meal planning  Use more fruits and vegetables. Half of your plate should be fruits and vegetables.  Include lean proteins like poultry and fish. How do I count calories when eating out?  Ask for smaller portion sizes.  Consider sharing an entree and sides instead of getting your own entree.  If you get your own entree, eat only half. Ask for a box at the beginning of your meal and put the rest of your entree in it so you are not tempted to eat it.  If calories are listed on the menu, choose the lower calorie options.  Choose dishes that include vegetables, fruits, whole grains, low-fat dairy products, and lean protein.  Choose items that are boiled, broiled, grilled, or steamed. Stay away from items that are buttered, battered, fried, or served with cream sauce. Items labeled "crispy" are usually fried, unless stated otherwise.  Choose water, low-fat milk, unsweetened iced tea, or other drinks without added sugar. If you want an alcoholic beverage, choose a lower calorie option such as a glass of wine or light  beer.  Ask for dressings, sauces, and syrups on the side. These are usually high in calories, so you should limit the amount you eat.  If you want a salad, choose a garden salad and ask for grilled meats. Avoid extra toppings like bacon, cheese, or fried items. Ask for the dressing on the side, or ask for olive oil and vinegar or lemon to use as dressing.  Estimate how many servings of a food you are given. For example, a serving of cooked rice is  cup or about the size of half a baseball. Knowing serving sizes will help you be aware of how much food you are eating at restaurants. The list below tells you how big or small some common portion sizes are based on everyday objects: ? 1 oz--4 stacked dice. ? 3 oz--1 deck of cards. ? 1 tsp--1 die. ? 1 Tbsp-- a ping-pong ball. ? 2 Tbsp--1 ping-pong ball. ?  cup-- baseball. ? 1 cup--1 baseball. Summary  Calorie counting means keeping track of how many calories you eat and drink each day. If you eat fewer calories than your body needs, you should lose weight.  A healthy amount of weight to lose per week is usually 1-2 lb (0.5-0.9 kg). This usually means reducing your daily calorie intake by 500-750 calories.  The number of calories in a food can be found on a Nutrition Facts label. If a food does not have a Nutrition Facts label, try to look up the calories online or ask your dietitian for help.  Use your calories on foods and drinks that will fill you up, and not on foods and drinks that will leave you hungry.  Use smaller plates, glasses, and bowls to prevent overeating. This information is not intended to replace advice given to you by your health care provider. Make sure you discuss any questions you have with your health care provider. Document Released: 10/06/2005 Document Revised: 06/25/2018 Document Reviewed: 09/05/2016 Elsevier Interactive Patient Education  2019 [place] DASH stands for "Dietary Approaches to  Stop Hypertension." The DASH eating plan is a healthy eating plan that has been shown to reduce high blood pressure (hypertension). It may also reduce your risk for type 2 diabetes, heart disease, and stroke. The DASH eating plan may also help with weight loss. What are tips for following this plan?  General guidelines  Avoid eating more than 2,300 mg (milligrams) of salt (sodium) a day. If you have hypertension, you may need to reduce your sodium intake to 1,500 mg a day.  Limit alcohol intake to no more than 1 drink a day for nonpregnant women and 2 drinks a day for men. One drink equals 12 oz of beer, 5 oz of wine, or 1 oz of hard liquor.  Work with your health care provider to maintain a healthy body weight or to lose weight. Ask what an ideal weight is for you.  Get at least 30 minutes of exercise that causes your heart to beat faster (aerobic exercise) most days of the week. Activities may include walking, swimming, or biking.  Work with your health care provider or diet and nutrition specialist (dietitian) to adjust your eating plan to your individual calorie needs. Reading food labels   Check food labels for the amount of sodium per serving. Choose foods with less than 5 percent of the Daily Value of sodium. Generally, foods with less than 300 mg of sodium per serving fit into this eating plan.  To  find whole grains, look for the word "whole" as the first word in the ingredient list. Shopping  Buy products labeled as "low-sodium" or "no salt added."  Buy fresh foods. Avoid canned foods and premade or frozen meals. Cooking  Avoid adding salt when cooking. Use salt-free seasonings or herbs instead of table salt or sea salt. Check with your health care provider or pharmacist before using salt substitutes.  Do not fry foods. Cook foods using healthy methods such as baking, boiling, grilling, and broiling instead.  Cook with heart-healthy oils, such as olive, canola, soybean, or  sunflower oil. Meal planning  Eat a balanced diet that includes: ? 5 or more servings of fruits and vegetables each day. At each meal, try to fill half of your plate with fruits and vegetables. ? Up to 6-8 servings of whole grains each day. ? Less than 6 oz of lean meat, poultry, or fish each day. A 3-oz serving of meat is about the same size as a deck of cards. One egg equals 1 oz. ? 2 servings of low-fat dairy each day. ? A serving of nuts, seeds, or beans 5 times each week. ? Heart-healthy fats. Healthy fats called Omega-3 fatty acids are found in foods such as flaxseeds and coldwater fish, like sardines, salmon, and mackerel.  Limit how much you eat of the following: ? Canned or prepackaged foods. ? Food that is high in trans fat, such as fried foods. ? Food that is high in saturated fat, such as fatty meat. ? Sweets, desserts, sugary drinks, and other foods with added sugar. ? Full-fat dairy products.  Do not salt foods before eating.  Try to eat at least 2 vegetarian meals each week.  Eat more home-cooked food and less restaurant, buffet, and fast food.  When eating at a restaurant, ask that your food be prepared with less salt or no salt, if possible. What foods are recommended? The items listed may not be a complete list. Talk with your dietitian about what dietary choices are best for you. Grains Whole-grain or whole-wheat bread. Whole-grain or whole-wheat pasta. Brown rice. [place]. Bulgur. Whole-grain and low-sodium cereals. Pita bread. Low-fat, low-sodium crackers. Whole-wheat flour tortillas. Vegetables Fresh or frozen vegetables (raw, steamed, roasted, or grilled). Low-sodium or reduced-sodium tomato and vegetable juice. Low-sodium or reduced-sodium tomato sauce and tomato paste. Low-sodium or reduced-sodium canned vegetables. Fruits All fresh, dried, or frozen fruit. Canned fruit in natural juice (without added sugar). Meat and other protein foods Skinless  chicken or [place]. Ground chicken or [place]. Pork with fat trimmed off. Fish and seafood. Egg whites. Dried beans, peas, or lentils. Unsalted nuts, nut butters, and seeds. Unsalted canned beans. Lean cuts of beef with fat trimmed off. Low-sodium, lean deli meat. Dairy Low-fat (1%) or fat-free (skim) milk. Fat-free, low-fat, or reduced-fat cheeses. Nonfat, low-sodium ricotta or cottage cheese. Low-fat or nonfat yogurt. Low-fat, low-sodium cheese. Fats and oils Soft margarine without trans fats. Vegetable oil. Low-fat, reduced-fat, or light mayonnaise and salad dressings (reduced-sodium). Canola, safflower, olive, soybean, and sunflower oils. Avocado. Seasoning and other foods Herbs. Spices. Seasoning mixes without salt. Unsalted popcorn and pretzels. Fat-free sweets. What foods are not recommended? The items listed may not be a complete list. Talk with your dietitian about what dietary choices are best for you. Grains Baked goods made with fat, such as croissants, muffins, or some breads. Dry pasta or rice meal packs. Vegetables Creamed or fried vegetables. Vegetables in a cheese sauce. Regular canned vegetables (not low-sodium  or reduced-sodium). Regular canned tomato sauce and paste (not low-sodium or reduced-sodium). Regular tomato and vegetable juice (not low-sodium or reduced-sodium). Angie Fava. Olives. Fruits Canned fruit in a light or heavy syrup. Fried fruit. Fruit in cream or butter sauce. Meat and other protein foods Fatty cuts of meat. Ribs. Fried meat. Berniece Salines. Sausage. Bologna and other processed lunch meats. Salami. Fatback. Hotdogs. Bratwurst. Salted nuts and seeds. Canned beans with added salt. Canned or smoked fish. Whole eggs or egg yolks. Chicken or [place] with skin. Dairy Whole or 2% milk, cream, and half-and-half. Whole or full-fat cream cheese. Whole-fat or sweetened yogurt. Full-fat cheese. Nondairy creamers. Whipped toppings. Processed cheese and cheese spreads. Fats and  oils Butter. Stick margarine. Lard. Shortening. Ghee. Bacon fat. Tropical oils, such as coconut, palm kernel, or palm oil. Seasoning and other foods Salted popcorn and pretzels. Onion salt, garlic salt, seasoned salt, table salt, and sea salt. Worcestershire sauce. Tartar sauce. Barbecue sauce. Teriyaki sauce. Soy sauce, including reduced-sodium. Steak sauce. Canned and packaged gravies. Fish sauce. Oyster sauce. Cocktail sauce. Horseradish that you find on the shelf. Ketchup. Mustard. Meat flavorings and tenderizers. Bouillon cubes. Hot sauce and Tabasco sauce. Premade or packaged marinades. Premade or packaged taco seasonings. Relishes. Regular salad dressings. Where to find more information:  National Heart, Lung, and [place]: https://wilson-eaton.com/  American Heart Association: www.heart.org Summary  The DASH eating plan is a healthy eating plan that has been shown to reduce high blood pressure (hypertension). It may also reduce your risk for type 2 diabetes, heart disease, and stroke.  With the DASH eating plan, you should limit salt (sodium) intake to 2,300 mg a day. If you have hypertension, you may need to reduce your sodium intake to 1,500 mg a day.  When on the DASH eating plan, aim to eat more fresh fruits and vegetables, whole grains, lean proteins, low-fat dairy, and heart-healthy fats.  Work with your health care provider or diet and nutrition specialist (dietitian) to adjust your eating plan to your individual calorie needs. This information is not intended to replace advice given to you by your health care provider. Make sure you discuss any questions you have with your health care provider. Document Released: 09/25/2011 Document Revised: 09/29/2016 Document Reviewed: 09/29/2016 Elsevier Interactive Patient Education  2019 [employer].

## 2019-01-28 ENCOUNTER — Ambulatory Visit: Payer: BLUE CROSS/BLUE SHIELD | Admitting: Family Medicine

## 2019-02-24 NOTE — Telephone Encounter (Signed)
Message sent to provider 

## 2019-03-17 ENCOUNTER — Telehealth: Payer: Self-pay | Admitting: Nurse Practitioner

## 2019-03-17 NOTE — Telephone Encounter (Signed)
Called patient about her appointment on June 8 with Dr. Acie Fredrickson. She denies complaints and gives verbal consent for virtual visit. She verbalized understanding of instructions and I advised her to call the office with questions or concerns prior to that time. She thanked me for the call.   YOUR CARDIOLOGY TEAM HAS ARRANGED FOR AN E-VISIT FOR YOUR APPOINTMENT - PLEASE REVIEW IMPORTANT INFORMATION BELOW SEVERAL DAYS PRIOR TO YOUR APPOINTMENT  Due to the recent COVID-19 pandemic, we are transitioning in-person office visits to tele-medicine visits in an effort to decrease unnecessary exposure to our patients, their families, and staff. These visits are billed to your insurance just like a normal visit is. We also encourage you to sign up for MyChart if you have not already done so. You will need a smartphone if possible. For patients that do not have this, we can still complete the visit using a regular telephone but do prefer a smartphone to enable video when possible. You may have a family member that lives with you that can help. If possible, we also ask that you have a blood pressure cuff and scale at home to measure your blood pressure, heart rate and weight prior to your scheduled appointment. Patients with clinical needs that need an in-person evaluation and testing will still be able to come to the office if absolutely necessary. If you have any questions, feel free to call our office.   YOUR PROVIDER WILL BE USING THE FOLLOWING PLATFORM TO COMPLETE YOUR VISIT: Doxy.Me   . IF USING DOXIMITY or [email or web address].ME - The staff will give you instructions on receiving your link to join the meeting the day of your visit.     2-3 DAYS BEFORE YOUR APPOINTMENT  You will receive a telephone call from one of our [employer] team members - your caller ID may say "Unknown caller." If this is a video visit, we will walk you through how to get the video launched on your phone. We will remind you check your blood pressure,  heart rate and weight prior to your scheduled appointment. If you have an Apple Watch or Kardia, please upload any pertinent ECG strips the day before or morning of your appointment to [place]. Our staff will also make sure you have reviewed the consent and agree to move forward with your scheduled tele-health visit.    THE DAY OF YOUR APPOINTMENT  Approximately 15 minutes prior to your scheduled appointment, you will receive a telephone call from one of [facility] team - your caller ID may say "Unknown caller."  Our staff will confirm medications, vital signs for the day and any symptoms you may be experiencing. Please have this information available prior to the time of visit start. It may also be helpful for you to have a pad of paper and pen handy for any instructions given during your visit. They will also walk you through joining the smartphone meeting if this is a video visit.   CONSENT FOR TELE-HEALTH VISIT - PLEASE REVIEW  I hereby voluntarily request, consent and authorize [facility] and its employed or contracted physicians, physician assistants, nurse practitioners or other licensed health care professionals (the Practitioner), to provide me with telemedicine health care services (the "Services") as deemed necessary by the treating Practitioner. I acknowledge and consent to receive the Services by the Practitioner via telemedicine. I understand that the telemedicine visit will involve communicating with the Practitioner through live audiovisual communication technology and the disclosure of certain medical information by electronic transmission. I  acknowledge that I have been given the opportunity to request an in-person assessment or other available alternative prior to the telemedicine visit and am voluntarily participating in the telemedicine visit.  I understand that I have the right to withhold or withdraw my consent to the use of telemedicine in the course of my care at any time,  without affecting my right to future care or treatment, and that the Practitioner or I may terminate the telemedicine visit at any time. I understand that I have the right to inspect all information obtained and/or recorded in the course of the telemedicine visit and may receive copies of available information for a reasonable fee.  I understand that some of the potential risks of receiving the Services via telemedicine include:  Marland Kitchen Delay or interruption in medical evaluation due to technological equipment failure or disruption; . Information transmitted may not be sufficient (e.g. poor resolution of images) to allow for appropriate medical decision making by the Practitioner; and/or  . In rare instances, security protocols could fail, causing a breach of personal health information.  Furthermore, I acknowledge that it is my responsibility to provide information about my medical history, conditions and care that is complete and accurate to the best of my ability. I acknowledge that Practitioner's advice, recommendations, and/or decision may be based on factors not within their control, such as incomplete or inaccurate data provided by me or distortions of diagnostic images or specimens that may result from electronic transmissions. I understand that the practice of medicine is not an exact science and that Practitioner makes no warranties or guarantees regarding treatment outcomes. I acknowledge that I will receive a copy of this consent concurrently upon execution via email to the email address I last provided but may also request a printed copy by calling the office of [facility].    I understand that my insurance will be billed for this visit.   I have read or had this consent read to me. . I understand the contents of this consent, which adequately explains the benefits and risks of the Services being provided via telemedicine.  . I have been provided ample opportunity to ask questions regarding  this consent and the Services and have had my questions answered to my satisfaction. . I give my informed consent for the services to be provided through the use of telemedicine in my medical care  By participating in this telemedicine visit I agree to the above.

## 2019-03-28 ENCOUNTER — Encounter: Payer: Self-pay | Admitting: Cardiovascular Disease

## 2019-03-28 ENCOUNTER — Telehealth (INDEPENDENT_AMBULATORY_CARE_PROVIDER_SITE_OTHER): Payer: BLUE CROSS/BLUE SHIELD | Admitting: Cardiovascular Disease

## 2019-03-28 ENCOUNTER — Other Ambulatory Visit: Payer: Self-pay

## 2019-03-28 VITALS — BP 157/62 | HR 75 | Ht 65.0 in | Wt 313.0 lb

## 2019-03-28 DIAGNOSIS — I5042 Chronic combined systolic (congestive) and diastolic (congestive) heart failure: Secondary | ICD-10-CM | POA: Diagnosis not present

## 2019-03-28 DIAGNOSIS — I10 Essential (primary) hypertension: Secondary | ICD-10-CM

## 2019-03-28 DIAGNOSIS — Z7189 Other specified counseling: Secondary | ICD-10-CM

## 2019-03-28 DIAGNOSIS — I2510 Atherosclerotic heart disease of native coronary artery without angina pectoris: Secondary | ICD-10-CM

## 2019-03-28 DIAGNOSIS — E7800 Pure hypercholesterolemia, unspecified: Secondary | ICD-10-CM

## 2019-03-28 DIAGNOSIS — E782 Mixed hyperlipidemia: Secondary | ICD-10-CM

## 2019-03-28 MED ORDER — SPIRONOLACTONE 25 MG PO TABS: 25.0000 mg | ORAL_TABLET | Freq: Every day | ORAL | 3 refills | Status: AC

## 2019-03-28 MED ORDER — ATORVASTATIN CALCIUM 80 MG PO TABS: 80.0000 mg | ORAL_TABLET | Freq: Every day | ORAL | 1 refills | Status: DC

## 2019-03-28 NOTE — Progress Notes (Signed)
Virtual Visit via Video Note   This visit type was conducted due to national recommendations for restrictions regarding the COVID-19 Pandemic (e.g. social distancing) in an effort to limit this patient's exposure and mitigate transmission in our community.  Due to her co-morbid illnesses, this patient is at least at moderate risk for complications without adequate follow up.  This format is felt to be most appropriate for this patient at this time.  All issues noted in this document were discussed and addressed.  A limited physical exam was performed with this format.  Please refer to the patient's chart for her consent to telehealth for [facility].   Date:  03/28/2019   ID:  Brittney Ross, DOB [id number], MRN [id number]  Patient Location: Home Provider Location: Home  PCP:  Azzie Glatter, FNP  Cardiologist:  Mertie Moores, MD  Electrophysiologist:  None   Evaluation Performed:  Follow-Up Visit  Problem list  1.  Coronary artery disease-mild coronary artery disease by heart cath September, 2018 2.  Chronic combined systolic and diastolic congestive heart failure 3.  Hyperlipidemia 4.  Essential hypertension 5.  Morbid obesity 6.  Hypothyroidism     Chief Complaint  Patient presents with  . Congestive Heart Failure        Brittney Ross is a 58 y.o. female with a hx of chronic combined systolic and diastolic congestive heart failure.  She was seen in October by Richardson Dopp.  She is done well on Entresto and carvedilol.  Her Lasix has been changed to as needed.  She is on spironolactone 12.5 mg a day.  Echocardiogram in December, 2018 reveals improvement of her left ventricular systolic function.  Her left ventricular ejection fraction is now 30-35%.  She has grade 1 diastolic dysfunction.  This ejection fraction is improved from the echocardiogram from September, 2018 when her ejection fraction was 20-25%.  She is watching her diet.  BP has continued to be  mildly elevated  December 27, 2018: Brinlyn is seen today for follow-up of her chronic combined systolic and diastolic congestive heart failure.  She is been on Entresto and carvedilol.  She has had some slight improvement of her left ventricular function by echo. Breathing has improved.   Wt today is 312 lbs.   Up 4 lbs from Jan  Chief Complaint:  CHF   March 28, 2019    Brittney Ross is a 58 y.o. female with CHF . Doing well .  Tolerating the high dose of entresto Breathing is improving . No cp, no dyspnea.   Walking at work BP is a bit elevated.  157/62    The patient does not have symptoms concerning for COVID-19 infection (fever, chills, cough, or new shortness of breath).    Past Medical History:  Diagnosis Date  . Anemia   . Chronic combined systolic and diastolic heart failure (HCC) 07/05/2017   Non-ischemic CM // Echo 9/18:EF 20-25, severe diff HK, Gr 1 DD, mild MR, mod LAE // R/L Heart Cath 9/18: pD1 40, mD1 30; o/w normal cors // Echo 5/19: EF 35-40, trivial AI, mildly dilated aortic root (38 mm), MAC, mild LAE   . Hyperlipidemia   . Hypertension    Pt states she "doesn't have HTN"  . Morbid obesity ([place])   . Thyroid disease hyperthyroidism   Past Surgical History:  Procedure Laterality Date  . CESAREAN SECTION  12/07/79  . CESAREAN SECTION  05/17/81  . CESAREAN SECTION  09/06/89  .  RATH/BSO/cystoscopy    . RIGHT/LEFT HEART CATH AND CORONARY ANGIOGRAPHY N/A 07/09/2017   Procedure: RIGHT/LEFT HEART CATH AND CORONARY ANGIOGRAPHY;  Surgeon: Troy Sine, MD;  Location: [facility];  Service: Cardiovascular;  Laterality: N/A;  . TUBAL LIGATION       Current Meds  Medication Sig  . albuterol (PROVENTIL HFA;VENTOLIN HFA) 108 (90 Base) MCG/ACT inhaler Inhale 2 puffs into the lungs every 6 (six) hours as needed for wheezing or shortness of breath.  Marland Kitchen aspirin EC 81 MG tablet Take 81 mg by mouth daily.  Marland Kitchen atorvastatin (LIPITOR) 80 MG tablet Take 1 tablet (80 mg  total) by mouth daily.  . carvedilol (COREG) 12.5 MG tablet Take 1 tablet (12.5 mg total) by mouth 2 (two) times daily.  . furosemide (LASIX) 20 MG tablet Take 1 tablet (20 mg total) by mouth daily as needed for fluid or edema.  Marland Kitchen levothyroxine (SYNTHROID, LEVOTHROID) 125 MCG tablet Take 1 tablet (125 mcg total) by mouth daily.  . sacubitril-valsartan (ENTRESTO) 97-103 MG Take 1 tablet by mouth 2 (two) times daily.  Marland Kitchen spironolactone (ALDACTONE) 25 MG tablet TAKE 1/2 (ONE-HALF) TABLET BY MOUTH ONCE DAILY  . [DISCONTINUED] atorvastatin (LIPITOR) 80 MG tablet Take 1 tablet (80 mg total) by mouth daily.     Allergies:   Patient has no known allergies.   Social History   Tobacco Use  . Smoking status: Never Smoker  . Smokeless tobacco: Never Used  Substance Use Topics  . Alcohol use: No  . Drug use: No     Family Hx: The patient's family history includes Heart disease in her mother; Hypertension in her father and sister.  ROS:   Please see the history of present illness.     All other systems reviewed and are negative.   Prior CV studies:   The following studies were reviewed today:    Labs/Other Tests and Data Reviewed:    EKG:  No ECG reviewed.  Recent Labs: 06/25/2018: Magnesium 2.0 12/23/2018: TSH 3.77 12/27/2018: ALT 13; BUN 14; Creatinine, Ser 0.77; Potassium 4.1; Sodium 143   Recent Lipid Panel Lab Results  Component Value Date/Time   CHOL 185 12/27/2018 10:53 AM   TRIG 86 12/27/2018 10:53 AM   HDL 47 12/27/2018 10:53 AM   CHOLHDL 3.9 12/27/2018 10:53 AM   CHOLHDL 7.9 07/10/2017 04:19 AM   LDLCALC 121 (H) 12/27/2018 10:53 AM    Wt Readings from Last 3 Encounters:  03/28/19 (!) 313 lb (142 kg)  01/25/19 (!) 317 lb (143.8 kg)  12/27/18 (!) 312 lb (141.5 kg)     Objective:    Vital Signs:  BP (!) 157/62 (BP Location: Left Arm, Patient Position: Sitting, Cuff Size: Normal)   Pulse 75   Ht _0  (1.651 m)   Wt (!) 313 lb (142 kg)   LMP 10/28/2011   BMI 52.09  kg/m    VITAL SIGNS:  reviewed GEN:  no acute distress EYES:  sclerae anicteric, EOMI - Extraocular Movements Intact RESPIRATORY:  normal respiratory effort, symmetric expansion CARDIOVASCULAR:  no peripheral edema SKIN:  no rash, lesions or ulcers. MUSCULOSKELETAL:  no obvious deformities. NEURO:  alert and oriented x 3, no obvious focal deficit PSYCH:  normal affect  ASSESSMENT & PLAN:    1. Chronic systolic CHF: But he seems to be doing well.  She is not having any chest pain or shortness of breath.  She has gained a little weight.  I encouraged her to watch her  diet.  Her blood pressure is little elevated today.  We will increase the spironolactone to 25 mg a day.  We will check a basic metabolic profile in 2 to 3 weeks.  I will plan on seeing her back in the office in 2 to 3 months.  Hypertension: She still eats out a fair amount.  Have encouraged her to watch her salt more.  Of also encouraged her to lose some weight.  With the intention of increasing  3.  Hyperlipidemia: Continue atorvastatin.  Will check labs at her next office visit.  4.  Coronary artery disease.  She is not had any episodes of angina.  Continue atorvastatin.  Continue weight loss and blood pressure control.  COVID-19 Education: The signs and symptoms of COVID-19 were discussed with the patient and how to seek care for testing (follow up with PCP or arrange E-visit).  The importance of social distancing was discussed today.  Time:   Today, I have spent  25 minutes  with the patient with telehealth technology discussing the above problems.     Medication Adjustments/Labs and Tests Ordered: Current medicines are reviewed at length with the patient today.  Concerns regarding medicines are outlined above.   Tests Ordered: Orders Placed This Encounter  Procedures  . Basic Metabolic Panel (BMET)    Medication Changes: Meds ordered this encounter  Medications  . atorvastatin (LIPITOR) 80 MG tablet    Sig:  Take 1 tablet (80 mg total) by mouth daily.    Dispense:  90 tablet    Refill:  1    Please consider 90 day supplies to promote better adherence  . spironolactone (ALDACTONE) 25 MG tablet    Sig: Take 1 tablet (25 mg total) by mouth daily.    Dispense:  90 tablet    Refill:  3    Disposition:  Follow up in 3 month(s)  Signed, Mertie Moores, MD  03/28/2019 10:26 AM    [facility]

## 2019-03-28 NOTE — Patient Instructions (Addendum)
Medication Instructions:  Your physician has recommended you make the following change in your medication:  INCREASE Spironolactone (Aldactone) to 25 mg once daily  If you need a refill on your cardiac medications before your next appointment, please call your pharmacy.    Lab work: Your physician recommends that you return to our office for lab work in: 2 weeks on Wednesday June 24th for basic metabolic panel You do not have to fast for this appointment You may come in anytime between 7:30 am and 4:30 pm and will need to check in at the front desk Please call to reschedule if this date does not work for you   Testing/Procedures: None Ordered    Follow-Up: Your physician recommends that you return for a follow-up appointment on Tuesday August 25 at the [facility] office with Dr. Acie Fredrickson at 11:40 am Please call to reschedule if you are unable to keep your appointment

## 2019-04-11 ENCOUNTER — Telehealth: Payer: Self-pay | Admitting: *Deleted

## 2019-04-11 NOTE — Telephone Encounter (Signed)
    COVID-19 Pre-Screening Questions:  . In the past 7 to 10 days have you had a cough,  shortness of breath, headache, congestion, fever (100 or greater) body aches, chills, sore throat, or sudden loss of taste or sense of smell? . Have you been around anyone with known Covid 19. . Have you been around anyone who is awaiting Covid 19 test results in the past 7 to 10 days? . Have you been around anyone who has been exposed to Covid 19, or has mentioned symptoms of Covid 19 within the past 7 to 10 days?  If you have any concerns/questions about symptoms patients report during screening (either on the phone or at [facility]). Contact the provider seeing the patient or DOD for further guidance.  If neither are available contact a member of the leadership team.          Contacted patient via telephone call. NO to all Covid 19 questions and has a mask. KB

## 2019-04-13 ENCOUNTER — Other Ambulatory Visit: Payer: BLUE CROSS/BLUE SHIELD | Admitting: *Deleted

## 2019-04-13 ENCOUNTER — Other Ambulatory Visit: Payer: Self-pay

## 2019-04-13 DIAGNOSIS — I5042 Chronic combined systolic (congestive) and diastolic (congestive) heart failure: Secondary | ICD-10-CM

## 2019-04-13 LAB — BASIC METABOLIC PANEL
BUN/Creatinine Ratio: 16 (ref 9–23)
BUN: 13 mg/dL (ref 6–24)
CO2: 24 mmol/L (ref 20–29)
Calcium: 9.7 mg/dL (ref 8.7–10.2)
Chloride: 101 mmol/L (ref 96–106)
Creatinine, Ser: 0.83 mg/dL (ref 0.57–1.00)
GFR calc Af Amer: 91 mL/min/{1.73_m2} (ref >59)
GFR calc non Af Amer: 79 mL/min/{1.73_m2} (ref >59)
Glucose: 120 mg/dL — ABNORMAL HIGH (ref 65–99)
Potassium: 3.9 mmol/L (ref 3.5–5.2)
Sodium: 139 mmol/L (ref 134–144)

## 2019-04-26 ENCOUNTER — Ambulatory Visit: Payer: BLUE CROSS/BLUE SHIELD | Admitting: Family Medicine

## 2019-04-29 ENCOUNTER — Ambulatory Visit (INDEPENDENT_AMBULATORY_CARE_PROVIDER_SITE_OTHER): Payer: BLUE CROSS/BLUE SHIELD | Admitting: Family Medicine

## 2019-04-29 ENCOUNTER — Encounter: Payer: Self-pay | Admitting: Family Medicine

## 2019-04-29 ENCOUNTER — Other Ambulatory Visit: Payer: Self-pay

## 2019-04-29 VITALS — BP 140/88 | HR 77 | Temp 98.2°F | Ht 65.0 in | Wt 318.0 lb

## 2019-04-29 DIAGNOSIS — E7849 Other hyperlipidemia: Secondary | ICD-10-CM | POA: Diagnosis not present

## 2019-04-29 DIAGNOSIS — Z09 Encounter for follow-up examination after completed treatment for conditions other than malignant neoplasm: Secondary | ICD-10-CM

## 2019-04-29 DIAGNOSIS — E890 Postprocedural hypothyroidism: Secondary | ICD-10-CM | POA: Diagnosis not present

## 2019-04-29 DIAGNOSIS — I10 Essential (primary) hypertension: Secondary | ICD-10-CM | POA: Diagnosis not present

## 2019-04-29 DIAGNOSIS — Z6841 Body Mass Index (BMI) 40.0 and over, adult: Secondary | ICD-10-CM

## 2019-04-29 DIAGNOSIS — E66813 Obesity, class 3: Secondary | ICD-10-CM

## 2019-04-29 LAB — POCT URINALYSIS DIP (MANUAL ENTRY)
Blood, UA: NEGATIVE
Glucose, UA: NEGATIVE mg/dL
Ketones, POC UA: NEGATIVE mg/dL
Leukocytes, UA: NEGATIVE
Nitrite, UA: NEGATIVE
Spec Grav, UA: >=1.03 — AB (ref 1.010–1.025)
Urobilinogen, UA: 2 E.U./dL — AB
pH, UA: 5 (ref 5.0–8.0)

## 2019-04-29 MED ORDER — ATORVASTATIN CALCIUM 80 MG PO TABS: 80.0000 mg | ORAL_TABLET | Freq: Every day | ORAL | 1 refills | Status: AC

## 2019-04-29 NOTE — Progress Notes (Signed)
i  [name]   Established Patient Office Visit  Subjective:  Patient ID: Brittney Ross, female    DOB: [birth year]  Age: 58 y.o. MRN: [id number]  CC:  Chief Complaint  Patient presents with  . Follow-up    chronic condtion     HPI Brittney Ross is a 58 year old female who presents for follow up today.  Past Medical History:  Diagnosis Date  . Anemia   . Chronic combined systolic and diastolic heart failure (HCC) 07/05/2017   Non-ischemic CM // Echo 9/18:EF 20-25, severe diff HK, Gr 1 DD, mild MR, mod LAE // R/L Heart Cath 9/18: pD1 40, mD1 30; o/w normal cors // Echo 5/19: EF 35-40, trivial AI, mildly dilated aortic root (38 mm), MAC, mild LAE   . Hyperlipidemia   . Hypertension    Pt states she "doesn't have HTN"  . Morbid obesity ([place])   . Thyroid disease hyperthyroidism   Current Status: Since her last office visit, he is doing well with no complaints. She continues to follow up with Dr. Cruzita Lederer at [facility] for Hypothyroidism. She denies visual changes, chest pain, cough, shortness of breath, heart palpitations, and falls. She has occasional headaches and dizziness with position changes. Denies severe headaches, confusion, seizures, double vision, and blurred vision, nausea and vomiting. She continues to work towards her weight loss goal by decreasing fried foods, eating healthier and increasing her activity. He weight has remained stable since her last office visit.   She denies fevers, chills, fatigue, recent infections, weight loss, and night sweats. No reports of GI problems such as diarrhea, and constipation. She has no reports of blood in stools, dysuria and hematuria. No depression or anxiety reported today. She denies pain today.    Past Surgical History:  Procedure Laterality Date  . CESAREAN SECTION  12/07/79  . CESAREAN SECTION  05/17/81  . CESAREAN SECTION  09/06/89  . RATH/BSO/cystoscopy    .  RIGHT/LEFT HEART CATH AND CORONARY ANGIOGRAPHY N/A 07/09/2017   Procedure: RIGHT/LEFT HEART CATH AND CORONARY ANGIOGRAPHY;  Surgeon: Troy Sine, MD;  Location: [place] [facility];  Service: Cardiovascular;  Laterality: N/A;  . TUBAL LIGATION      Family History  Problem Relation Age of Onset  . Hypertension Father   . Heart disease Mother   . Hypertension Sister     Social History   Socioeconomic History  . Marital status: Married    Spouse name: Not on file  . Number of children: Not on file  . Years of education: Not on file  . Highest education level: Not on file  Occupational History  . Not on file  Social Needs  . Financial resource strain: Not on file  . Food insecurity    Worry: Not on file    Inability: Not on file  . Transportation needs    Medical: Not on file    Non-medical: Not on file  Tobacco Use  . Smoking status: Never Smoker  . Smokeless tobacco: Never Used  Substance and Sexual Activity  . Alcohol use: No  . Drug use: No  . Sexual activity: Yes    Birth control/protection: Surgical  Lifestyle  . Physical activity    Days per week: Not on file    Minutes per session: Not on file  . Stress: Not on file  Relationships  . Social connections    Talks on phone: Not on file  Gets together: Not on file    Attends religious service: Not on file    Active member of club or organization: Not on file    Attends meetings of clubs or organizations: Not on file    Relationship status: Not on file  . Intimate partner violence    Fear of current or ex partner: Not on file    Emotionally abused: Not on file    Physically abused: Not on file    Forced sexual activity: Not on file  Other Topics Concern  . Not on file  Social History Narrative  . Not on file    Outpatient Medications Prior to Visit  Medication Sig Dispense Refill  . albuterol (PROVENTIL HFA;VENTOLIN HFA) 108 (90 Base) MCG/ACT inhaler Inhale 2 puffs into the lungs every 6 (six) hours  as needed for wheezing or shortness of breath. 8 g 12  . aspirin EC 81 MG tablet Take 81 mg by mouth daily.    Marland Kitchen atorvastatin (LIPITOR) 80 MG tablet Take 1 tablet (80 mg total) by mouth daily. 90 tablet 1  . carvedilol (COREG) 12.5 MG tablet Take 1 tablet (12.5 mg total) by mouth 2 (two) times daily. 180 tablet 3  . furosemide (LASIX) 20 MG tablet Take 1 tablet (20 mg total) by mouth daily as needed for fluid or edema. 90 tablet 3  . levothyroxine (SYNTHROID, LEVOTHROID) 125 MCG tablet Take 1 tablet (125 mcg total) by mouth daily. 90 tablet 3  . sacubitril-valsartan (ENTRESTO) 97-103 MG Take 1 tablet by mouth 2 (two) times daily. 60 tablet 11  . spironolactone (ALDACTONE) 25 MG tablet Take 1 tablet (25 mg total) by mouth daily. 90 tablet 3  . spironolactone (ALDACTONE) 25 MG tablet TAKE 1/2 (ONE-HALF) TABLET BY MOUTH ONCE DAILY (Patient taking differently: 25 mg. ) 45 tablet 2   No facility-administered medications prior to visit.     No Known Allergies  ROS Review of Systems  Constitutional: Negative.   HENT: Negative.   Eyes: Negative.   Respiratory: Negative.   Cardiovascular: Negative.   Gastrointestinal: Positive for abdominal distention (obese).  Endocrine: Negative.   Genitourinary: Negative.   Musculoskeletal: Negative.   Skin: Negative.   Neurological: Positive for dizziness (occasional ) and headaches (occasional).  Hematological: Negative.   Psychiatric/Behavioral: Negative.       Objective:    Physical Exam  Constitutional: She is oriented to person, place, and time. She appears well-developed and well-nourished.  HENT:  Head: Normocephalic and atraumatic.  Eyes: Conjunctivae are normal.  Neck: Normal range of motion. Neck supple.  Cardiovascular: Normal rate, normal heart sounds and intact distal pulses.  Pulmonary/Chest: Effort normal and breath sounds normal.  Abdominal: Soft. Bowel sounds are normal.  Musculoskeletal: Normal range of motion.  Neurological:  She is alert and oriented to person, place, and time.  Skin: Skin is warm and dry.  Psychiatric: She has a normal mood and affect. Her behavior is normal. Judgment and thought content normal.  Nursing note and vitals reviewed.   BP 140/88   Pulse 77   Temp 98.2 F (36.8 C) (Oral)   Ht _0  (1.651 m)   Wt (!) 318 lb (144.2 kg)   LMP 10/28/2011   SpO2 100%   BMI 52.92 kg/m  Wt Readings from Last 3 Encounters:  04/29/19 (!) 318 lb (144.2 kg)  03/28/19 (!) 313 lb (142 kg)  01/25/19 (!) 317 lb (143.8 kg)     Health Maintenance Due  Topic Date Due  .  Hepatitis C Screening  May 23, 1961  . COLONOSCOPY  11/23/2011  . MAMMOGRAM  01/04/2014  . PAP SMEAR-Modifier  07/16/2014    There are no preventive care reminders to display for this patient.  Lab Results  Component Value Date   TSH 3.77 12/23/2018   Lab Results  Component Value Date   WBC 8.2 07/17/2017   HGB 12.0 07/17/2017   HCT 38.1 07/17/2017   MCV 87.0 07/17/2017   PLT 185 07/17/2017   Lab Results  Component Value Date   NA 139 04/13/2019   K 3.9 04/13/2019   CO2 24 04/13/2019   GLUCOSE 120 (H) 04/13/2019   BUN 13 04/13/2019   CREATININE 0.83 04/13/2019   BILITOT 0.4 12/27/2018   ALKPHOS 83 12/27/2018   AST 13 12/27/2018   ALT 13 12/27/2018   PROT 7.1 12/27/2018   ALBUMIN 3.8 12/27/2018   CALCIUM 9.7 04/13/2019   ANIONGAP 8 07/10/2017   Lab Results  Component Value Date   CHOL 185 12/27/2018   Lab Results  Component Value Date   HDL 47 12/27/2018   Lab Results  Component Value Date   LDLCALC 121 (H) 12/27/2018   Lab Results  Component Value Date   TRIG 86 12/27/2018   Lab Results  Component Value Date   CHOLHDL 3.9 12/27/2018   Lab Results  Component Value Date   HGBA1C 6.6 (A) 01/25/2019   Assessment & Plan:   1. Hypertension, unspecified type The current medical regimen is effective; blood pressure is stable at 140/88 today; continue present plan and medications as prescribed. She  will continue to decrease high sodium intake, excessive alcohol intake, increase potassium intake, smoking cessation, and increase physical activity of at least 30 minutes of cardio activity daily. She will continue to follow Heart Healthy or DASH diet. - POCT urinalysis dipstick  2. Class 3 severe obesity due to excess calories with serious comorbidity and body mass index (BMI) of 50.0 to 59.9 in adult [facility]) Body mass index is 52.92 kg/m.  Goal BMI  is <30. Encouraged efforts to reduce weight include engaging in physical activity as tolerated with goal of 150 minutes per week. Improve dietary choices and eat a meal regimen consistent with a Mediterranean or DASH diet. Reduce simple carbohydrates. Do not skip meals and eat healthy snacks throughout the day to avoid over-eating at dinner. Set a goal weight loss that is achievable for you.  3. Hypothyroidism, postablative Continue Synthroid as prescribed.   4. Follow up She will follow up in 3 months.   Problem List Items Addressed This Visit      Cardiovascular and Mediastinum   HTN (hypertension) - Primary   Relevant Orders   POCT urinalysis dipstick (Completed)     Endocrine   Hypothyroidism, postablative (Chronic)     Other   Obesity    Other Visit Diagnoses    Follow up          No orders of the defined types were placed in this encounter.   Follow-up: No follow-ups on file.    Azzie Glatter, FNP

## 2019-06-01 ENCOUNTER — Other Ambulatory Visit: Payer: Self-pay | Admitting: Physician Assistant

## 2019-06-14 ENCOUNTER — Ambulatory Visit: Payer: BLUE CROSS/BLUE SHIELD | Admitting: Cardiovascular Disease

## 2019-08-01 ENCOUNTER — Ambulatory Visit: Payer: BLUE CROSS/BLUE SHIELD | Admitting: Family Medicine

## 2019-08-14 ENCOUNTER — Other Ambulatory Visit: Payer: Self-pay

## 2019-08-14 ENCOUNTER — Inpatient Hospital Stay (HOSPITAL_COMMUNITY)
Admission: EM | Admit: 2019-08-14 | Discharge: 2019-09-20 | DRG: 208 | Disposition: E | Payer: BLUE CROSS/BLUE SHIELD | Attending: Family Medicine | Admitting: Family Medicine

## 2019-08-14 ENCOUNTER — Emergency Department (HOSPITAL_COMMUNITY): Payer: BLUE CROSS/BLUE SHIELD

## 2019-08-14 ENCOUNTER — Encounter (HOSPITAL_COMMUNITY): Payer: Self-pay

## 2019-08-14 DIAGNOSIS — I5043 Acute on chronic combined systolic (congestive) and diastolic (congestive) heart failure: Secondary | ICD-10-CM | POA: Diagnosis present

## 2019-08-14 DIAGNOSIS — R6521 Severe sepsis with septic shock: Secondary | ICD-10-CM | POA: Diagnosis not present

## 2019-08-14 DIAGNOSIS — U071 COVID-19: Principal | ICD-10-CM | POA: Diagnosis present

## 2019-08-14 DIAGNOSIS — E871 Hypo-osmolality and hyponatremia: Secondary | ICD-10-CM | POA: Diagnosis present

## 2019-08-14 DIAGNOSIS — Z7982 Long term (current) use of aspirin: Secondary | ICD-10-CM | POA: Diagnosis not present

## 2019-08-14 DIAGNOSIS — D72810 Lymphocytopenia: Secondary | ICD-10-CM | POA: Diagnosis present

## 2019-08-14 DIAGNOSIS — Z79890 Hormone replacement therapy (postmenopausal): Secondary | ICD-10-CM | POA: Diagnosis not present

## 2019-08-14 DIAGNOSIS — N179 Acute kidney failure, unspecified: Secondary | ICD-10-CM | POA: Diagnosis not present

## 2019-08-14 DIAGNOSIS — E875 Hyperkalemia: Secondary | ICD-10-CM | POA: Diagnosis not present

## 2019-08-14 DIAGNOSIS — D696 Thrombocytopenia, unspecified: Secondary | ICD-10-CM | POA: Diagnosis not present

## 2019-08-14 DIAGNOSIS — I509 Heart failure, unspecified: Secondary | ICD-10-CM

## 2019-08-14 DIAGNOSIS — I2510 Atherosclerotic heart disease of native coronary artery without angina pectoris: Secondary | ICD-10-CM | POA: Diagnosis present

## 2019-08-14 DIAGNOSIS — Z6841 Body Mass Index (BMI) 40.0 and over, adult: Secondary | ICD-10-CM

## 2019-08-14 DIAGNOSIS — Z8249 Family history of ischemic heart disease and other diseases of the circulatory system: Secondary | ICD-10-CM | POA: Diagnosis not present

## 2019-08-14 DIAGNOSIS — J9601 Acute respiratory failure with hypoxia: Secondary | ICD-10-CM | POA: Diagnosis present

## 2019-08-14 DIAGNOSIS — Z9111 Patient's noncompliance with dietary regimen: Secondary | ICD-10-CM | POA: Diagnosis not present

## 2019-08-14 DIAGNOSIS — E1165 Type 2 diabetes mellitus with hyperglycemia: Secondary | ICD-10-CM | POA: Diagnosis present

## 2019-08-14 DIAGNOSIS — R0602 Shortness of breath: Secondary | ICD-10-CM | POA: Diagnosis not present

## 2019-08-14 DIAGNOSIS — R570 Cardiogenic shock: Secondary | ICD-10-CM | POA: Diagnosis not present

## 2019-08-14 DIAGNOSIS — Z79899 Other long term (current) drug therapy: Secondary | ICD-10-CM

## 2019-08-14 DIAGNOSIS — A4189 Other specified sepsis: Secondary | ICD-10-CM | POA: Diagnosis not present

## 2019-08-14 DIAGNOSIS — D72829 Elevated white blood cell count, unspecified: Secondary | ICD-10-CM

## 2019-08-14 DIAGNOSIS — E785 Hyperlipidemia, unspecified: Secondary | ICD-10-CM | POA: Diagnosis present

## 2019-08-14 DIAGNOSIS — R001 Bradycardia, unspecified: Secondary | ICD-10-CM | POA: Diagnosis not present

## 2019-08-14 DIAGNOSIS — E869 Volume depletion, unspecified: Secondary | ICD-10-CM | POA: Diagnosis present

## 2019-08-14 DIAGNOSIS — I4901 Ventricular fibrillation: Secondary | ICD-10-CM | POA: Diagnosis not present

## 2019-08-14 DIAGNOSIS — J80 Acute respiratory distress syndrome: Secondary | ICD-10-CM

## 2019-08-14 DIAGNOSIS — E872 Acidosis: Secondary | ICD-10-CM | POA: Diagnosis not present

## 2019-08-14 DIAGNOSIS — E662 Morbid (severe) obesity with alveolar hypoventilation: Secondary | ICD-10-CM | POA: Diagnosis present

## 2019-08-14 DIAGNOSIS — I4891 Unspecified atrial fibrillation: Secondary | ICD-10-CM | POA: Diagnosis not present

## 2019-08-14 DIAGNOSIS — E876 Hypokalemia: Secondary | ICD-10-CM | POA: Diagnosis present

## 2019-08-14 DIAGNOSIS — Z9289 Personal history of other medical treatment: Secondary | ICD-10-CM

## 2019-08-14 DIAGNOSIS — J1289 Other viral pneumonia: Secondary | ICD-10-CM | POA: Diagnosis present

## 2019-08-14 DIAGNOSIS — Z0189 Encounter for other specified special examinations: Secondary | ICD-10-CM | POA: Diagnosis not present

## 2019-08-14 DIAGNOSIS — Z01818 Encounter for other preprocedural examination: Secondary | ICD-10-CM

## 2019-08-14 DIAGNOSIS — R0902 Hypoxemia: Secondary | ICD-10-CM | POA: Insufficient documentation

## 2019-08-14 DIAGNOSIS — I480 Paroxysmal atrial fibrillation: Secondary | ICD-10-CM | POA: Diagnosis not present

## 2019-08-14 DIAGNOSIS — G9340 Encephalopathy, unspecified: Secondary | ICD-10-CM | POA: Diagnosis not present

## 2019-08-14 DIAGNOSIS — I5021 Acute systolic (congestive) heart failure: Secondary | ICD-10-CM | POA: Diagnosis not present

## 2019-08-14 DIAGNOSIS — E039 Hypothyroidism, unspecified: Secondary | ICD-10-CM | POA: Diagnosis present

## 2019-08-14 DIAGNOSIS — I5041 Acute combined systolic (congestive) and diastolic (congestive) heart failure: Secondary | ICD-10-CM | POA: Diagnosis not present

## 2019-08-14 DIAGNOSIS — I428 Other cardiomyopathies: Secondary | ICD-10-CM | POA: Diagnosis present

## 2019-08-14 DIAGNOSIS — R9431 Abnormal electrocardiogram [ECG] [EKG]: Secondary | ICD-10-CM | POA: Diagnosis not present

## 2019-08-14 DIAGNOSIS — Z9851 Tubal ligation status: Secondary | ICD-10-CM

## 2019-08-14 DIAGNOSIS — R579 Shock, unspecified: Secondary | ICD-10-CM | POA: Diagnosis not present

## 2019-08-14 DIAGNOSIS — J069 Acute upper respiratory infection, unspecified: Secondary | ICD-10-CM | POA: Diagnosis not present

## 2019-08-14 DIAGNOSIS — I110 Hypertensive heart disease with heart failure: Secondary | ICD-10-CM | POA: Diagnosis present

## 2019-08-14 DIAGNOSIS — J810 Acute pulmonary edema: Secondary | ICD-10-CM | POA: Diagnosis not present

## 2019-08-14 LAB — CBC WITH DIFFERENTIAL/PLATELET
Abs Immature Granulocytes: 0.06 10*3/uL (ref 0.00–0.07)
Basophils Absolute: 0 10*3/uL (ref 0.0–0.1)
Basophils Relative: 0 %
Eosinophils Absolute: 0 10*3/uL (ref 0.0–0.5)
Eosinophils Relative: 0 %
HCT: 41 % (ref 36.0–46.0)
Hemoglobin: 12.9 g/dL (ref 12.0–15.0)
Immature Granulocytes: 1 %
Lymphocytes Relative: 7 %
Lymphs Abs: 0.7 10*3/uL (ref 0.7–4.0)
MCH: 28.5 pg (ref 26.0–34.0)
MCHC: 31.5 g/dL (ref 30.0–36.0)
MCV: 90.5 fL (ref 80.0–100.0)
Monocytes Absolute: 1.2 10*3/uL — ABNORMAL HIGH (ref 0.1–1.0)
Monocytes Relative: 11 %
Neutro Abs: 8.8 10*3/uL — ABNORMAL HIGH (ref 1.7–7.7)
Neutrophils Relative %: 81 %
Platelets: 144 10*3/uL — ABNORMAL LOW (ref 150–400)
RBC: 4.53 MIL/uL (ref 3.87–5.11)
RDW: 14.2 % (ref 11.5–15.5)
WBC: 10.8 10*3/uL — ABNORMAL HIGH (ref 4.0–10.5)
nRBC: 0 % (ref 0.0–0.2)

## 2019-08-14 LAB — BLOOD GAS, VENOUS
Acid-Base Excess: 0.5 mmol/L (ref 0.0–2.0)
Bicarbonate: 24 mmol/L (ref 20.0–28.0)
O2 Saturation: 86.6 %
Patient temperature: 98.6
pCO2, Ven: 37.6 mmHg — ABNORMAL LOW (ref 44.0–60.0)
pH, Ven: 7.421 (ref 7.250–7.430)
pO2, Ven: 51.8 mmHg — ABNORMAL HIGH (ref 32.0–45.0)

## 2019-08-14 LAB — COMPREHENSIVE METABOLIC PANEL
ALT: 15 U/L (ref 0–44)
AST: 13 U/L — ABNORMAL LOW (ref 15–41)
Albumin: 3.4 g/dL — ABNORMAL LOW (ref 3.5–5.0)
Alkaline Phosphatase: 64 U/L (ref 38–126)
Anion gap: 10 (ref 5–15)
BUN: 15 mg/dL (ref 6–20)
CO2: 21 mmol/L — ABNORMAL LOW (ref 22–32)
Calcium: 8.5 mg/dL — ABNORMAL LOW (ref 8.9–10.3)
Chloride: 104 mmol/L (ref 98–111)
Creatinine, Ser: 0.74 mg/dL (ref 0.44–1.00)
GFR calc Af Amer: >60 mL/min (ref >60)
GFR calc non Af Amer: >60 mL/min (ref >60)
Glucose, Bld: 114 mg/dL — ABNORMAL HIGH (ref 70–99)
Potassium: 3.4 mmol/L — ABNORMAL LOW (ref 3.5–5.1)
Sodium: 135 mmol/L (ref 135–145)
Total Bilirubin: 1.4 mg/dL — ABNORMAL HIGH (ref 0.3–1.2)
Total Protein: 7.5 g/dL (ref 6.5–8.1)

## 2019-08-14 LAB — PROCALCITONIN: Procalcitonin: <0.1 ng/mL

## 2019-08-14 LAB — C-REACTIVE PROTEIN: CRP: 11.1 mg/dL — ABNORMAL HIGH (ref <1.0)

## 2019-08-14 LAB — LACTIC ACID, PLASMA
Lactic Acid, Venous: 0.8 mmol/L (ref 0.5–1.9)
Lactic Acid, Venous: 1.1 mmol/L (ref 0.5–1.9)

## 2019-08-14 LAB — D-DIMER, QUANTITATIVE: D-Dimer, Quant: 1 ug/mL-FEU — ABNORMAL HIGH (ref 0.00–0.50)

## 2019-08-14 LAB — TROPONIN I (HIGH SENSITIVITY)
Troponin I (High Sensitivity): 12 ng/L (ref <18)
Troponin I (High Sensitivity): 13 ng/L (ref <18)

## 2019-08-14 LAB — BRAIN NATRIURETIC PEPTIDE: B Natriuretic Peptide: 314 pg/mL — ABNORMAL HIGH (ref 0.0–100.0)

## 2019-08-14 LAB — TSH: TSH: 1.465 u[IU]/mL (ref 0.350–4.500)

## 2019-08-14 LAB — FERRITIN: Ferritin: 455 ng/mL — ABNORMAL HIGH (ref 11–307)

## 2019-08-14 LAB — LACTATE DEHYDROGENASE: LDH: 200 U/L — ABNORMAL HIGH (ref 98–192)

## 2019-08-14 MED ORDER — SPIRONOLACTONE 25 MG PO TABS
25.0000 mg | ORAL_TABLET | Freq: Every day | ORAL | Status: DC
  Administered 2019-08-15 – 2019-08-17 (×3): 25 mg via ORAL
  Filled 2019-08-14 (×5): qty 1

## 2019-08-14 MED ORDER — ENOXAPARIN SODIUM 40 MG/0.4ML ~~LOC~~ SOLN: 40.0000 mg | SUBCUTANEOUS | Status: DC

## 2019-08-14 MED ORDER — FUROSEMIDE 10 MG/ML IJ SOLN
60.0000 mg | Freq: Once | INTRAMUSCULAR | Status: AC
  Administered 2019-08-14: 60 mg via INTRAVENOUS
  Filled 2019-08-14: qty 8

## 2019-08-14 MED ORDER — POTASSIUM CHLORIDE CRYS ER 20 MEQ PO TBCR
40.0000 meq | EXTENDED_RELEASE_TABLET | Freq: Once | ORAL | Status: AC
  Administered 2019-08-14: 40 meq via ORAL
  Filled 2019-08-14: qty 2

## 2019-08-14 MED ORDER — ASPIRIN EC 81 MG PO TBEC
81.0000 mg | DELAYED_RELEASE_TABLET | Freq: Every day | ORAL | Status: DC
  Administered 2019-08-15 – 2019-08-18 (×4): 81 mg via ORAL
  Filled 2019-08-14 (×4): qty 1

## 2019-08-14 MED ORDER — ENOXAPARIN SODIUM 60 MG/0.6ML ~~LOC~~ SOLN
60.0000 mg | SUBCUTANEOUS | Status: DC
  Administered 2019-08-14 – 2019-08-15 (×2): 60 mg via SUBCUTANEOUS
  Filled 2019-08-14 (×2): qty 0.6

## 2019-08-14 MED ORDER — SACUBITRIL-VALSARTAN 97-103 MG PO TABS
1.0000 | ORAL_TABLET | Freq: Two times a day (BID) | ORAL | Status: DC
  Administered 2019-08-14 – 2019-08-17 (×7): 1 via ORAL
  Filled 2019-08-14 (×11): qty 1

## 2019-08-14 MED ORDER — CARVEDILOL 12.5 MG PO TABS
12.5000 mg | ORAL_TABLET | Freq: Two times a day (BID) | ORAL | Status: DC
  Administered 2019-08-14 – 2019-08-16 (×4): 12.5 mg via ORAL
  Filled 2019-08-14 (×7): qty 1

## 2019-08-14 MED ORDER — ACETAMINOPHEN 500 MG PO TABS
500.0000 mg | ORAL_TABLET | Freq: Four times a day (QID) | ORAL | Status: DC | PRN
  Administered 2019-08-14 – 2019-08-17 (×6): 500 mg via ORAL
  Filled 2019-08-14 (×5): qty 1

## 2019-08-14 MED ORDER — FUROSEMIDE 10 MG/ML IJ SOLN
40.0000 mg | Freq: Two times a day (BID) | INTRAMUSCULAR | Status: DC
  Administered 2019-08-15: 40 mg via INTRAVENOUS
  Filled 2019-08-14: qty 4

## 2019-08-14 MED ORDER — LEVOTHYROXINE SODIUM 125 MCG PO TABS
125.0000 ug | ORAL_TABLET | Freq: Every day | ORAL | Status: DC
  Administered 2019-08-15 – 2019-08-18 (×4): 125 ug via ORAL
  Filled 2019-08-14 (×8): qty 1

## 2019-08-14 MED ORDER — ALBUTEROL SULFATE (2.5 MG/3ML) 0.083% IN NEBU: 2.5000 mg | INHALATION_SOLUTION | Freq: Four times a day (QID) | RESPIRATORY_TRACT | Status: DC | PRN

## 2019-08-14 MED ORDER — ORAL CARE MOUTH RINSE
15.0000 mL | Freq: Two times a day (BID) | OROMUCOSAL | Status: DC
  Administered 2019-08-14 – 2019-08-23 (×16): 15 mL via OROMUCOSAL

## 2019-08-14 MED ORDER — ATORVASTATIN CALCIUM 40 MG PO TABS
80.0000 mg | ORAL_TABLET | Freq: Every day | ORAL | Status: DC
  Administered 2019-08-15 – 2019-08-18 (×4): 80 mg via ORAL
  Filled 2019-08-14: qty 1
  Filled 2019-08-14 (×5): qty 2

## 2019-08-14 NOTE — H&P (Addendum)
History and Physical    Brittney Ross GXQ:[id number] DOB: 1961-06-28 DOA: 08/13/2019  PCP: Azzie Glatter, FNP Patient coming from: Home  Chief Complaint: Shortness of breath and cough for 2 days  HPI: Brittney Ross is a 58 y.o. 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  She denies any fever or chills.  She denies any sick contacts.  She works in a group home.  She lives at home with her husband.  Nobody at home is sick and nobody in the group home is also sick per patient. She reports taking her medications as prescribed but she does admit to dietary noncompliance. She denies weight gain or chest pain or nausea vomiting  abdominal pain or urinary complaints.  She did say she had diarrhea yesterday which has been resolved.  She does not have oxygen at home. She denies loss of smell or taste denies travel or sick contacts.  ED Course: Lasix 60 mg x 1 given. Blood pressure 174/73 pulse is 80 respiration 24 saturation 98% on 3 L of oxygen.  Upon arrival she was tachypneic at 31 and hypoxic at 88% on room air.  Temperature 98.9. Chest x-ray reviewed by me pulmonary vascular congestion noted left costophrenic angle blunted Sodium 135 potassium 3.4 BUN 15 creatinine 0.74 ABG 7.4 2/37/50 1/98% White count 10.8 hemoglobin 12.9 platelet count 144 BNP 314 lactic acid 0.8 Total bilirubin 1.4 Albumin 3.4 AST 13 ALT 15   Review of Systems: As per HPI otherwise all other systems reviewed and are negative  Ambulatory Status: Ambulatory at baseline does all ADLs by herself.  Past Medical History:  Diagnosis Date  . Anemia   . Chronic combined systolic and diastolic heart failure (HCC) 07/05/2017   Non-ischemic CM // Echo 9/18:EF 20-25, severe diff HK, Gr 1 DD, mild MR,  mod LAE // R/L Heart Cath 9/18: pD1 40, mD1 30; o/w normal cors // Echo 5/19: EF 35-40, trivial AI, mildly dilated aortic root (38 mm), MAC, mild LAE   . Hyperlipidemia   . Hypertension    Pt states she "doesn't have HTN"  . Morbid obesity ([place])   . Thyroid disease hyperthyroidism    Past Surgical History:  Procedure Laterality Date  . CESAREAN SECTION  12/07/79  . CESAREAN SECTION  05/17/81  . CESAREAN SECTION  09/06/89  . RATH/BSO/cystoscopy    . RIGHT/LEFT HEART CATH AND CORONARY ANGIOGRAPHY N/A 07/09/2017   Procedure: RIGHT/LEFT HEART CATH AND CORONARY ANGIOGRAPHY;  Surgeon: Troy Sine, MD;  Location: [place] [facility];  Service: Cardiovascular;  Laterality: N/A;  . TUBAL LIGATION      Social History   Socioeconomic History  . Marital status: Married    Spouse name: Not on file  . Number of children: Not on file  . Years of education: Not on file  . Highest education level: Not on file  Occupational History  . Not on file  Social Needs  . Financial resource strain: Not on file  . Food insecurity    Worry: Not on file    Inability: Not on file  . Transportation needs    Medical: Not on file    Non-medical: Not on file  Tobacco Use  . Smoking status: Never Smoker  . Smokeless tobacco: Never Used  Substance and Sexual Activity  .  Alcohol use: No  . Drug use: No  . Sexual activity: Yes    Birth control/protection: Surgical  Lifestyle  . Physical activity    Days per week: Not on file    Minutes per session: Not on file  . Stress: Not on file  Relationships  . Social Herbalist on phone: Not on file    Gets together: Not on file    Attends religious service: Not on file    Active member of club or organization: Not on file    Attends meetings of clubs or organizations: Not on file    Relationship status: Not on file  . Intimate partner violence    Fear of current or ex partner: Not on file    Emotionally abused: Not on file    Physically  abused: Not on file    Forced sexual activity: Not on file  Other Topics Concern  . Not on file  Social History Narrative  . Not on file    No Known Allergies  Family History  Problem Relation Age of Onset  . Hypertension Father   . Heart disease Mother   . Hypertension Sister     Prior to Admission medications   Medication Sig Start Date End Date Taking? Authorizing Provider  albuterol (PROVENTIL HFA;VENTOLIN HFA) 108 (90 Base) MCG/ACT inhaler Inhale 2 puffs into the lungs every 6 (six) hours as needed for wheezing or shortness of breath. 07/28/18  Yes Azzie Glatter, FNP  aspirin EC 81 MG tablet Take 81 mg by mouth daily.   Yes [provider]  atorvastatin (LIPITOR) 80 MG tablet Take 1 tablet (80 mg total) by mouth daily. 04/29/19  Yes Azzie Glatter, FNP  carvedilol (COREG) 12.5 MG tablet Take 1 tablet (12.5 mg total) by mouth 2 (two) times daily. 12/27/18  Yes Nahser, Wonda Cheng, MD  furosemide (LASIX) 20 MG tablet Take 1 tablet (20 mg total) by mouth daily as needed for fluid or edema. 08/18/17  Yes Weaver, Scott T, PA-C  levothyroxine (SYNTHROID, LEVOTHROID) 125 MCG tablet Take 1 tablet (125 mcg total) by mouth daily. 12/10/18  Yes Philemon Kingdom, MD  sacubitril-valsartan (ENTRESTO) 97-103 MG Take 1 tablet by mouth 2 (two) times daily. 12/27/18  Yes Nahser, Wonda Cheng, MD  spironolactone (ALDACTONE) 25 MG tablet Take 1 tablet (25 mg total) by mouth daily. 03/28/19  Yes Nahser, Wonda Cheng, MD    Physical Exam: Vitals:   08/11/2019 1230 08/10/2019 1300 08/15/2019 1330 07/23/2019 1400  BP: (!) 176/80 (!) 183/71 (!) 171/76 (!) 174/73  Pulse: 79 78 80 80  Resp: (!) 26 (!) 25 (!) 31 (!) 24  Temp:      SpO2: 98% 100% 100% 98%  Weight:         . General:  Appears mild distress due to hypoxia  . eyes: PERRL, EOMI, normal lids, iris . ENT: grossly normal hearing, lips & tongue, mmm . Neck:  no LAD, masses or thyromegaly . Cardiovascular: RRR, no m/r/g. No LE edema.  Marland Kitchen  Respiratory: Diminished breath sounds bilaterally, no w/r/r. Normal respiratory effort. . Abdomen: soft, ntnd, NABS . Skin:  no rash or induration seen on limited exam . Musculoskeletal:  grossly normal tone BUE/BLE, good ROM, no bony abnormality 1+ pitting edema . Psychiatric:  grossly normal mood and affect, speech fluent and appropriate, AOx3 . Neurologic:  CN 2-12 grossly intact, moves all extremities in coordinated fashion, sensation intact  Labs on Admission: I have  personally reviewed following labs and imaging studies  CBC: Recent Labs  Lab 08/03/2019 1140  WBC 10.8*  NEUTROABS 8.8*  HGB 12.9  HCT 41.0  MCV 90.5  PLT 161*   Basic Metabolic Panel: Recent Labs  Lab 08/15/2019 1140  NA 135  K 3.4*  CL 104  CO2 21*  GLUCOSE 114*  BUN 15  CREATININE 0.74  CALCIUM 8.5*   GFR: Estimated Creatinine Clearance: 112.4 mL/min (by C-G formula based on SCr of 0.74 mg/dL). Liver Function Tests: Recent Labs  Lab 08/07/2019 1140  AST 13*  ALT 15  ALKPHOS 64  BILITOT 1.4*  PROT 7.5  ALBUMIN 3.4*   No results for input(s): LIPASE, AMYLASE in the last 168 hours. No results for input(s): AMMONIA in the last 168 hours. Coagulation Profile: No results for input(s): INR, PROTIME in the last 168 hours. Cardiac Enzymes: No results for input(s): CKTOTAL, CKMB, CKMBINDEX, TROPONINI in the last 168 hours. BNP (last 3 results) No results for input(s): PROBNP in the last 8760 hours. HbA1C: No results for input(s): HGBA1C in the last 72 hours. CBG: No results for input(s): GLUCAP in the last 168 hours. Lipid Profile: No results for input(s): CHOL, HDL, LDLCALC, TRIG, CHOLHDL, LDLDIRECT in the last 72 hours. Thyroid Function Tests: No results for input(s): TSH, T4TOTAL, FREET4, T3FREE, THYROIDAB in the last 72 hours. Anemia Panel: No results for input(s): VITAMINB12, FOLATE, FERRITIN, TIBC, IRON, RETICCTPCT in the last 72 hours. Urine analysis:    Component Value Date/Time    COLORURINE YELLOW 10/09/2016 0913   APPEARANCEUR HAZY (A) 10/09/2016 0913   LABSPEC >=1.030 09/14/2017 0946   PHURINE 5.5 09/14/2017 0946   GLUCOSEU NEGATIVE 09/14/2017 0946   HGBUR NEGATIVE 09/14/2017 0946   HGBUR negative 06/13/2010 0000   BILIRUBINUR small (A) 04/29/2019 1509   KETONESUR negative 04/29/2019 1509   KETONESUR NEGATIVE 09/14/2017 0946   PROTEINUR trace (A) 04/29/2019 1509   PROTEINUR NEGATIVE 09/14/2017 0946   UROBILINOGEN 2.0 (A) 04/29/2019 1509   UROBILINOGEN 0.2 09/14/2017 0946   NITRITE Negative 04/29/2019 1509   NITRITE NEGATIVE 09/14/2017 0946   LEUKOCYTESUR Negative 04/29/2019 1509    Creatinine Clearance: Estimated Creatinine Clearance: 112.4 mL/min (by C-G formula based on SCr of 0.74 mg/dL).  Sepsis Labs: _0 (procalcitonin:4,lacticidven:4) )No results found for this or any previous visit (from the past 240 hour(s)).   Radiological Exams on Admission: Dg Chest Port 1 View  Result Date: 07/31/2019 CLINICAL DATA:  Shortness of breath for 2 days EXAM: PORTABLE CHEST 1 VIEW COMPARISON:  07/05/2017 FINDINGS: Bilateral interstitial thickening. No pleural effusion or pneumothorax. No focal consolidation. Stable cardiomegaly. No acute osseous abnormality. IMPRESSION: Cardiomegaly with mild pulmonary vascular congestion. Electronically Signed   By: Kathreen Devoid   On: 08/15/2019 11:48    EKG: Independently reviewed.  No acute ST-T wave changes.  Assessment/Plan Active Problems:   CHF (congestive heart failure) (HCC)    #1 acute hypoxic respiratory failure likely secondary to CHF exacerbation.  Likely secondary to dietary noncompliance.  Patient has a history of combined diastolic and systolic heart failure.  Her last echocardiogram was in May 2019 Left ventricle: The cavity size was severely dilated. Systolic   function was moderately reduced. The estimated ejection fraction   was in the range of 35% to 40%. Images were inadequate for LV   wall motion  assessment even with definity contrast. The study is   not technically sufficient to allow evaluation of LV diastolic   function. - Aortic valve: There was  trivial regurgitation. - Aorta: Aortic root dimension: 38 mm (ED). - Aortic root: The aortic root was mildly dilated. - Mitral valve: Calcified annulus. - Left atrium: The atrium was mildly dilated.  She takes Lasix 20 mg daily at home. Increase Lasix to 40 mg twice a day. Continue Aldactone and Entresto. Will check echocardiogram TSH I's and O's Daily weights Check troponin  Covid is pending Check procalcitonin CRP D-dimer LDH and ferritin  #2 hypothyroidism continue Synthroid check TSH  #3 hyperlipidemia continue Lipitor check lipid panel  #4 hypertension continue Coreg in addition to Lasix Entresto and Aldactone.  #5 history of mild CAD continue aspirin  #8 elevated hemoglobin A1c 6.6 in April 2020.  Recheck hemoglobin A1c.  She does not carry a history of type 2 diabetes yet.  #9 morbid obesity  #10 mild hypokalemia replete and recheck  #11 mild thrombocytopenia monitor closely on aspirin and Lovenox  #12 mild leukocytosis probably reactive follow   Severity of Illness: The appropriate patient status for this patient is INPATIENT. Inpatient status is judged to be reasonable and necessary in order to provide the required intensity of service to ensure the patient's safety. The patient's presenting symptoms, physical exam findings, and initial radiographic and laboratory data in the context of their chronic comorbidities is felt to place them at high risk for further clinical deterioration. Furthermore, it is not anticipated that the patient will be medically stable for discharge from the hospital within 2 midnights of admission. The following factors support the patient status of inpatient.   " The patient's presenting symptoms include redness of breath and cough " The worrisome physical exam findings include  decreased breath sounds at the bases and lower extremity edema " The initial radiographic and laboratory data are worrisome because of pulmonary vascular congestion hypokalemia elevated BNP  " The chronic co-morbidities include morbid obesity hypertension congestive heart failure   * I certify that at the point of admission it is my clinical judgment that the patient will require inpatient hospital care spanning beyond 2 midnights from the point of admission due to high intensity of service, high risk for further deterioration and high frequency of surveillance required.*    Estimated body mass index is 52.83 kg/m as calculated from the following:   Height as of 04/29/19: _0  (1.651 m).   Weight as of this encounter: 144 kg.   DVT prophylaxis Lovenox Code Status: Full code Family Communication none Disposition Plan: Pending clinical improvement Consults called: None  admission status: Inpatient   Georgette Shell MD [facility]  If 7PM-7AM, please contact night-coverage [email or web address] Password [email or web address]  08/10/2019, 2:17 PM

## 2019-08-14 NOTE — ED Notes (Signed)
Hospitalist at patient bedside

## 2019-08-14 NOTE — ED Provider Notes (Signed)
[facility] DEPT Provider Note   CSN: [id number] Arrival date & time: 07/25/2019  1033     History   Chief Complaint Chief Complaint  Patient presents with   Shortness of Breath    HPI Brittney Ross is a 58 y.o. female pertinent pmh of chronic combined CHF EF 30-35%, HTN, HLD, obesity, anemia here for evaluation of shortness of breath that was sudden onset last night. SOB is exertional. SpO2 88% on RA upon arrival to ER. She does not wear oxygen at home.  Reports associated dry cough that began Friday. She took theraflu yesterday without significant relief.  Has been compliant with lasix and spironolactone but doesn't know her doses.  In regards to fluid and food/salt intake states she "is trying" to be adherent with restrictions.  No other associated symptoms. No associated fever, congestion, sore throat, nausea, vomiting, diarrhea, abdominal pain, loss of taste/smell.  No sick contacts, travel. No CP, light headedness, syncope, orthopnea, PND, lower extremity edema, calf tenderness. No H/o DVT/PE, hormone therapy, recent surgery, immobilization, active cancer treatment. No h/o asthma, COPD, tobacco use. No lung disease history.  States once she had low oxygen and admitted when she found out she had CHF.     HPI  Past Medical History:  Diagnosis Date   Anemia    Chronic combined systolic and diastolic heart failure ([place]) 07/05/2017   Non-ischemic CM // Echo 9/18:EF 20-25, severe diff HK, Gr 1 DD, mild MR, mod LAE // R/L Heart Cath 9/18: pD1 40, mD1 30; o/w normal cors // Echo 5/19: EF 35-40, trivial AI, mildly dilated aortic root (38 mm), MAC, mild LAE    Hyperlipidemia    Hypertension    Pt states she "doesn't have HTN"   Morbid obesity ([place])    Thyroid disease hyperthyroidism    Patient Active Problem List   Diagnosis Date Noted   Coronary artery disease involving native coronary artery of native heart without angina pectoris 12/30/2017    HTN (hypertension) 09/14/2017   Dilated cardiomyopathy ([place])    Hypokalemia 07/05/2017   Chronic combined systolic and diastolic heart failure ([place]) 07/05/2017   Acute pulmonary edema ([place])    Chronic female pelvic pain 08/14/2011   H/O Graves' disease 03/20/2010   Hypothyroidism, 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   Obesity 03/20/2008   Hypertensive heart disease with heart failure ([place]) 03/20/2008   HYPERCHOLESTEROLEMIA 02/22/2008   MIGRAINE HEADACHE 02/21/2008   KNEE PAIN, RIGHT 02/21/2008   FIBROIDS, UTERUS 02/06/2008   ANEMIA 02/06/2008    Past Surgical History:  Procedure Laterality Date   CESAREAN SECTION  12/07/79   CESAREAN SECTION  05/17/81   CESAREAN SECTION  09/06/89   RATH/BSO/cystoscopy     RIGHT/LEFT HEART CATH AND CORONARY ANGIOGRAPHY N/A 07/09/2017   Procedure: RIGHT/LEFT HEART CATH AND CORONARY ANGIOGRAPHY;  Surgeon: Troy Sine, MD;  Location: [facility];  Service: Cardiovascular;  Laterality: N/A;   TUBAL LIGATION       OB History    Gravida  3   Para  3   Term  3   Preterm  0   AB  0   Living  3     SAB  0   TAB  0   Ectopic  0   Multiple  0   Live Births  3            Home Medications    Prior to Admission medications   Medication Sig Start Date End Date Taking?  Authorizing Provider  albuterol (PROVENTIL HFA;VENTOLIN HFA) 108 (90 Base) MCG/ACT inhaler Inhale 2 puffs into the lungs every 6 (six) hours as needed for wheezing or shortness of breath. 07/28/18  Yes Azzie Glatter, FNP  aspirin EC 81 MG tablet Take 81 mg by mouth daily.   Yes [provider]  atorvastatin (LIPITOR) 80 MG tablet Take 1 tablet (80 mg total) by mouth daily. 04/29/19  Yes Azzie Glatter, FNP  carvedilol (COREG) 12.5 MG tablet Take 1 tablet (12.5 mg total) by mouth 2 (two) times daily. 12/27/18  Yes Nahser, Wonda Cheng, MD  furosemide (LASIX) 20 MG tablet Take 1 tablet (20 mg total) by mouth daily as needed for fluid or edema.  08/18/17  Yes Weaver, Scott T, PA-C  levothyroxine (SYNTHROID, LEVOTHROID) 125 MCG tablet Take 1 tablet (125 mcg total) by mouth daily. 12/10/18  Yes Philemon Kingdom, MD  sacubitril-valsartan (ENTRESTO) 97-103 MG Take 1 tablet by mouth 2 (two) times daily. 12/27/18  Yes Nahser, Wonda Cheng, MD  spironolactone (ALDACTONE) 25 MG tablet Take 1 tablet (25 mg total) by mouth daily. 03/28/19  Yes Nahser, Wonda Cheng, MD    Family History Family History  Problem Relation Age of Onset   Hypertension Father    Heart disease Mother    Hypertension Sister     Social History Social History   Tobacco Use   Smoking status: Never Smoker   Smokeless tobacco: Never Used  Substance Use Topics   Alcohol use: No   Drug use: No     Allergies   Patient has no known allergies.   Review of Systems Review of Systems  Respiratory: Positive for cough and shortness of breath.   All other systems reviewed and are negative.    Physical Exam Updated Vital Signs BP (!) 183/71    Pulse 78    Temp 98.9 F (37.2 C)    Resp (!) 25    Wt (!) 144 kg    LMP 10/28/2011    SpO2 100%    BMI 52.83 kg/m   Physical Exam Vitals signs and nursing note reviewed.  Constitutional:      General: She is not in acute distress.    Appearance: She is well-developed.     Comments: NAD. No distress. Awake.   HENT:     Head: Normocephalic and atraumatic.     Right Ear: External ear normal.     Left Ear: External ear normal.     Nose: Nose normal.  Eyes:     General: No scleral icterus.    Conjunctiva/sclera: Conjunctivae normal.  Neck:     Musculoskeletal: Normal range of motion and neck supple.  Cardiovascular:     Rate and Rhythm: Normal rate and regular rhythm.     Heart sounds: Normal heart sounds.     Comments: 1+ symmetric pitting edema to distal pretibial and ankle areas. No calf tenderness. 1+ radial and DP pulses bilaterally.  Distant heart sounds at apex Pulmonary:     Effort: Respiratory distress  present.     Breath sounds: Normal breath sounds. No wheezing.     Comments: Mild respiratory distress SPO2 88-87% on RA. Alert, awake. SpO2 >94% on 2 L [place]. Speaking in full sentences now.  Diminished air sounds in left mid/lower lobes. No crackles or wheezing.  Musculoskeletal: Normal range of motion.        General: No deformity.  Skin:    General: Skin is warm and dry.  Capillary Refill: Capillary refill takes less than 2 seconds.  Neurological:     Mental Status: She is alert and oriented to person, place, and time.  Psychiatric:        Behavior: Behavior normal.        Thought Content: Thought content normal.        Judgment: Judgment normal.      ED Treatments / Results  Labs (all labs ordered are listed, but only abnormal results are displayed) Labs Reviewed  CBC WITH DIFFERENTIAL/PLATELET - Abnormal; Notable for the following components:      Result Value   WBC 10.8 (*)    Platelets 144 (*)    Neutro Abs 8.8 (*)    Monocytes Absolute 1.2 (*)    All other components within normal limits  COMPREHENSIVE METABOLIC PANEL - Abnormal; Notable for the following components:   Potassium 3.4 (*)    CO2 21 (*)    Glucose, Bld 114 (*)    Calcium 8.5 (*)    Albumin 3.4 (*)    AST 13 (*)    Total Bilirubin 1.4 (*)    All other components within normal limits  BRAIN NATRIURETIC PEPTIDE - Abnormal; Notable for the following components:   B Natriuretic Peptide 314.0 (*)    All other components within normal limits  BLOOD GAS, VENOUS - Abnormal; Notable for the following components:   pCO2, Ven 37.6 (*)    pO2, Ven 51.8 (*)    All other components within normal limits  SARS CORONAVIRUS 2 (TAT 6-24 HRS)  LACTIC ACID, PLASMA  LACTIC ACID, PLASMA    EKG EKG Interpretation  Date/Time:  Sunday August 14 2019 10:46:56 EDT Ventricular Rate:  81 PR Interval:    QRS Duration: 97 QT Interval:  391 QTC Calculation: 454 R Axis:   36 Text Interpretation:  Sinus rhythm Probable  left ventricular hypertrophy Borderline T abnormalities, anterior leads Baseline wander in lead(s) II III aVF since last tracing no significant change Confirmed by Daleen Bo [phone]) on 07/25/2019 11:01:22 AM   Radiology Dg Chest Port 1 View  Result Date: 07/22/2019 CLINICAL DATA:  Shortness of breath for 2 days EXAM: PORTABLE CHEST 1 VIEW COMPARISON:  07/05/2017 FINDINGS: Bilateral interstitial thickening. No pleural effusion or pneumothorax. No focal consolidation. Stable cardiomegaly. No acute osseous abnormality. IMPRESSION: Cardiomegaly with mild pulmonary vascular congestion. Electronically Signed   By: Kathreen Devoid   On: 07/21/2019 11:48    Procedures .Critical Care Performed by: Kinnie Feil, PA-C Authorized by: Kinnie Feil, PA-C   Critical care provider statement:    Critical care time (minutes):  45   Critical care was necessary to treat or prevent imminent or life-threatening deterioration of the following conditions:  Respiratory failure   Critical care was time spent personally by me on the following activities:  Discussions with consultants, evaluation of patient's response to treatment, examination of patient, ordering and performing treatments and interventions, ordering and review of laboratory studies, ordering and review of radiographic studies, pulse oximetry, re-evaluation of patient's condition, obtaining history from patient or surrogate, review of old charts and development of treatment plan with patient or surrogate   I assumed direction of critical care for this patient from another provider in my specialty: no     (including critical care time)  Medications Ordered in ED Medications  furosemide (LASIX) injection 60 mg (has no administration in time range)     Initial Impression / Assessment and Plan / ED Course  I have reviewed the triage vital signs and the nursing notes.  Pertinent labs & imaging results that were available during my care  of the patient were reviewed by me and considered in my medical decision making (see chart for details).  Clinical Course as of Aug 14 1327  Sun Aug 14, 2019  1123 SpO2(!): 88 % [CG]  1124 Resp(!): 34 [CG]  1124 BP(!): 177/89 [CG]  1124 Temp: 98.9 F (37.2 C) [CG]  1124 Pulse Rate: 80 [CG]  1252 SpO2(!): 88 % [CG]  1252 Resp(!): 26 [CG]  1253 WBC(!): 10.8 [CG]  1253 Potassium(!): 3.4 [CG]  1253 Glucose(!): 114 [CG]  1253 pH, Ven: 7.421 [CG]  1253 B Natriuretic Peptide(!): 314.0 [CG]  1253 IMPRESSION: Cardiomegaly with mild pulmonary vascular congestion.  DG Chest Port 1 View [CG]    Clinical Course User Index [CG] Kinnie Feil, PA-C    EMR reviewed to assist with MDM.  She is hypoxic and tachypnic on RA, stable now on 2 L [place].  Afebrile without tachycardia.  1+ pitting edema noted.   Highest on ddx is decompensated heart failure given pitting edema, hypoxia, and diminished air sounds.  She has no exertional or pleuritic CP and ACS or PE very unlikely.  She has no risk factors for PE.  Mild CAD on last heart cath 2018 and ACS is unlikely given no exertional CP.   ER work up reviewed by me as above.   BNP 314, CXR with CM and vascular congestion. Mild hyperglycemia without DKA?HHS.  WBC 10.8 without fever or lactic acidosis. VBG with normal pH.   Re-evaluated patient and no clinica decline. COVID test pending other than dry cough and SOB she has no other symptoms to point to COVID and favoring decomp HF.   Discussed with Dr Zigmund Daniel who has accepted patient.   Final Clinical Impressions(s) / ED Diagnoses   Final diagnoses:  Hypoxia    ED Discharge Orders    None       Kinnie Feil, PA-C 07/29/2019 1328    Daleen Bo, MD 08/15/19 1349

## 2019-08-14 NOTE — ED Triage Notes (Addendum)
Pt c/o SHOB. Pt states that she has had difficulty the last 2 days. Pt states she has been hospitalized in the past for same. Pt endorses a significant amount of diarrhea yesterday. Pt states she took theraflu PTA.

## 2019-08-15 ENCOUNTER — Inpatient Hospital Stay (HOSPITAL_COMMUNITY): Payer: BLUE CROSS/BLUE SHIELD

## 2019-08-15 DIAGNOSIS — J1289 Other viral pneumonia: Secondary | ICD-10-CM

## 2019-08-15 DIAGNOSIS — J9601 Acute respiratory failure with hypoxia: Secondary | ICD-10-CM | POA: Diagnosis not present

## 2019-08-15 DIAGNOSIS — D696 Thrombocytopenia, unspecified: Secondary | ICD-10-CM

## 2019-08-15 DIAGNOSIS — U071 COVID-19: Principal | ICD-10-CM

## 2019-08-15 DIAGNOSIS — J069 Acute upper respiratory infection, unspecified: Secondary | ICD-10-CM

## 2019-08-15 DIAGNOSIS — R9431 Abnormal electrocardiogram [ECG] [EKG]: Secondary | ICD-10-CM

## 2019-08-15 DIAGNOSIS — D72829 Elevated white blood cell count, unspecified: Secondary | ICD-10-CM

## 2019-08-15 LAB — GLUCOSE, CAPILLARY: Glucose-Capillary: 192 mg/dL — ABNORMAL HIGH (ref 70–99)

## 2019-08-15 LAB — COMPREHENSIVE METABOLIC PANEL
ALT: 15 U/L (ref 0–44)
AST: 16 U/L (ref 15–41)
Albumin: 3.2 g/dL — ABNORMAL LOW (ref 3.5–5.0)
Alkaline Phosphatase: 56 U/L (ref 38–126)
Anion gap: 11 (ref 5–15)
BUN: 19 mg/dL (ref 6–20)
CO2: 22 mmol/L (ref 22–32)
Calcium: 8.3 mg/dL — ABNORMAL LOW (ref 8.9–10.3)
Chloride: 101 mmol/L (ref 98–111)
Creatinine, Ser: 1.06 mg/dL — ABNORMAL HIGH (ref 0.44–1.00)
GFR calc Af Amer: >60 mL/min (ref >60)
GFR calc non Af Amer: 58 mL/min — ABNORMAL LOW (ref >60)
Glucose, Bld: 116 mg/dL — ABNORMAL HIGH (ref 70–99)
Potassium: 3.7 mmol/L (ref 3.5–5.1)
Sodium: 134 mmol/L — ABNORMAL LOW (ref 135–145)
Total Bilirubin: 1.3 mg/dL — ABNORMAL HIGH (ref 0.3–1.2)
Total Protein: 7.8 g/dL (ref 6.5–8.1)

## 2019-08-15 LAB — LIPID PANEL
Cholesterol: 187 mg/dL (ref 0–200)
HDL: 55 mg/dL (ref >40)
LDL Cholesterol: 120 mg/dL — ABNORMAL HIGH (ref 0–99)
Total CHOL/HDL Ratio: 3.4 RATIO
Triglycerides: 60 mg/dL (ref <150)
VLDL: 12 mg/dL (ref 0–40)

## 2019-08-15 LAB — CBC
HCT: 43.1 % (ref 36.0–46.0)
Hemoglobin: 13.3 g/dL (ref 12.0–15.0)
MCH: 28.5 pg (ref 26.0–34.0)
MCHC: 30.9 g/dL (ref 30.0–36.0)
MCV: 92.5 fL (ref 80.0–100.0)
Platelets: 149 10*3/uL — ABNORMAL LOW (ref 150–400)
RBC: 4.66 MIL/uL (ref 3.87–5.11)
RDW: 14.5 % (ref 11.5–15.5)
WBC: 9.2 10*3/uL (ref 4.0–10.5)
nRBC: 0 % (ref 0.0–0.2)

## 2019-08-15 LAB — ECHOCARDIOGRAM COMPLETE
Height: 65 in
Weight: 4998.27 oz

## 2019-08-15 LAB — SARS CORONAVIRUS 2 (TAT 6-24 HRS): SARS Coronavirus 2: POSITIVE — AB

## 2019-08-15 LAB — HEMOGLOBIN A1C
Hgb A1c MFr Bld: 7 % — ABNORMAL HIGH (ref 4.8–5.6)
Mean Plasma Glucose: 154.2 mg/dL

## 2019-08-15 LAB — HIV ANTIBODY (ROUTINE TESTING W REFLEX): HIV Screen 4th Generation wRfx: NONREACTIVE

## 2019-08-15 LAB — MRSA PCR SCREENING: MRSA by PCR: NEGATIVE

## 2019-08-15 MED ORDER — DEXAMETHASONE SODIUM PHOSPHATE 10 MG/ML IJ SOLN
6.0000 mg | INTRAMUSCULAR | Status: DC
  Administered 2019-08-16 – 2019-08-22 (×7): 6 mg via INTRAVENOUS
  Filled 2019-08-15 (×7): qty 1

## 2019-08-15 MED ORDER — SODIUM CHLORIDE 0.9 % IV SOLN
200.0000 mg | Freq: Once | INTRAVENOUS | Status: AC
  Administered 2019-08-15: 11:00:00 200 mg via INTRAVENOUS
  Filled 2019-08-15: qty 40

## 2019-08-15 MED ORDER — ALBUTEROL SULFATE HFA 108 (90 BASE) MCG/ACT IN AERS: 1.0000 | INHALATION_SPRAY | Freq: Four times a day (QID) | RESPIRATORY_TRACT | Status: DC | PRN

## 2019-08-15 MED ORDER — TOCILIZUMAB 400 MG/20ML IV SOLN
800.0000 mg | Freq: Once | INTRAVENOUS | Status: AC
  Administered 2019-08-15: 13:00:00 800 mg via INTRAVENOUS
  Filled 2019-08-15: qty 40

## 2019-08-15 MED ORDER — INSULIN ASPART 100 UNIT/ML ~~LOC~~ SOLN
0.0000 [IU] | Freq: Three times a day (TID) | SUBCUTANEOUS | Status: DC
  Administered 2019-08-15 – 2019-08-17 (×5): 2 [IU] via SUBCUTANEOUS
  Administered 2019-08-17: 1 [IU] via SUBCUTANEOUS
  Administered 2019-08-17: 2 [IU] via SUBCUTANEOUS
  Administered 2019-08-18 – 2019-08-19 (×3): 1 [IU] via SUBCUTANEOUS
  Administered 2019-08-19: 2 [IU] via SUBCUTANEOUS
  Administered 2019-08-19: 1 [IU] via SUBCUTANEOUS
  Administered 2019-08-20: 3 [IU] via SUBCUTANEOUS
  Administered 2019-08-20: 1 [IU] via SUBCUTANEOUS
  Administered 2019-08-21 (×2): 2 [IU] via SUBCUTANEOUS
  Administered 2019-08-22: 1 [IU] via SUBCUTANEOUS
  Administered 2019-08-22 – 2019-08-23 (×2): 2 [IU] via SUBCUTANEOUS
  Administered 2019-08-23: 3 [IU] via SUBCUTANEOUS
  Administered 2019-08-23: 2 [IU] via SUBCUTANEOUS

## 2019-08-15 MED ORDER — SODIUM CHLORIDE 0.9% FLUSH: 10.0000 mL | INTRAVENOUS | Status: DC | PRN

## 2019-08-15 MED ORDER — SODIUM CHLORIDE 0.9 % IV SOLN
100.0000 mg | INTRAVENOUS | Status: AC
  Administered 2019-08-16 – 2019-08-19 (×4): 100 mg via INTRAVENOUS
  Filled 2019-08-15 (×4): qty 20

## 2019-08-15 MED ORDER — CHLORHEXIDINE GLUCONATE CLOTH 2 % EX PADS
6.0000 | MEDICATED_PAD | Freq: Every day | CUTANEOUS | Status: DC
  Administered 2019-08-15 – 2019-08-23 (×9): 6 via TOPICAL

## 2019-08-15 MED ORDER — DEXAMETHASONE SODIUM PHOSPHATE 10 MG/ML IJ SOLN
10.0000 mg | Freq: Once | INTRAMUSCULAR | Status: AC
  Administered 2019-08-15: 10 mg via INTRAVENOUS
  Filled 2019-08-15 (×2): qty 1

## 2019-08-15 MED ORDER — FUROSEMIDE 10 MG/ML IJ SOLN
20.0000 mg | Freq: Once | INTRAMUSCULAR | Status: AC
  Administered 2019-08-15: 20 mg via INTRAVENOUS

## 2019-08-15 NOTE — Progress Notes (Signed)
MEWS red due to T 102, R 50. Oxygen was 88 % on 2 liters. O2 increased to 4 lt with sat 92-94%.  Provider updated.

## 2019-08-15 NOTE — Consult Note (Signed)
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  She did not have fevers or sick contacts he did have one episode of loose stools.  She lives at home with her husband. Admission chest x-ray showed cardiomegaly and bilateral interstitial infiltrates.  Oxygen saturation was 88% on room air which improved on 3 L.  She was afebrile.  Overnight oxygen requirements have increased to 8 L oxygen this morning and she was tachypneic hence transferred to the ICU and PCCM is consulted.  Admission labs significant for BNP of 3149 lymphopenia She was given Lasix and diuresed 200 cc  SIGNIFICANT PAST MEDICAL HISTORY   Chronic systolic heart failure EF 30 to 35% Hypertension, hypothyroidism, hyperlipidemia, obesity  SIGNIFICANT EVENTS:  10/26 transferred to ICU STUDIES:   Echo 02/2018 EF 35 to 40% CULTURES:  SARS COV-2 10/25 >> POS  ANTIBIOTICS:    LINES/TUBES:    CONSULTANTS:   SUBJECTIVE:  Breathing is improved after being on high flow nasal cannula  CONSTITUTIONAL: BP (!) 105/56 (BP Location: Left Arm)   Pulse 93   Temp (!) 102 F (38.9 C) (Oral)   Resp (!) 41   Ht 5\' 5"  (1.651 m)   Wt (!) 141.7 kg   LMP 10/28/2011   SpO2 99%   BMI 51.98 kg/m   I/O last 3 completed shifts: In: 360 [P.O.:360] Out: 1200 [Urine:1200]         PHYSICAL EXAM: Deferred since she had just been examined by hospitalist Does not appear to have accessory muscle use, abdominal breathing seems to have settled down with high flow nasal cannula, oxygen saturation 98% on 15 L Decreased breath sounds per hospitalist exam, no JVD or pedal edema   RESOLVED PROBLEM LIST   ASSESSMENT AND PLAN   Appears to be Covid pneumonia rather than heart failure  Acute hypoxic respiratory failure-use heated high flow nasal cannula and this seems to have decreased her work of breathing -For transport to [facility], we can use high flow with nonrebreather if required   COVID-19 positive test (U07.1, COVID-19) with Acute Pneumonia (J12.89, Other viral pneumonia) -Start dexamethasone and remdesivir -Inflammatory markers for [facility] -Lovenox for DVT prophylaxis  Chronic systolic heart failure -Lasix 40 every 12 today and can transition to once daily. Continue Coreg and Entresto and Aldactone    SUMMARY OF TODAY'S PLAN:  She has stabilized after transfer to ICU, can transfer to [facility] once bed available  [employer] / Goals of Care / Disposition.   DVT PROPHYLAXIS: Lovenox SUP: Pepcid NUTRITION: Diet MOBILITY: Out of bed to chair GOALS OF CARE:N/A FAMILY DISCUSSIONS: Patient informed DISPOSITION ICU  LABS  Glucose No results for input(s): GLUCAP in the last 168 hours.  BMET Recent Labs  Lab 08/12/2019 1140 08/15/19 0342  NA 135 134*  K 3.4* 3.7  CL 104 101  CO2 21* 22  BUN 15 19  CREATININE 0.74 1.06*  GLUCOSE 114* 116*    Liver Enzymes Recent Labs  Lab 08/01/2019 1140 08/15/19 0342  AST 13* 16  ALT 15 15  ALKPHOS 64 56  BILITOT 1.4* 1.3*  ALBUMIN 3.4* 3.2*    Electrolytes Recent Labs  Lab 08/12/2019 1140 08/15/19 0342  CALCIUM 8.5* 8.3*    CBC Recent Labs  Lab 08/03/2019 1140 08/15/19 0342  WBC 10.8* 9.2  HGB 12.9 13.3  HCT 41.0 43.1  PLT 144* 149*    ABG No results for input(s): PHART, PCO2ART,  PO2ART in the last 168 hours.  Coag's No results for input(s): APTT, INR in the last 168 hours.  Sepsis Markers Recent Labs  Lab 08/13/2019 1033 07/22/2019 1140 08/03/2019 1702  LATICACIDVEN  --  0.8 1.1  PROCALCITON <0.10  --   --     Cardiac Enzymes No results for input(s): TROPONINI, PROBNP in the last 168 hours.  PAST MEDICAL HISTORY :   She  has a past medical history of Anemia, Chronic combined systolic and diastolic heart failure ([place]) (07/05/2017), Hyperlipidemia, Hypertension, Morbid obesity ([place]), and Thyroid disease (hyperthyroidism).  PAST SURGICAL HISTORY:  She  has a past surgical history that includes Cesarean section (12/07/79); Cesarean section (05/17/81); Cesarean section (09/06/89); Tubal ligation; RATH/BSO/cystoscopy; and RIGHT/LEFT HEART CATH AND CORONARY ANGIOGRAPHY (N/A, 07/09/2017).  No Known Allergies  No current facility-administered medications on file prior to encounter.    Current Outpatient Medications on File Prior to Encounter  Medication Sig  . albuterol (PROVENTIL HFA;VENTOLIN HFA) 108 (90 Base) MCG/ACT inhaler Inhale 2 puffs into the lungs every 6 (six) hours as needed for wheezing or shortness of breath.  Marland Kitchen aspirin EC 81 MG tablet Take 81 mg by mouth daily.  Marland Kitchen atorvastatin (LIPITOR) 80 MG tablet Take 1 tablet (80 mg total) by mouth daily.  . carvedilol (COREG) 12.5 MG tablet Take 1 tablet (12.5 mg total) by mouth 2 (two) times daily.  . furosemide (LASIX) 20 MG tablet Take 1 tablet (20 mg total) by mouth daily as needed for fluid or edema.  Marland Kitchen levothyroxine (SYNTHROID, LEVOTHROID) 125 MCG tablet Take 1 tablet (125 mcg total) by mouth daily.  . sacubitril-valsartan (ENTRESTO) 97-103 MG Take 1 tablet by mouth 2 (two) times daily.  Marland Kitchen spironolactone (ALDACTONE) 25 MG tablet Take 1 tablet (25 mg total) by mouth daily.    FAMILY HISTORY:   Her family history includes Heart disease in her mother; Hypertension in her father and sister.  SOCIAL HISTORY:   She  reports that she has never smoked. She has never used smokeless tobacco. She reports that she does not drink alcohol or use drugs.  REVIEW OF SYSTEMS:    Constitutional: negative for anorexia, fevers and sweats  Eyes: negative for irritation, redness and visual disturbance  Ears, nose, mouth, throat, and face: negative for earaches, epistaxis, nasal congestion and sore throat  Respiratory: negative for sputum and wheezing  Cardiovascular: negative for chest pain, lower extremity edema, palpitations and syncope  Gastrointestinal: negative for abdominal pain, constipation, melena, nausea and vomiting  Genitourinary:negative for dysuria, frequency and hematuria  Hematologic/lymphatic: negative for bleeding, easy bruising and lymphadenopathy  Musculoskeletal:negative for arthralgias, muscle weakness and stiff joints  Neurological: negative for coordination problems, gait problems, headaches and weakness  Endocrine: negative for diabetic symptoms including polydipsia, polyuria and weight loss    Kara Mead MD. FCCP. [facility]  If no response to pager , please call [phone]  08/15/2019      

## 2019-08-15 NOTE — Progress Notes (Signed)
RT received call from RN stating PT Sp02  low while on supplemental oxygen . RT provided RN in room a high flow nasal cannula system and advised that it can utilized up to 15 lpm 02. MD states he is ok with Sp02 >=85%- RN aware.

## 2019-08-15 NOTE — Progress Notes (Addendum)
Pharmacy: Remdesivir   Brittney Ross is a(n) 58 y.o. female admitted with COVID.  Pharmacy has been consulted for remdesivir and tocilizumab dosing.   For Remdesivir: . ALT < 220: yes Must meet ONE of the following: . Evidence of pneumonia on CXR: no . Supplemental O2 requirement to maintain > 94% saturation: yes  For Tocilizumab:  No longer recommended for routine use in COVID, but [name] has continued to consider use with elevated inflammatory markers, particularly CRP  Plan:  . Remdesivir 200 mg IV once followed by 100 mg IV daily x 4 days . Tocilizumab 800 mg IV x1  . Daily CMET while on remdesivir . Follow ALT and clinical condition   Reuel Boom, PharmD, BCPS [phone] 08/15/2019, 9:56 AM

## 2019-08-15 NOTE — Progress Notes (Signed)
Rapid Response Event Note  Overview: called to patients room for low oxygen saturation.     Initial Focused Assessment: Upon arrival to room, patient laying on side in bed, RR mid to high 40's, HR low 100's. Breath sounds diminished throughout, short, shallow respirations, patient reported breathing "better" since placed on 15L.      Interventions: patient currently on 15 L HFNC, 60 mg IV lasix administered per previous MD order.   Plan of Care (if not transferred): transfer to SDU room 1223.    Jaynie Bream

## 2019-08-15 NOTE — Progress Notes (Signed)
PROGRESS NOTE    Brittney Ross  [id number] DOB: 1961-03-10 DOA: 08/19/2019 PCP: Azzie Glatter, FNP   Brief Narrative:  HPI per Dr. Landis Gandy on 08/02/2019 Brittney Ross is a 58 y.o. 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  She denies any fever or chills.  She denies any sick contacts.  She works in a group home.  She lives at home with her husband.  Nobody at home is sick and nobody in the group home is also sick per patient. She reports taking her medications as prescribed but she does admit to dietary noncompliance. She denies weight gain or chest pain or nausea vomiting  abdominal pain or urinary complaints.  She did say she had diarrhea yesterday which has been resolved.  She does not have oxygen at home. She denies loss of smell or taste denies travel or sick contacts.  ED Course: Lasix 60 mg x 1 given. Blood pressure 174/73 pulse is 80 respiration 24 saturation 98% on 3 L of oxygen.  Upon arrival she was tachypneic at 31 and hypoxic at 88% on room air.  Temperature 98.9. Chest x-ray reviewed by me pulmonary vascular congestion noted left costophrenic angle blunted Sodium 135 potassium 3.4 BUN 15 creatinine 0.74 ABG 7.4 2/37/50 1/98% White count 10.8 hemoglobin 12.9 platelet count 144 BNP 314 lactic acid 0.8 Total bilirubin 1.4 Albumin 3.4 AST 13 ALT 15  **Interim History  Patient had increased work of breathing and had to go up on her oxygen requirements.  She is breathing in the 40s to 50s and had some respiratory distress this morning.  She was transferred to the stepdown unit and placed on a nonrebreather and is improved.  Also given diuresis with IV Lasix.  Because of her worsening respiratory status she is started on Covid treatment with  IV Decadron, remdesivir as well as Actemra. She will go to [facility] when bed is available.  Assessment & Plan:   Active Problems:   CHF (congestive heart failure) (HCC)   Leukocytosis   Thrombocytopenia (HCC)  Acute hypoxic respiratory failure likely secondary to COVID-19 disease with concomitant combined systolic and diastolic CHF exacerbation -ABG done yesterday and showed a pH of 7.421, PCO2 37.6, PO2 of 51.8, bicarbonate level 24.0, and O2 saturation of 86.6% -Patient's O2 requirement significantly worsened and she had to be placed on a nonrebreather -Continue supplemental oxygen and wean O2 as tolerated -Pulmonary consulted for further evaluation recommendations -Continuous pulse oximetry and maintain O2 saturation greater than 85% -X-ray done and showed bilateral interstitial thickening and no pleural effusion or pneumothorax.  There is no focal consolidation and stable cardiomegaly with no acute osseous abnormality.  Overall impression was read as cardiomegaly with mild pulmonary vascular congestion however I feel that this is likely related to her COVID-19 disease -Continue with diuresis and given IV 60 mg went to the ED yesterday given 40 milligrams IV this a.m. and given additional 20 and rapid response was called -Continue to monitor respiratory status carefully and will transfer to [facility] -Per pulmonary use heated high flow nasal cannula to help her work of breathing -Transfer to [facility] with-we will add nonrebreather if required  Acute on chronic combined systolic and diastolic CHF exacerbation -BNP on admission was 314 -Last echocardiogram  done was in May 2019 showed an EF of 35 to 40% with study not sufficient to allow left ventricular diastolic dysfunction evaluation -Chest x-ray was read as cardiomegaly with mild pulmonary vascular congestion -Continue with diuresis for today with monitoring of renal function; given  IV 6 this morning and the next dose will be IV 40 mg and then transition to once daily; she takes 20 mg of Lasix at home daily -Repeat echocardiogram -Troponin levels were flat and not elevated as they were 12 and 13 respectively -Strict I's and O's and daily weights -Continue spironolactone 25 mg p.o. daily, Entresto 97-103 milligrams per tablet with 1 tab p.o. twice daily -TSH was 1.465 -Continue with carvedilol 12.5 mg p.o. twice daily -Strict I's and O's and daily weights -Continue monitor for signs and symptoms of volume overload -Repeat chest x-ray in a.m.  COVID-19 Disease, respiratory distress and and Suspected Pneumonia -Patient is hypoxic and has chest x-ray findings though they were attributed to CHF I feel that this is secondary to COVID-19 and pneumonia -Started on dexamethasone and given 10 mg IV once and start 6 mg every 24 hours for at least 10 days -Continue to keep her on the dry side -Also started remdesivir and Actemra given her worsening respiratory status -Checked inflammatory markers and showed an LDH of 200 ferritin of 435, CRP of 11.1, lactic acid of 1.1, procalcitonin level of less than 0.10, 0.00 -Continue supplemental oxygen as above -Appreciated pulmonary evaluation -Continue Lovenox for VT prophylaxis  Super Morbid Obesity -Estimated body mass index is 51.98 kg/m as calculated from the following:   Height as of this encounter: 5\' 5"  (1.651 m).   Weight as of this encounter: 141.7 kg. -Weight Loss and Dietary Counseling given   Hyperlipidemia -Patient's lipid panel was done and showed a total cholesterol/HDL ratio of 3.4, cholesterol level 187, HDL 55, LDL 120, triglycerides of 60, and VLDL 12-continue with her home atorvastatin  AKI -Mild and in the setting of diuresis -Patient's BUN/creatinine went from 15/0.74 is now 90/1.06 -Continue to monitor renal function and trend -Avoid nephrotoxic medications, contrast dyes as well as hypotension if  possible -Continue to monitor and repeat CMP in a.m.  Hyperbilirubinemia -Patient's T bili was 1.4 admission is now decreased down to 1.3 -Likely reactive -Continue to monitor and trend and repeat CMP in a.m.  Hyponatremia -Mild in the setting of diuresis -The patient's sodium was 134 -Continue to monitor and trend repeat CMP in a.m.  Hypothyroidism -Checked TSH and was 1.465  -continue with Levothyroxine 125 mcg po Daily   Diabetes Mellitus Type 2 -Had an elevated hemoglobin A1c April 2020 of 6.6 and should have been diagnosed then -Repeat hemoglobin A1c now 7.0 -Continue to monitor CBGs carefully and will place the patient on a sensitive NovoLog/scale insulin AC as she is insulin nave and will adjust accordingly -Continue monitor CBGs carefully  History of CAD -Was mild -Continue with ASA 81 mg p.o. daily, atorvastatin 80 mg p.o. daily, carvedilol 12.5 mg p.o. twice daily, and Entresto  Thrombocytopenia Mild and patient's platelet count went from 144,000 and is now 149,000 -Continue to monitor for signs and symptoms of bleeding as patient is now on subcu Lovenox -Continue monitor repeat CBC in a.m.  Hypokalemia -Improved -Patient's potassium is now 3.7 -Continue to monitor and replete as necessary -Repeat CMP in a.m.  Hypertension -Continue with Entresto and Carvedilol  DVT prophylaxis: Enoxaparin 60 mg q24h Code Status: FULL CODE Family Communication: No family present at bedside  Disposition Plan: Transferred to SDU due to worsened Respiratory Status and Transfer to [facility] when bed is available   Consultants:   Pulmonary Critical Care   Procedures:  ECHOCARDIOGRAM    Antimicrobials:  Anti-infectives (From admission, onward)   Start     Dose/Rate Route Frequency Ordered Stop   08/16/19 1000  remdesivir 100 mg in sodium chloride 0.9 % 250 mL IVPB     100 mg 500 mL/hr over 30 Minutes Intravenous Every 24 hours 08/15/19 1004 08/20/19 0959   08/15/19 1100   remdesivir 200 mg in sodium chloride 0.9 % 250 mL IVPB     200 mg 500 mL/hr over 30 Minutes Intravenous Once 08/15/19 1004 08/15/19 1200     Subjective: Patient examined at bedside and she was in some respiratory distress with respiration in the 40s and 50s.  States that she felt short of breath.  No nausea or vomiting.  Thinks her swelling in her legs is improved.  No other concerns or plans at this time except that it was a little difficult for her to breathe.  Objective: Vitals:   08/16/2019 2123 08/09/2019 2300 08/15/19 0114 08/15/19 0508  BP: (!) 124/56 (!) 147/81  (!) 135/52  Pulse: 80 82  84  Resp: 20 20  20   Temp: 100.1 F (37.8 C) (!) 101.1 F (38.4 C) 99.1 F (37.3 C) 100 F (37.8 C)  TempSrc: Oral Oral Oral Oral  SpO2: 98% 100%  96%  Weight:    (!) 143 kg  Height:        Intake/Output Summary (Last 24 hours) at 08/15/2019 0825 Last data filed at 08/15/2019 [id number] Gross per 24 hour  Intake 360 ml  Output 1200 ml  Net -840 ml   Filed Weights   08/18/2019 1050 08/15/19 0508  Weight: (!) 144 kg (!) 143 kg   Examination: Physical Exam:  Constitutional: WN/WD super morbidly obese in some respiratory distress appears anxious and uncomfortable Eyes: Lids and conjunctivae normal, sclerae anicteric  ENMT: External Ears, Nose appear normal. Grossly normal hearing. Mucous membranes are moist.  Neck: Appears normal, supple, no cervical masses, normal ROM, no appreciable thyromegaly; no appreciable JVD but difficult to assess due to body habitus Respiratory: Diminished to auscultation bilaterally with coarse breath sounds, no wheezing, rales, rhonchi or crackles.  Patient is tachypneic and using some accessory muscles to breathe on nasal cannula. Cardiovascular: Heart rate was on the faster side, no murmurs / rubs / gallops. S1 and S2 auscultated.  Trace extremity edema. Abdomen: Soft, non-tender, Distended due to body habitus. Bowel sounds positive x4.  GU:  Deferred. Musculoskeletal: No clubbing / cyanosis of digits/nails. No joint deformity upper and lower extremities. .  Skin: No rashes, lesions, ulcers on a limited skin evaluation. No induration; Warm and dry.  Neurologic: CN 2-12 grossly intact with no focal deficits. Romberg sign and cerebellar reflexes not assessed.  Psychiatric: Normal judgment and insight. Alert and oriented x 3. Anxious mood and appropriate affect.   Data Reviewed: I have personally reviewed following labs and imaging studies  CBC: Recent Labs  Lab 07/22/2019 1140 08/15/19 0342  WBC 10.8* 9.2  NEUTROABS 8.8*  --   HGB 12.9 13.3  HCT 41.0 43.1  MCV 90.5 92.5  PLT 144* [id number]*   Basic Metabolic Panel: Recent Labs  Lab 08/19/2019 1140 08/15/19 0342  NA 135 134*  K 3.4* 3.7  CL 104 101  CO2 21* 22  GLUCOSE 114* 116*  BUN 15 19  CREATININE 0.74  1.06*  CALCIUM 8.5* 8.3*   GFR: Estimated Creatinine Clearance: 84.5 mL/min (A) (by C-G formula based on SCr of 1.06 mg/dL (H)). Liver Function Tests: Recent Labs  Lab 08/01/2019 1140 08/15/19 0342  AST 13* 16  ALT 15 15  ALKPHOS 64 56  BILITOT 1.4* 1.3*  PROT 7.5 7.8  ALBUMIN 3.4* 3.2*   No results for input(s): LIPASE, AMYLASE in the last 168 hours. No results for input(s): AMMONIA in the last 168 hours. Coagulation Profile: No results for input(s): INR, PROTIME in the last 168 hours. Cardiac Enzymes: No results for input(s): CKTOTAL, CKMB, CKMBINDEX, TROPONINI in the last 168 hours. BNP (last 3 results) No results for input(s): PROBNP in the last 8760 hours. HbA1C: Recent Labs    08/11/2019 1702  HGBA1C 7.0*   CBG: No results for input(s): GLUCAP in the last 168 hours. Lipid Profile: Recent Labs    08/15/19 0342  CHOL 187  HDL 55  LDLCALC 120*  TRIG 60  CHOLHDL 3.4   Thyroid Function Tests: Recent Labs    07/22/2019 1702  TSH 1.465   Anemia Panel: Recent Labs    07/22/2019 1417  FERRITIN 455*   Sepsis Labs: Recent Labs  Lab  07/30/2019 1033 08/10/2019 1140 08/07/2019 1702  PROCALCITON <0.10  --   --   LATICACIDVEN  --  0.8 1.1    Recent Results (from the past 240 hour(s))  SARS CORONAVIRUS 2 (TAT 6-24 HRS) Nasopharyngeal Nasopharyngeal Swab     Status: Abnormal   Collection Time: 08/16/2019  1:06 PM   Specimen: Nasopharyngeal Swab  Result Value Ref Range Status   SARS Coronavirus 2 POSITIVE (A) NEGATIVE Final    Comment: RESULT CALLED TO, READ BACK BY AND VERIFIED WITH: Reita Chard, RN AT [phone] ON 08/15/2019 BY SAINVILUS S (NOTE) SARS-CoV-2 target nucleic acids are DETECTED. The SARS-CoV-2 RNA is generally detectable in upper and lower respiratory specimens during the acute phase of infection. Positive results are indicative of active infection with SARS-CoV-2. Clinical  correlation with patient history and other diagnostic information is necessary to determine patient infection status. Positive results do  not rule out bacterial infection or co-infection with other viruses. The expected result is Negative. Fact Sheet for Patients: SugarRoll.be Fact Sheet for Healthcare Providers: https://www.woods-mathews.com/ This test is not yet approved or cleared by the [place] FDA and  has been authorized for detection and/or diagnosis of SARS-CoV-2 by FDA under an Emergency Use Authorization (EUA). This EUA will remain  in effect (meaning this test  can be used) for the duration of the COVID-19 declaration under Section 564(b)(1) of the Act, 21 U.S.C. section 360bbb-3(b)(1), unless the authorization is terminated or revoked sooner. Performed at [facility], [place] [address] [place], [place] [address]     Radiology Studies: Dg Chest Port 1 View  Result Date: 08/13/2019 CLINICAL DATA:  Shortness of breath for 2 days EXAM: PORTABLE CHEST 1 VIEW COMPARISON:  07/05/2017 FINDINGS: Bilateral interstitial thickening. No pleural effusion or pneumothorax. No focal  consolidation. Stable cardiomegaly. No acute osseous abnormality. IMPRESSION: Cardiomegaly with mild pulmonary vascular congestion. Electronically Signed   By: Kathreen Devoid   On: 08/02/2019 11:48   Scheduled Meds:  aspirin EC  81 mg Oral Daily   atorvastatin  80 mg Oral Daily   carvedilol  12.5 mg Oral BID   dexamethasone (DECADRON) injection  10 mg Intravenous Once   [START ON 08/16/2019] dexamethasone (DECADRON) injection  6 mg Intravenous Q24H   enoxaparin (LOVENOX)  injection  60 mg Subcutaneous Q24H   furosemide  40 mg Intravenous BID   levothyroxine  125 mcg Oral QAC breakfast   mouth rinse  15 mL Mouth Rinse BID   sacubitril-valsartan  1 tablet Oral BID   spironolactone  25 mg Oral Daily   Continuous Infusions:   LOS: 1 day   The Patient is critically ill with multiple organ system failure and requires high complexity decision making for assessment and support, frequent evaluation and titration of therapies and application of advanced monitoring technologies and extensive interpretation of multiple databases  CRITICAL CARE TIME SPENT: 42 Minutes of Nonconsecutive Minutes Devoted to Patient Care Services Described in this note including but not limited to Reviewing Chart, Seeing Patient, Coordinating Care, Updating Family and Discussing with Consultants  Kerney Elbe, DO [facility] PAGER is on AMION  If 7PM-7AM, please contact night-coverage [email or web address] Password [email or web address] 08/15/2019, 8:25 AM

## 2019-08-15 NOTE — Progress Notes (Signed)
  Echocardiogram 2D Echocardiogram has been performed.  Brittney Ross 08/15/2019, 1:57 PM

## 2019-08-15 NOTE — TOC Initial Note (Signed)
Transition of Care [facility]) - Initial/Assessment Note    Patient Details  Name: Brittney Ross MRN: [id number] Date of Birth: 1961-06-10  Transition of Care [facility]) CM/SW Contact:    Purcell Mouton, RN Phone Number: 08/15/2019, 12:44 PM  Clinical Narrative:                 Pt admitted with cco SOB and cough for 2 days. Pt lives with husband.   Expected Discharge Plan: Home/Self Care Barriers to Discharge: No Barriers Identified   Patient Goals and CMS Choice Patient states their goals for this hospitalization and ongoing recovery are:: To get better and go home.      Expected Discharge Plan and Services Expected Discharge Plan: Home/Self Care   Discharge Planning Services: CM Consult   Living arrangements for the past 2 months: Single Family Home                                      Prior Living Arrangements/Services Living arrangements for the past 2 months: Single Family Home Lives with:: Spouse Patient language and need for interpreter reviewed:: No Do you feel safe going back to the place where you live?: Yes               Activities of Daily Living Home Assistive Devices/Equipment: None ADL Screening (condition at time of admission) Patient's cognitive ability adequate to safely complete daily activities?: Yes Is the patient deaf or have difficulty hearing?: No Does the patient have difficulty seeing, even when wearing glasses/contacts?: No Does the patient have difficulty concentrating, remembering, or making decisions?: No Patient able to express need for assistance with ADLs?: Yes Does the patient have difficulty dressing or bathing?: No Independently performs ADLs?: Yes (appropriate for developmental age) Does the patient have difficulty walking or climbing stairs?: No Weakness of Legs: Both Weakness of Arms/Hands: None  Permission Sought/Granted                  Emotional Assessment Appearance:: Appears stated age     Orientation:  : Oriented to Self, Oriented to Place, Oriented to  Time, Oriented to Situation      Admission diagnosis:  SOB (shortness of breath) [R06.02] Hypoxia [R09.02] Patient Active Problem List   Diagnosis Date Noted  . CHF (congestive heart failure) ([place]) 07/27/2019  . Leukocytosis 08/06/2019  . Thrombocytopenia ([place]) 08/13/2019  . Hypoxia   . SOB (shortness of breath)   . Coronary artery disease involving native coronary artery of native heart without angina pectoris 12/30/2017  . HTN (hypertension) 09/14/2017  . Dilated cardiomyopathy ([place])   . Hypokalemia 07/05/2017  . Chronic combined systolic and diastolic heart failure ([place]) 07/05/2017  . Acute pulmonary edema (HCC)   . Chronic female pelvic pain 08/14/2011  . H/O Graves' disease 03/20/2010  . 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 Obesity 03/20/2008  . Hypertensive heart disease with heart failure ([place]) 03/20/2008  . HYPERCHOLESTEROLEMIA 02/22/2008  . MIGRAINE HEADACHE 02/21/2008  . KNEE PAIN, RIGHT 02/21/2008  . FIBROIDS, UTERUS 02/06/2008  . ANEMIA 02/06/2008   PCP:  Azzie Glatter, FNP Pharmacy:   [facility] [address] [place]), [place] - [id number] [place] [address] Phone: [phone] Fax: [phone]     Social Determinants of Health (SDOH) Interventions    Readmission Risk Interventions No flowsheet data found.

## 2019-08-15 NOTE — Progress Notes (Signed)
Report given to receiving nurse at [place], [place]. Patient and family updated on progress and status.  All questions answered.

## 2019-08-15 NOTE — Progress Notes (Signed)
Patient reassessed. Oxygen decreased 86 % on 4 liters RR 48. O2 increased 6 lt without resolve and the 8 lt 88-90%. High flow requested. High flow applied sat 95-97%.

## 2019-08-16 ENCOUNTER — Inpatient Hospital Stay (HOSPITAL_COMMUNITY): Payer: BLUE CROSS/BLUE SHIELD

## 2019-08-16 DIAGNOSIS — G9340 Encephalopathy, unspecified: Secondary | ICD-10-CM

## 2019-08-16 DIAGNOSIS — J9601 Acute respiratory failure with hypoxia: Secondary | ICD-10-CM | POA: Diagnosis not present

## 2019-08-16 DIAGNOSIS — I5041 Acute combined systolic (congestive) and diastolic (congestive) heart failure: Secondary | ICD-10-CM | POA: Diagnosis not present

## 2019-08-16 DIAGNOSIS — J810 Acute pulmonary edema: Secondary | ICD-10-CM

## 2019-08-16 LAB — POCT I-STAT 7, (LYTES, BLD GAS, ICA,H+H)
Acid-base deficit: 7 mmol/L — ABNORMAL HIGH (ref 0.0–2.0)
Bicarbonate: 17.7 mmol/L — ABNORMAL LOW (ref 20.0–28.0)
Calcium, Ion: 1.09 mmol/L — ABNORMAL LOW (ref 1.15–1.40)
HCT: 41 % (ref 36.0–46.0)
Hemoglobin: 13.9 g/dL (ref 12.0–15.0)
O2 Saturation: 68 %
Patient temperature: 36.8
Potassium: 4.4 mmol/L (ref 3.5–5.1)
Sodium: 136 mmol/L (ref 135–145)
TCO2: 19 mmol/L — ABNORMAL LOW (ref 22–32)
pCO2 arterial: 31.9 mmHg — ABNORMAL LOW (ref 32.0–48.0)
pH, Arterial: 7.35 (ref 7.350–7.450)
pO2, Arterial: 36 mmHg — CL (ref 83.0–108.0)

## 2019-08-16 LAB — COMPREHENSIVE METABOLIC PANEL
ALT: 19 U/L (ref 0–44)
AST: 31 U/L (ref 15–41)
Albumin: 3.1 g/dL — ABNORMAL LOW (ref 3.5–5.0)
Alkaline Phosphatase: 54 U/L (ref 38–126)
Anion gap: 11 (ref 5–15)
BUN: 49 mg/dL — ABNORMAL HIGH (ref 6–20)
CO2: 22 mmol/L (ref 22–32)
Calcium: 8.2 mg/dL — ABNORMAL LOW (ref 8.9–10.3)
Chloride: 103 mmol/L (ref 98–111)
Creatinine, Ser: 1.25 mg/dL — ABNORMAL HIGH (ref 0.44–1.00)
GFR calc Af Amer: 55 mL/min — ABNORMAL LOW (ref >60)
GFR calc non Af Amer: 48 mL/min — ABNORMAL LOW (ref >60)
Glucose, Bld: 177 mg/dL — ABNORMAL HIGH (ref 70–99)
Potassium: 4 mmol/L (ref 3.5–5.1)
Sodium: 136 mmol/L (ref 135–145)
Total Bilirubin: 0.9 mg/dL (ref 0.3–1.2)
Total Protein: 7.7 g/dL (ref 6.5–8.1)

## 2019-08-16 LAB — GLUCOSE, CAPILLARY
Glucose-Capillary: 186 mg/dL — ABNORMAL HIGH (ref 70–99)
Glucose-Capillary: 180 mg/dL — ABNORMAL HIGH (ref 70–99)
Glucose-Capillary: 162 mg/dL — ABNORMAL HIGH (ref 70–99)
Glucose-Capillary: 200 mg/dL — ABNORMAL HIGH (ref 70–99)
Glucose-Capillary: 184 mg/dL — ABNORMAL HIGH (ref 70–99)

## 2019-08-16 LAB — CBC WITH DIFFERENTIAL/PLATELET
Abs Immature Granulocytes: 0.05 10*3/uL (ref 0.00–0.07)
Basophils Absolute: 0 10*3/uL (ref 0.0–0.1)
Basophils Relative: 0 %
Eosinophils Absolute: 0 10*3/uL (ref 0.0–0.5)
Eosinophils Relative: 0 %
HCT: 41.6 % (ref 36.0–46.0)
Hemoglobin: 13.3 g/dL (ref 12.0–15.0)
Immature Granulocytes: 0 %
Lymphocytes Relative: 7 %
Lymphs Abs: 0.9 10*3/uL (ref 0.7–4.0)
MCH: 28.8 pg (ref 26.0–34.0)
MCHC: 32 g/dL (ref 30.0–36.0)
MCV: 90 fL (ref 80.0–100.0)
Monocytes Absolute: 0.6 10*3/uL (ref 0.1–1.0)
Monocytes Relative: 5 %
Neutro Abs: 11.2 10*3/uL — ABNORMAL HIGH (ref 1.7–7.7)
Neutrophils Relative %: 88 %
Platelets: 154 10*3/uL (ref 150–400)
RBC: 4.62 MIL/uL (ref 3.87–5.11)
RDW: 14.7 % (ref 11.5–15.5)
WBC: 12.8 10*3/uL — ABNORMAL HIGH (ref 4.0–10.5)
nRBC: 0 % (ref 0.0–0.2)

## 2019-08-16 LAB — POCT I-STAT EG7
Acid-base deficit: 2 mmol/L (ref 0.0–2.0)
Bicarbonate: 21 mmol/L (ref 20.0–28.0)
Calcium, Ion: 1.1 mmol/L — ABNORMAL LOW (ref 1.15–1.40)
HCT: 42 % (ref 36.0–46.0)
Hemoglobin: 14.3 g/dL (ref 12.0–15.0)
O2 Saturation: 75 %
Potassium: 3.7 mmol/L (ref 3.5–5.1)
Sodium: 135 mmol/L (ref 135–145)
TCO2: 22 mmol/L (ref 22–32)
pCO2, Ven: 31.7 mmHg — ABNORMAL LOW (ref 44.0–60.0)
pH, Ven: 7.429 (ref 7.250–7.430)
pO2, Ven: 38 mmHg (ref 32.0–45.0)

## 2019-08-16 LAB — FERRITIN: Ferritin: 1159 ng/mL — ABNORMAL HIGH (ref 11–307)

## 2019-08-16 LAB — PHOSPHORUS: Phosphorus: 3.5 mg/dL (ref 2.5–4.6)

## 2019-08-16 LAB — LACTATE DEHYDROGENASE: LDH: 412 U/L — ABNORMAL HIGH (ref 98–192)

## 2019-08-16 LAB — C-REACTIVE PROTEIN: CRP: 21.5 mg/dL — ABNORMAL HIGH (ref <1.0)

## 2019-08-16 LAB — MAGNESIUM: Magnesium: 2.5 mg/dL — ABNORMAL HIGH (ref 1.7–2.4)

## 2019-08-16 LAB — FIBRINOGEN: Fibrinogen: 689 mg/dL — ABNORMAL HIGH (ref 210–475)

## 2019-08-16 LAB — D-DIMER, QUANTITATIVE: D-Dimer, Quant: 0.94 ug/mL-FEU — ABNORMAL HIGH (ref 0.00–0.50)

## 2019-08-16 MED ORDER — PROPOFOL 10 MG/ML IV BOLUS
INTRAVENOUS | Status: AC
  Filled 2019-08-16: qty 20

## 2019-08-16 MED ORDER — SALINE SPRAY 0.65 % NA SOLN
1.0000 | NASAL | Status: DC | PRN
  Filled 2019-08-16: qty 44

## 2019-08-16 MED ORDER — MIDAZOLAM HCL 2 MG/2ML IJ SOLN
INTRAMUSCULAR | Status: AC
  Filled 2019-08-16: qty 4

## 2019-08-16 MED ORDER — METOLAZONE 10 MG PO TABS
10.0000 mg | ORAL_TABLET | Freq: Once | ORAL | Status: DC
  Filled 2019-08-16: qty 1

## 2019-08-16 MED ORDER — FUROSEMIDE 10 MG/ML IJ SOLN
40.0000 mg | Freq: Four times a day (QID) | INTRAMUSCULAR | Status: AC
  Administered 2019-08-16 – 2019-08-17 (×3): 40 mg via INTRAVENOUS
  Filled 2019-08-16 (×3): qty 4

## 2019-08-16 MED ORDER — ETOMIDATE 2 MG/ML IV SOLN
INTRAVENOUS | Status: AC
  Filled 2019-08-16: qty 20

## 2019-08-16 MED ORDER — TRAMADOL HCL 50 MG PO TABS
50.0000 mg | ORAL_TABLET | Freq: Four times a day (QID) | ORAL | Status: DC | PRN
  Administered 2019-08-16 – 2019-08-19 (×3): 50 mg via ORAL
  Filled 2019-08-16 (×3): qty 1

## 2019-08-16 MED ORDER — OXYCODONE HCL 5 MG PO TABS: 5.0000 mg | ORAL_TABLET | ORAL | Status: DC | PRN

## 2019-08-16 MED ORDER — METOLAZONE 10 MG PO TABS
10.0000 mg | ORAL_TABLET | Freq: Once | ORAL | Status: AC
  Administered 2019-08-16: 17:00:00 10 mg via ORAL
  Filled 2019-08-16: qty 1

## 2019-08-16 MED ORDER — ROCURONIUM BROMIDE 10 MG/ML (PF) SYRINGE
PREFILLED_SYRINGE | INTRAVENOUS | Status: AC
  Filled 2019-08-16: qty 10

## 2019-08-16 MED ORDER — STERILE WATER FOR INJECTION IJ SOLN
INTRAMUSCULAR | Status: AC
  Filled 2019-08-16: qty 10

## 2019-08-16 MED ORDER — ENOXAPARIN SODIUM 80 MG/0.8ML ~~LOC~~ SOLN
70.0000 mg | Freq: Two times a day (BID) | SUBCUTANEOUS | Status: DC
  Administered 2019-08-16 – 2019-08-17 (×3): 70 mg via SUBCUTANEOUS
  Filled 2019-08-16 (×3): qty 0.8

## 2019-08-16 MED ORDER — PANTOPRAZOLE SODIUM 40 MG IV SOLR
40.0000 mg | INTRAVENOUS | Status: DC
  Administered 2019-08-16 – 2019-08-18 (×3): 40 mg via INTRAVENOUS
  Filled 2019-08-16 (×3): qty 40

## 2019-08-16 MED ORDER — SUCCINYLCHOLINE CHLORIDE 200 MG/10ML IV SOSY
PREFILLED_SYRINGE | INTRAVENOUS | Status: AC
  Filled 2019-08-16: qty 10

## 2019-08-16 MED ORDER — LIP MEDEX EX OINT
TOPICAL_OINTMENT | CUTANEOUS | Status: DC | PRN
  Administered 2019-08-20: 17:00:00 via TOPICAL
  Administered 2019-08-21: 1 via TOPICAL
  Filled 2019-08-16: qty 7

## 2019-08-16 MED ORDER — FENTANYL CITRATE (PF) 100 MCG/2ML IJ SOLN
INTRAMUSCULAR | Status: AC
  Filled 2019-08-16: qty 2

## 2019-08-16 MED ORDER — VECURONIUM BROMIDE 10 MG IV SOLR
INTRAVENOUS | Status: AC
  Filled 2019-08-16: qty 10

## 2019-08-16 MED ORDER — INSULIN GLARGINE 100 UNIT/ML ~~LOC~~ SOLN
12.0000 [IU] | Freq: Every day | SUBCUTANEOUS | Status: DC
  Administered 2019-08-16 – 2019-08-23 (×8): 12 [IU] via SUBCUTANEOUS
  Filled 2019-08-16 (×8): qty 0.12

## 2019-08-16 MED ORDER — POTASSIUM CHLORIDE CRYS ER 20 MEQ PO TBCR
40.0000 meq | EXTENDED_RELEASE_TABLET | Freq: Three times a day (TID) | ORAL | Status: AC
  Administered 2019-08-16 (×2): 40 meq via ORAL
  Filled 2019-08-16 (×2): qty 2

## 2019-08-16 MED ORDER — CARVEDILOL 6.25 MG PO TABS
6.2500 mg | ORAL_TABLET | Freq: Two times a day (BID) | ORAL | Status: DC
  Administered 2019-08-16 – 2019-08-18 (×3): 6.25 mg via ORAL
  Filled 2019-08-16 (×6): qty 1

## 2019-08-16 NOTE — Progress Notes (Signed)
Paged Dr. Shanon Brow via [facility] to make her aware patient has arrived in ICU from [facility].

## 2019-08-16 NOTE — Progress Notes (Signed)
Critical ABG results given to RN. Patient currently on 30L 100%, with NRB. Spo2 90%.

## 2019-08-16 NOTE — Progress Notes (Signed)
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  She did not have fevers or sick contacts he did have one episode of loose stools.  She lives at home with her husband. Admission chest x-ray showed cardiomegaly and bilateral interstitial infiltrates.  Oxygen saturation was 88% on room air which improved on 3 L.  She was afebrile.  Overnight oxygen requirements have increased to 8 L oxygen this morning and she was tachypneic hence transferred to the ICU and PCCM is consulted.  Admission labs significant for BNP of 3149 lymphopenia She was given Lasix and diuresed 200 cc  SIGNIFICANT PAST MEDICAL HISTORY   Chronic systolic heart failure EF 30 to 35% Hypertension, hypothyroidism, hyperlipidemia, obesity  SIGNIFICANT EVENTS:  10/26 transferred to ICU  STUDIES:   Echo 02/2018 EF 35 to 40%  CULTURES:  SARS COV-2 10/25 >> POS  ANTIBIOTICS:    LINES/TUBES:    CONSULTANTS:   SUBJECTIVE:  Feels better on HFNC  CONSTITUTIONAL: BP (!) 118/42   Pulse 70   Temp 98 F (36.7 C) (Oral)   Resp (!) 22   Ht 5\' 5"  (1.651 m)   Wt (!) 141.1 kg   LMP 10/28/2011   SpO2 90%   BMI 51.76 kg/m   I/O last 3 completed shifts: In: 360 [P.O.:360] Out: 800 [Urine:800]  FiO2 (%):  [100 %] 100 %  PHYSICAL EXAM: General: morbidly obese,  chronically ill appearing, moderate respiratory distress HEENT: [place]/AT, PERRL, EOM-I and MMM Heart: RRR, Nl S1/S2 and -M/R/G Lung: decreased diffusely Abdomen: Very obese, soft, NT, ND and +BS Ext: -edema and -tenderness Neuro: alert and interactive, moving all ext to command  I reviewed CXR myself, barely any lungs are visible with diffuse infiltrate  Discussed with TRH-MD   RESOLVED PROBLEM LIST   ASSESSMENT AND PLAN    Acute hypoxic respiratory failure-use heated high flow nasal cannula and this seems to have decreased her work of breathing - HFNC as ordered - Active diureses with lasix and zaroxolyn - Monitor I/O - Continue decadron - ? remdesivir  - Lovenox COVID dose, will increase - Titrate O2 for sat of 85 or higher - No intubation for now - Monitor in the ICU given high chances of deterioration  COVID-19 positive test (U07.1, COVID-19) with Acute Pneumonia (J12.89, Other viral pneumonia) - Dexamethasone and remdesivir - Inflammatory markers for [facility] - Increase anticoagulation to COVID dose  Chronic systolic heart failure -Lasix 40 every 12 today and can transition to once daily. Continue Coreg and Entresto and Aldactone  SUMMARY OF TODAY'S PLAN:  She has stabilized after transfer to ICU, can transfer to [facility] once bed available  [employer] / Goals of Care / Disposition.   DVT PROPHYLAXIS: Lovenox SUP: Pepcid NUTRITION: Diet MOBILITY: Out of bed to chair GOALS OF CARE:N/A FAMILY DISCUSSIONS: Patient informed DISPOSITION ICU  LABS  Glucose Recent Labs  Lab 08/15/19 1817 08/16/19 0600 08/16/19 0831  GLUCAP 192* 186* 180*    BMET Recent Labs  Lab 08/07/2019 1140 08/15/19 0342 08/16/19 0745  NA 135 134* 135  K 3.4* 3.7 3.7  CL 104 101  --   CO2 21* 22  --   BUN 15 19  --   CREATININE 0.74 1.06*  --   GLUCOSE 114* 116*  --     Liver Enzymes Recent Labs  Lab 08/10/2019 1140 08/15/19 0342  AST 13* 16  ALT 15 15  ALKPHOS 64 56   BILITOT 1.4* 1.3*  ALBUMIN 3.4* 3.2*    Electrolytes Recent Labs  Lab 07/26/2019 1140 08/15/19 0342  CALCIUM 8.5* 8.3*    CBC Recent Labs  Lab 07/21/2019 1140 08/15/19 0342 08/16/19 0745  WBC 10.8* 9.2  --   HGB 12.9 13.3 14.3  HCT 41.0 43.1 42.0  PLT 144* 149*  --     ABG No results for input(s): PHART, PCO2ART, PO2ART in the last 168 hours.  Coag's No results for input(s): APTT, INR in the last 168 hours.  Sepsis Markers Recent Labs  Lab 08/02/2019 1033 08/09/2019 1140 08/12/2019 1702  LATICACIDVEN  --  0.8 1.1  PROCALCITON <0.10  --   --    Cardiac Enzymes No results for input(s): TROPONINI, PROBNP in the last 168 hours.  The patient is critically ill with multiple organ systems failure and requires high complexity decision making for assessment and support, frequent evaluation and titration of therapies, application of advanced monitoring technologies and extensive interpretation of multiple databases.   Critical Care Time devoted to patient care services described in this note is  32  Minutes. This time reflects time of care of this signee Dr Jennet Maduro. This critical care time does not reflect procedure time, or teaching time or supervisory time of PA/NP/Med student/Med Resident etc but could involve care discussion time.  Rush Farmer, M.D. [facility] Pulmonary/Critical Care Medicine.

## 2019-08-16 NOTE — Progress Notes (Signed)
Paged Dr. Shanon Brow via amion with ABG results. ABG ordered per rapid response protocol after hypoxic episode.

## 2019-08-16 NOTE — Progress Notes (Signed)
Brittney Ross  [id number] DOB: June 21, 1961 DOA: 08/13/2019 PCP: Azzie Glatter, FNP    Brief Narrative:  58 year old with a history of systolic congestive heart failure (30-35% EF), HTN, HLD, obesity, and hypothyroidism who presented with complaints of severe shortness of breath with a cough which had been worsening over approximately 2 days.  Upon arrival in the ED she was found to be hypoxic at 88% on room air.  A chest x-ray suggested only pulmonary vascular congestion.  Shortly after her admission to [facility] she developed significant increased work of breathing with respiratory rates in the 40-50s.  Her Covid test subsequently returned positive and treatment was initiated with Decadron, remdesivir, and Actemra.  Significant Events: 10/25 admit to [facility] via ED 10/26 TTE 10/27 transfer to [facility] ICU  COVID-19 specific Treatment: Decadron 10/26 > Remdesivir 10/26 > Actemra 10/26  Subjective: Resting comfortably in bed but anxious.  No evidence of significant respiratory distress.  Denies chest pain nausea vomiting or abdominal pain.  Had a hard time tolerating heated high flow nasal cannula due to burning of the nose.  Assessment & Plan:  Acute hypoxic respiratory failure - COVID-19 Continue Decadron and remdesivir -was dosed with Actemra 10/26 -heated high flow nasal cannula as tolerated in ICU setting -high risk for decompensation requiring intubation -PCCM following  Acute on chronic systolic congestive heart failure Continue diuresis -continue Coreg and Entresto -continue Aldactone -EF 35 to 40% per TTE May 2019 -monitor daily weights and strict ins and outs  Filed Weights   08/15/19 0508 08/15/19 1058 08/16/19 0106  Weight: (!) 143 kg (!) 141.7 kg (!) 141.1 kg    Acute kidney injury Baseline creatinine 1.7 -continue to monitor trend  Recent Labs  Lab 08/15/2019 1140 08/15/19 0342 08/16/19 1158  CREATININE 0.74 1.06* 1.25*    Super morbid obesity  - Estimated body mass index is 51.76 kg/m as calculated from the following:   Height as of this encounter: 5\' 5"  (1.651 m).   Weight as of this encounter: 141.1 kg.  HLD Continue atorvastatin as per home Hyponatremia  Hypothyroidism TSH reasonably controlled -continue home Synthroid dose  DM 2 A1c 7.0 -continue to follow CBG -adjust insulin  HTN Not an active problem at this time -continue to follow blood pressure  DVT prophylaxis: Lovenox Code Status: FULL CODE Family Communication:  Disposition Plan: ICU  Consultants:  PCCM  Antimicrobials:  None  Objective: Blood pressure 139/64, pulse 67, temperature 98.8 F (37.1 C), temperature source Oral, resp. rate (!) 27, height 5\' 5"  (1.651 m), weight (!) 141.1 kg, last menstrual period 10/28/2011, SpO2 96 %.  Intake/Output Summary (Last 24 hours) at 08/16/2019 0903 Last data filed at 08/16/2019 0600 Gross per 24 hour  Intake -  Output 200 ml  Net -200 ml   Filed Weights   08/15/19 0508 08/15/19 1058 08/16/19 0106  Weight: (!) 143 kg (!) 141.7 kg (!) 141.1 kg    Examination: General: No acute respiratory distress Lungs: Diffuse fine crackles with no wheezing Cardiovascular: Regular rate and rhythm without murmur gallop or rub normal S1 and S2 Abdomen: Nontender, nondistended, soft, bowel sounds positive, no rebound, no ascites, no appreciable mass Extremities: No significant cyanosis, clubbing, or edema bilateral lower extremities  CBC: Recent Labs  Lab 08/01/2019 1140 08/15/19 0342 08/16/19 0745  WBC 10.8* 9.2  --   NEUTROABS 8.8*  --   --   HGB 12.9 13.3 14.3  HCT 41.0 43.1 42.0  MCV 90.5 92.5  --  PLT 144* 149*  --    Basic Metabolic Panel: Recent Labs  Lab 08/03/2019 1140 08/15/19 0342 08/16/19 0745  NA 135 134* 135  K 3.4* 3.7 3.7  CL 104 101  --   CO2 21* 22  --   GLUCOSE 114* 116*  --   BUN 15 19  --   CREATININE 0.74 1.06*  --   CALCIUM 8.5* 8.3*  --    GFR: Estimated Creatinine  Clearance: 83.8 mL/min (A) (by C-G formula based on SCr of 1.06 mg/dL (H)).  Liver Function Tests: Recent Labs  Lab 07/27/2019 1140 08/15/19 0342  AST 13* 16  ALT 15 15  ALKPHOS 64 56  BILITOT 1.4* 1.3*  PROT 7.5 7.8  ALBUMIN 3.4* 3.2*    HbA1C: Hemoglobin A1C  Date/Time Value Ref Range Status  01/25/2019 08:15 AM 6.6 (A) 4.0 - 5.6 % Final  07/28/2018 08:15 AM 6.6 (A) 4.0 - 5.6 % Final   Hgb A1c MFr Bld  Date/Time Value Ref Range Status  07/30/2019 05:02 PM 7.0 (H) 4.8 - 5.6 % Final    Comment:    (NOTE) Pre diabetes:          5.7%-6.4% Diabetes:              >6.4% Glycemic control for   <7.0% adults with diabetes     CBG: Recent Labs  Lab 08/15/19 1817 08/16/19 0600 08/16/19 0831  GLUCAP 192* 186* 180*    Recent Results (from the past 240 hour(s))  SARS CORONAVIRUS 2 (TAT 6-24 HRS) Nasopharyngeal Nasopharyngeal Swab     Status: Abnormal   Collection Time: 08/04/2019  1:06 PM   Specimen: Nasopharyngeal Swab  Result Value Ref Range Status   SARS Coronavirus 2 POSITIVE (A) NEGATIVE Final    Comment: RESULT CALLED TO, READ BACK BY AND VERIFIED WITH: Reita Chard, RN AT [phone] ON 08/15/2019 BY SAINVILUS S (NOTE) SARS-CoV-2 target nucleic acids are DETECTED. The SARS-CoV-2 RNA is generally detectable in upper and lower respiratory specimens during the acute phase of infection. Positive results are indicative of active infection with SARS-CoV-2. Clinical  correlation with patient history and other diagnostic information is necessary to determine patient infection status. Positive results do  not rule out bacterial infection or co-infection with other viruses. The expected result is Negative. Fact Sheet for Patients: SugarRoll.be Fact Sheet for Healthcare Providers: https://www.woods-mathews.com/ This test is not yet approved or cleared by the [place] FDA and  has been authorized for detection and/or diagnosis of  SARS-CoV-2 by FDA under an Emergency Use Authorization (EUA). This EUA will remain  in effect (meaning this test  can be used) for the duration of the COVID-19 declaration under Section 564(b)(1) of the Act, 21 U.S.C. section 360bbb-3(b)(1), unless the authorization is terminated or revoked sooner. Performed at [facility], [place] [address], [place], [place] [address]   MRSA PCR Screening     Status: None   Collection Time: 08/15/19 10:59 AM   Specimen: Nasal Mucosa; Nasopharyngeal  Result Value Ref Range Status   MRSA by PCR NEGATIVE NEGATIVE Final    Comment:        The GeneXpert MRSA Assay (FDA approved for NASAL specimens only), is one component of a comprehensive MRSA colonization surveillance program. It is not intended to diagnose MRSA infection nor to guide or monitor treatment for MRSA infections. Performed at [facility], [place] [address] [place], [place] [address]      Scheduled Meds: .  aspirin EC  81 mg Oral Daily  . atorvastatin  80 mg Oral Daily  . carvedilol  12.5 mg Oral BID  . Chlorhexidine Gluconate Cloth  6 each Topical Daily  . dexamethasone (DECADRON) injection  6 mg Intravenous Q24H  . enoxaparin (LOVENOX) injection  60 mg Subcutaneous Q24H  . insulin aspart  0-9 Units Subcutaneous TID WC  . levothyroxine  125 mcg Oral QAC breakfast  . mouth rinse  15 mL Mouth Rinse BID  . sacubitril-valsartan  1 tablet Oral BID  . spironolactone  25 mg Oral Daily   Continuous Infusions: . remdesivir 100 mg in NS 250 mL 100 mg (08/16/19 0845)     LOS: 2 days   Cherene Altes, MD [facility] Office  [phone] Pager - Text Page per Shea Evans  If 7PM-7AM, please contact night-coverage per Amion 08/16/2019, 9:03 AM

## 2019-08-16 NOTE — Progress Notes (Signed)
Call to CCMD to review patient monitor strips during episodes.

## 2019-08-16 NOTE — Progress Notes (Signed)
23- Patient's purewick leaked and patient stated she wanted to get up to the chair for RN to change bed linen. This RN assisted patient to the chair. Patient tolerated getting to chair. After being in chair for a few minutes patient began heavily belching and moaning. Patient was not responding, and diaphoretic, patient then started responding again, patient then repeated this 4 times, where she would stop responding for a about 20 seconds and then start responding again. Entire episode lasted aprox. 5 minutes. Full neuro assessment completed and WNL. BG 187. Call to Dr.David made aware of above. Patient then transferred back to bed with assist of 3 RN, and tolerated well, with no episodes.

## 2019-08-17 DIAGNOSIS — J9601 Acute respiratory failure with hypoxia: Secondary | ICD-10-CM | POA: Diagnosis not present

## 2019-08-17 DIAGNOSIS — J80 Acute respiratory distress syndrome: Secondary | ICD-10-CM | POA: Diagnosis not present

## 2019-08-17 DIAGNOSIS — R579 Shock, unspecified: Secondary | ICD-10-CM

## 2019-08-17 DIAGNOSIS — U071 COVID-19: Secondary | ICD-10-CM | POA: Diagnosis not present

## 2019-08-17 DIAGNOSIS — J1289 Other viral pneumonia: Secondary | ICD-10-CM

## 2019-08-17 DIAGNOSIS — R570 Cardiogenic shock: Secondary | ICD-10-CM

## 2019-08-17 DIAGNOSIS — R001 Bradycardia, unspecified: Secondary | ICD-10-CM | POA: Diagnosis not present

## 2019-08-17 DIAGNOSIS — I5041 Acute combined systolic (congestive) and diastolic (congestive) heart failure: Secondary | ICD-10-CM | POA: Diagnosis not present

## 2019-08-17 DIAGNOSIS — I5021 Acute systolic (congestive) heart failure: Secondary | ICD-10-CM

## 2019-08-17 LAB — CBC
HCT: 41.4 % (ref 36.0–46.0)
Hemoglobin: 13.2 g/dL (ref 12.0–15.0)
MCH: 28.4 pg (ref 26.0–34.0)
MCHC: 31.9 g/dL (ref 30.0–36.0)
MCV: 89.2 fL (ref 80.0–100.0)
Platelets: 164 10*3/uL (ref 150–400)
RBC: 4.64 MIL/uL (ref 3.87–5.11)
RDW: 14.9 % (ref 11.5–15.5)
WBC: 15.3 10*3/uL — ABNORMAL HIGH (ref 4.0–10.5)
nRBC: 0.1 % (ref 0.0–0.2)

## 2019-08-17 LAB — GLUCOSE, CAPILLARY
Glucose-Capillary: 123 mg/dL — ABNORMAL HIGH (ref 70–99)
Glucose-Capillary: 194 mg/dL — ABNORMAL HIGH (ref 70–99)
Glucose-Capillary: 163 mg/dL — ABNORMAL HIGH (ref 70–99)
Glucose-Capillary: 178 mg/dL — ABNORMAL HIGH (ref 70–99)

## 2019-08-17 LAB — COMPREHENSIVE METABOLIC PANEL
ALT: 19 U/L (ref 0–44)
AST: 38 U/L (ref 15–41)
Albumin: 3.2 g/dL — ABNORMAL LOW (ref 3.5–5.0)
Alkaline Phosphatase: 58 U/L (ref 38–126)
Anion gap: 14 (ref 5–15)
BUN: 70 mg/dL — ABNORMAL HIGH (ref 6–20)
CO2: 21 mmol/L — ABNORMAL LOW (ref 22–32)
Calcium: 8.4 mg/dL — ABNORMAL LOW (ref 8.9–10.3)
Chloride: 104 mmol/L (ref 98–111)
Creatinine, Ser: 1.88 mg/dL — ABNORMAL HIGH (ref 0.44–1.00)
GFR calc Af Amer: 34 mL/min — ABNORMAL LOW (ref >60)
GFR calc non Af Amer: 29 mL/min — ABNORMAL LOW (ref >60)
Glucose, Bld: 157 mg/dL — ABNORMAL HIGH (ref 70–99)
Potassium: 4.9 mmol/L (ref 3.5–5.1)
Sodium: 139 mmol/L (ref 135–145)
Total Bilirubin: 0.6 mg/dL (ref 0.3–1.2)
Total Protein: 7.8 g/dL (ref 6.5–8.1)

## 2019-08-17 LAB — POCT I-STAT 7, (LYTES, BLD GAS, ICA,H+H)
Acid-base deficit: 6 mmol/L — ABNORMAL HIGH (ref 0.0–2.0)
Acid-base deficit: 6 mmol/L — ABNORMAL HIGH (ref 0.0–2.0)
Bicarbonate: 18.8 mmol/L — ABNORMAL LOW (ref 20.0–28.0)
Bicarbonate: 18.8 mmol/L — ABNORMAL LOW (ref 20.0–28.0)
Calcium, Ion: 1.13 mmol/L — ABNORMAL LOW (ref 1.15–1.40)
Calcium, Ion: 1.13 mmol/L — ABNORMAL LOW (ref 1.15–1.40)
HCT: 40 % (ref 36.0–46.0)
HCT: 41 % (ref 36.0–46.0)
Hemoglobin: 13.6 g/dL (ref 12.0–15.0)
Hemoglobin: 13.9 g/dL (ref 12.0–15.0)
O2 Saturation: 79 %
O2 Saturation: 87 %
Patient temperature: 97.7
Patient temperature: 97.7
Potassium: 4.6 mmol/L (ref 3.5–5.1)
Potassium: 4.7 mmol/L (ref 3.5–5.1)
Sodium: 137 mmol/L (ref 135–145)
Sodium: 137 mmol/L (ref 135–145)
TCO2: 20 mmol/L — ABNORMAL LOW (ref 22–32)
TCO2: 20 mmol/L — ABNORMAL LOW (ref 22–32)
pCO2 arterial: 32.2 mmHg (ref 32.0–48.0)
pCO2 arterial: 33.3 mmHg (ref 32.0–48.0)
pH, Arterial: 7.37 (ref 7.350–7.450)
pH, Arterial: 7.357 (ref 7.350–7.450)
pO2, Arterial: 43 mmHg — ABNORMAL LOW (ref 83.0–108.0)
pO2, Arterial: 53 mmHg — ABNORMAL LOW (ref 83.0–108.0)

## 2019-08-17 LAB — MAGNESIUM: Magnesium: 2.5 mg/dL — ABNORMAL HIGH (ref 1.7–2.4)

## 2019-08-17 LAB — ABO/RH: ABO/RH(D): AB POS

## 2019-08-17 LAB — PROTIME-INR
INR: 1.1 (ref 0.8–1.2)
Prothrombin Time: 14.4 seconds (ref 11.4–15.2)

## 2019-08-17 LAB — SEDIMENTATION RATE: Sed Rate: 59 mm/hr — ABNORMAL HIGH (ref 0–22)

## 2019-08-17 LAB — PHOSPHORUS: Phosphorus: 4.1 mg/dL (ref 2.5–4.6)

## 2019-08-17 MED ORDER — ETOMIDATE 2 MG/ML IV SOLN
INTRAVENOUS | Status: AC
  Filled 2019-08-17: qty 20

## 2019-08-17 MED ORDER — SODIUM CHLORIDE 0.9% IV SOLUTION
Freq: Once | INTRAVENOUS | Status: AC
  Administered 2019-08-17: 11:00:00 via INTRAVENOUS

## 2019-08-17 MED ORDER — MIDAZOLAM HCL 2 MG/2ML IJ SOLN
INTRAMUSCULAR | Status: AC
  Filled 2019-08-17: qty 4

## 2019-08-17 MED ORDER — METOLAZONE 10 MG PO TABS
10.0000 mg | ORAL_TABLET | Freq: Once | ORAL | Status: AC
  Administered 2019-08-17: 13:00:00 10 mg via ORAL
  Filled 2019-08-17: qty 1

## 2019-08-17 MED ORDER — FUROSEMIDE 10 MG/ML IJ SOLN
40.0000 mg | Freq: Four times a day (QID) | INTRAMUSCULAR | Status: AC
  Administered 2019-08-17 (×3): 40 mg via INTRAVENOUS
  Filled 2019-08-17 (×3): qty 4

## 2019-08-17 MED ORDER — ROCURONIUM BROMIDE 10 MG/ML (PF) SYRINGE
PREFILLED_SYRINGE | INTRAVENOUS | Status: AC
  Filled 2019-08-17: qty 10

## 2019-08-17 MED ORDER — FENTANYL CITRATE (PF) 100 MCG/2ML IJ SOLN
INTRAMUSCULAR | Status: AC
  Filled 2019-08-17: qty 2

## 2019-08-17 MED ORDER — DOPAMINE-DEXTROSE 3.2-5 MG/ML-% IV SOLN
0.0000 ug/kg/min | INTRAVENOUS | Status: DC
  Administered 2019-08-18: 5 ug/kg/min via INTRAVENOUS
  Filled 2019-08-17: qty 250

## 2019-08-17 MED ORDER — ENOXAPARIN SODIUM 150 MG/ML ~~LOC~~ SOLN
1.0000 mg/kg | Freq: Two times a day (BID) | SUBCUTANEOUS | Status: DC
  Administered 2019-08-17 – 2019-08-19 (×5): 140 mg via SUBCUTANEOUS
  Filled 2019-08-17 (×6): qty 0.93

## 2019-08-17 MED FILL — Medication: Qty: 1 | Status: AC

## 2019-08-17 NOTE — Progress Notes (Signed)
Called back, patient is improving on BiPAP from a respiratory standpoint but is now dropping her pressure.  Will place orders for peripheral dopamine for HR and BP control.  If ineffective will likely need a central line and more aggressive therapy to require intubation.  Will avoid intubation for now.  The patient is critically ill with multiple organ systems failure and requires high complexity decision making for assessment and support, frequent evaluation and titration of therapies, application of advanced monitoring technologies and extensive interpretation of multiple databases.   Critical Care Time devoted to patient care services described in this note is  20  Minutes. This time reflects time of care of this signee Dr Jennet Maduro. This critical care time does not reflect procedure time, or teaching time or supervisory time of PA/NP/Med student/Med Resident etc but could involve care discussion time.  Rush Farmer, M.D. [facility] Pulmonary/Critical Care Medicine.

## 2019-08-17 NOTE — Progress Notes (Signed)
Oxygen sats decreased to 78% and sustaining on 50L 100% HFNC. Put NRB on in addition to HFNC and sats up to 86%. Continuing to monitor.

## 2019-08-17 NOTE — Progress Notes (Signed)
Called by bedside RN.  Patient had another episode of bradycardia and hypotension with desaturation.  I went an evaluated the patient, she is now awake and interactive and following commands.  Remains breathing in the 40's with severely diminished BS diffusely.  Now saturating 88-90% on very high flow O2.  ABG obtained with no CO2 retention but severe hypoxemia.  Given the fact that the patient is extremely large and intubation will not only be risky but the chances of liberating her from the ventilator due to her size will be extremely low I have elected to attempt BiPAP with a high exp PAP.  If that is unsuccessful then will intubate and take the risk.  This was communicated with the patient's son.  Will allow sometime for BiPAP then re-evaluate.  The patient is critically ill with multiple organ systems failure and requires high complexity decision making for assessment and support, frequent evaluation and titration of therapies, application of advanced monitoring technologies and extensive interpretation of multiple databases.   Critical Care Time devoted to patient care services described in this note is  35  Minutes. This time reflects time of care of this signee Dr Jennet Maduro. This critical care time does not reflect procedure time, or teaching time or supervisory time of PA/NP/Med student/Med Resident etc but could involve care discussion time.  Rush Farmer, M.D. [facility] Pulmonary/Critical Care Medicine.

## 2019-08-17 NOTE — Progress Notes (Signed)
Order received for patient to be placed on BiPAP per ABG results.  Placed patient on settings PS 5, PEEP 15, for IPAP 20 EPAP 15 with FiO2 100%.  Patient tolerating well.  Will repeat ABG.

## 2019-08-17 NOTE — Research (Signed)
[place] PACT Informed Consent   Subject Name: Brittney Ross  Subject met inclusion and exclusion criteria.  The informed consent form, study requirements and expectations were reviewed with the subject and questions and concerns were addressed prior to the signing of the consent form.  The subject verbalized understanding of the trail requirements.  The subject agreed to participate in the [place] PACT trial and signed the informed consent.  The informed consent was obtained prior to performance of any protocol-specific procedures for the subject.  A copy of the signed informed consent was given to the subject and a copy was placed in the subject's medical record.  Philemon Kingdom D 08/17/2019, 1119 AM

## 2019-08-17 NOTE — Progress Notes (Addendum)
HR dropped to 46. bp 77/42 MAP 47. Notified RN charge and MD. RR 36. Labored and lethargic. RT notified. ABG done per order and Bipap placed on her fio2 100%. PEEP 15. BP now 111/57 map 68. HR 67, sats 94-95%.

## 2019-08-17 NOTE — Progress Notes (Signed)
ANTICOAGULATION CONSULT NOTE - Initial Consult  Pharmacy Consult for Lovenox Indication: VTE prophylaxis (COVID-PACT trial - treatment dose arm)   No Known Allergies  Patient Measurements: Height: _0  (165.1 cm) Weight: (!) 311 lb 1.1 oz (141.1 kg) IBW/kg (Calculated) : 57  Vital Signs: Temp: 98 F (36.7 C) (10/28 1200) Temp Source: Axillary (10/28 1200) BP: 132/56 (10/28 1200) Pulse Rate: 67 (10/28 1330)  Labs: Recent Labs    08/12/2019 1417 08/19/2019 1702 08/15/19 0342  08/16/19 1158 08/16/19 2249 08/17/19 0415  HGB  --   --  13.3   < > 13.3 13.9 13.2  HCT  --   --  43.1   < > 41.6 41.0 41.4  PLT  --   --  149*  --  154  --  164  CREATININE  --   --  1.06*  --  1.25*  --  1.88*  TROPONINIHS 12 13  --   --   --   --   --    < > = values in this interval not displayed.    Estimated Creatinine Clearance: 47.2 mL/min (A) (by C-G formula based on SCr of 1.88 mg/dL (H)).   Medical History: Past Medical History:  Diagnosis Date  . Anemia   . Chronic combined systolic and diastolic heart failure (HCC) 07/05/2017   Non-ischemic CM // Echo 9/18:EF 20-25, severe diff HK, Gr 1 DD, mild MR, mod LAE // R/L Heart Cath 9/18: pD1 40, mD1 30; o/w normal cors // Echo 5/19: EF 35-40, trivial AI, mildly dilated aortic root (38 mm), MAC, mild LAE   . Hyperlipidemia   . Hypertension    Pt states she "doesn't have HTN"  . Morbid obesity ([place])   . Thyroid disease hyperthyroidism    Medications:  Medications Prior to Admission  Medication Sig Dispense Refill Last Dose  . albuterol (PROVENTIL HFA;VENTOLIN HFA) 108 (90 Base) MCG/ACT inhaler Inhale 2 puffs into the lungs every 6 (six) hours as needed for wheezing or shortness of breath. 8 g 12 08/12/2019  . aspirin EC 81 MG tablet Take 81 mg by mouth daily.   08/01/2019 at Unknown time  . atorvastatin (LIPITOR) 80 MG tablet Take 1 tablet (80 mg total) by mouth daily. 90 tablet 1 07/21/2019 at Unknown time  . carvedilol (COREG) 12.5 MG  tablet Take 1 tablet (12.5 mg total) by mouth 2 (two) times daily. 180 tablet 3 07/25/2019 at 0800  . furosemide (LASIX) 20 MG tablet Take 1 tablet (20 mg total) by mouth daily as needed for fluid or edema. 90 tablet 3 08/08/2019 at Unknown time  . levothyroxine (SYNTHROID, LEVOTHROID) 125 MCG tablet Take 1 tablet (125 mcg total) by mouth daily. 90 tablet 3 08/13/2019 at Unknown time  . sacubitril-valsartan (ENTRESTO) 97-103 MG Take 1 tablet by mouth 2 (two) times daily. 60 tablet 11 08/02/2019 at Unknown time  . spironolactone (ALDACTONE) 25 MG tablet Take 1 tablet (25 mg total) by mouth daily. 90 tablet 3 08/11/2019 at Unknown time    Assessment: 45 YOF with COVID-19 associated hypoxia enrolled in treatment arm of COVID-PACT trial. Pharmacy consulted to start treatment dose Lovenox. H/H and Plt wnl. SCr up to 1.88 today. CrCl remains greater than 30 ml/min.   Currently on Lovenox prophylactic dose. Last dose was given at 11:11 AM today  Goal of Therapy:  Anti-Xa level 0.6-1 units/ml 4hrs after LMWH dose given Monitor platelets by anticoagulation protocol: Yes   Plan:  -Start Lovenox 1 mg/kg (  140 mg) twice daily. First dose tonight at 2300.  -Monitor CBC, renal fx and s/s of bleeding   Albertina Parr, PharmD., BCPS Clinical Pharmacist Clinical phone for 08/17/19 until 5pm: [phone]

## 2019-08-17 NOTE — Progress Notes (Signed)
30L HFNC 100% and NRB  in use sats 90-95%  Removed NRB to assess how well she does on HFNC alone while eating. sats stay at 80-83% Assisted to side lying position after eating breakfast b/c she states she can not sleep on her stomach in a prone position when educated on that.  After assisting to left side lying position her sats are at 78% on HFNC 30 L 100% so Placed NRB back on her along with the HFNC.  Continuing to monitor. Sats now 87%.

## 2019-08-17 NOTE — Progress Notes (Signed)
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  She did not have fevers or sick contacts he did have one episode of loose stools.  She lives at home with her husband. Admission chest x-ray showed cardiomegaly and bilateral interstitial infiltrates.  Oxygen saturation was 88% on room air which improved on 3 L.  She was afebrile.  Overnight oxygen requirements have increased to 8 L oxygen this morning and she was tachypneic hence transferred to the ICU and PCCM is consulted.  Admission labs significant for BNP of 3149 lymphopenia She was given Lasix and diuresed 200 cc  SIGNIFICANT PAST MEDICAL HISTORY   Chronic systolic heart failure EF 30 to 35% Hypertension, hypothyroidism, hyperlipidemia, obesity  SIGNIFICANT EVENTS:  10/26 transferred to ICU  STUDIES:   Echo 02/2018 EF 35 to 40%  CULTURES:  SARS COV-2 10/25 >> POS  ANTIBIOTICS:    LINES/TUBES:    CONSULTANTS:   SUBJECTIVE:  Rapid response called overnight due to a severe hypoxemic event Now on 100% and 30L feeling better  CONSTITUTIONAL: BP (!) 101/43 (BP Location: Right Leg)   Pulse 60   Temp 97.6 F (36.4 C) (Oral)   Resp (!) 38   Ht 5\' 5"  (1.651 m)   Wt (!) 141.1 kg   LMP 10/28/2011   SpO2 (!) 87%   BMI 51.76 kg/m   I/O last 3 completed shifts: In: 61  [P.O.:480; IV Piggyback:245] Out: 1000 [Urine:1000]  FiO2 (%):  [100 %] 100 %  PHYSICAL EXAM: General: Chronically ill appearing female, morbidly obese, in moderate respiratory distress HEENT: [place]/AT, PERRL, EOM-I and MMM Heart: RRR, Nl S1/S2 and -M/R/G Lung: Very distant and diminished bilaterally Abdomen: Very obese, soft, NT, ND and +BS Ext: -edema and -tenderness Neuro: Alert and interactive, moving all ext to command  I reviewed CXR myself, minimal lung space between infiltrate and obesity  Discussed with TRH-MD  RESOLVED PROBLEM LIST   ASSESSMENT AND PLAN    Acute hypoxic respiratory failure-use heated high flow nasal cannula and this seems to have decreased her work of breathing - Continue HFNC for now - Lasix and zaroxolyn ordered again - Monitor I/O - Continue decadron - Continue remdesivir  - Lovenox COVID dose - Titrate O2 for sat of 85 or higher - No intubation for now as I am concerned intubation in her case due to her body habitus will mean tracheostomy - Monitor in the ICU given high chances of deterioration - Will attempt BiPAP to tonight given overnight events  COVID-19 positive test (U07.1, COVID-19) with Acute Pneumonia (J12.89, Other viral pneumonia) - Decadron and remdesivir - Inflammatory markers for triad - Anticoagulation to COVID dose  Chronic systolic heart failure -Lasix 40 every 12 today and can transition to once daily. Continue Coreg and Entresto and Aldactone  PCCM will continue to follow  SUMMARY OF TODAY'S PLAN:  She has stabilized after transfer to ICU, can transfer to [facility] once bed available  Best Practice / Goals of Care / Disposition.   DVT PROPHYLAXIS: Lovenox SUP: Pepcid NUTRITION: Diet MOBILITY: Out of bed to chair GOALS OF CARE:N/A FAMILY DISCUSSIONS: Patient informed DISPOSITION ICU  LABS  Glucose Recent Labs  Lab 08/16/19 0600 08/16/19 0831 08/16/19 1223 08/16/19 1650 08/16/19 2118 08/17/19 0803   GLUCAP 186* 180* 162* 200* 184* 123*    BMET Recent Labs  Lab 08/15/19 0342  08/16/19 1158 08/16/19 2249 08/17/19 0415  NA 134*   < > 136 136 139  K 3.7   < > 4.0 4.4 4.9  CL 101  --  103  --  104  CO2 22  --  22  --  21*  BUN 19  --  49*  --  70*  CREATININE 1.06*  --  1.25*  --  1.88*  GLUCOSE 116*  --  177*  --  157*   < > = values in this interval not displayed.    Liver Enzymes Recent Labs  Lab 08/15/19 0342 08/16/19 1158 08/17/19 0415  AST 16 31 38  ALT 15 19 19   ALKPHOS 56 54 58  BILITOT 1.3* 0.9 0.6  ALBUMIN 3.2* 3.1* 3.2*    Electrolytes Recent Labs  Lab 08/15/19 0342 08/16/19 1158 08/17/19 0415  CALCIUM 8.3* 8.2* 8.4*  MG  --  2.5* 2.5*  PHOS  --  3.5 4.1    CBC Recent Labs  Lab 08/15/19 0342  08/16/19 1158 08/16/19 2249 08/17/19 0415  WBC 9.2  --  12.8*  --  15.3*  HGB 13.3   < > 13.3 13.9 13.2  HCT 43.1   < > 41.6 41.0 41.4  PLT 149*  --  154  --  164   < > = values in this interval not displayed.    ABG Recent Labs  Lab 08/16/19 2249  PHART 7.350  PCO2ART 31.9*  PO2ART 36.0*    Coag's No results for input(s): APTT, INR in the last 168 hours.  Sepsis Markers Recent Labs  Lab 07/30/2019 1033 08/19/2019 1140 07/24/2019 1702  LATICACIDVEN  --  0.8 1.1  PROCALCITON <0.10  --   --    Cardiac Enzymes No results for input(s): TROPONINI, PROBNP in the last 168 hours.  The patient is critically ill with multiple organ systems failure and requires high complexity decision making for assessment and support, frequent evaluation and titration of therapies, application of advanced monitoring technologies and extensive interpretation of multiple databases.   Critical Care Time devoted to patient care services described in this note is  33  Minutes. This time reflects time of care of this signee Dr Jennet Maduro. This critical care time does not reflect procedure time, or teaching time or supervisory time of PA/NP/Med student/Med Resident etc  but could involve care discussion time.  Rush Farmer, M.D. [facility] Pulmonary/Critical Care Medicine.

## 2019-08-17 NOTE — Progress Notes (Signed)
PROGRESS NOTE  Brittney Ross [id number] DOB: Sep 25, 1961 DOA: 08/01/2019  PCP: Azzie Glatter, FNP  Brief History/Interval Summary: 58 year old with a history of systolic congestive heart failure (30-35% EF), HTN, HLD, obesity, and hypothyroidism who presented with complaints of severe shortness of breath with a cough which had been worsening over approximately 2 days.  Upon arrival in the ED she was found to be hypoxic at 88% on room air.  A chest x-ray suggested only pulmonary vascular congestion.  Shortly after her admission to [facility] she developed significant increased work of breathing with respiratory rates in the 40-50s.  Her Covid test subsequently returned positive and treatment was initiated with Decadron, remdesivir, and Actemra.   Reason for Visit: Acute respiratory failure with hypoxia.  Pneumonia due to COVID-19.  Consultants: Pulmonology  Procedures:   Transthoracic echocardiogram 1. Left ventricular ejection fraction, by visual estimation, is 55 to 60%. The left ventricle has normal function. Normal left ventricular size. There is mildly increased left ventricular hypertrophy.  2. Left ventricular diastolic Doppler parameters are consistent with impaired relaxation pattern of LV diastolic filling.  3. Apical window is foreshortened.  4. Global right ventricle has normal systolic function.The right ventricular size is normal. No increase in right ventricular wall thickness.  5. Left atrial size was normal.  6. Right atrial size was normal.  7. Mild to moderate mitral annular calcification.  8. The mitral valve is grossly normal. Trace mitral valve regurgitation.  9. The tricuspid valve is normal in structure. Tricuspid valve regurgitation is trivial. 10. The aortic valve is tricuspid Aortic valve regurgitation was not visualized by color flow Doppler. Mild to moderate aortic valve sclerosis/calcification without any evidence of aortic stenosis. 11. The pulmonic  valve was not well visualized. Pulmonic valve regurgitation is not visualized by color flow Doppler. 12. The left ventricular function has improved.   Antibiotics: Anti-infectives (From admission, onward)   Start     Dose/Rate Route Frequency Ordered Stop   08/16/19 1000  remdesivir 100 mg in sodium chloride 0.9 % 250 mL IVPB     100 mg 500 mL/hr over 30 Minutes Intravenous Every 24 hours 08/15/19 1004 08/20/19 0959   08/15/19 1100  remdesivir 200 mg in sodium chloride 0.9 % 250 mL IVPB     200 mg 500 mL/hr over 30 Minutes Intravenous Once 08/15/19 1004 08/15/19 1200      Subjective/Interval History: Patient states that she does have difficulty breathing.  Occasional dry cough.  No nausea vomiting.    Assessment/Plan:  Acute Hypoxic Resp. Failure/Pneumonia due to COVID-19  FiO2 (%):  [100 %] 100 %     Component Value Date/Time   PHART 7.350 08/16/2019 2249   PCO2ART 31.9 (L) 08/16/2019 2249   PO2ART 36.0 (LL) 08/16/2019 2249   HCO3 17.7 (L) 08/16/2019 2249   TCO2 19 (L) 08/16/2019 2249   ACIDBASEDEF 7.0 (H) 08/16/2019 2249   O2SAT 68.0 08/16/2019 2249    COVID-19 Labs  Recent Labs    08/02/2019 1416 08/11/2019 1417 08/16/19 1158 08/16/19 1230  DDIMER 1.00*  --  0.94*  --   FERRITIN  --  455*  --  1,159*  LDH 200*  --  412*  --   CRP  --  11.1*  --  21.5*    Lab Results  Component Value Date   SARSCOV2NAA POSITIVE (A) 08/20/2019   COVID-19 specific Treatment: Decadron 10/26 > Remdesivir 10/26 > Actemra 10/26  Fever: Afebrile Oxygen requirements: Heated HFNC.  15  L/min.  100% FiO2.  Saturating in the late 80s to early 90s Antibacterials: None Remdesivir: Day 3 Steroids: Dexamethasone 6 mg daily Diuretics: Lasix being given daily based on volume status. Actemra: 1 dose given on 10/26 Convalescent plasma: Consented today.  Ordered today. DVT Prophylaxis: On Lovenox as part of the Covid pact trial   Research Studies: COVID PACT Trial  Patient is  respiratory status is very tenuous.  She is on high doses of oxygen saturating in the late 80s to early 90s.  Tends to desaturate even with minimal exertion.  She does have slightly high work of breathing.  She is on remdesivir and steroids.  Her inflammatory markers were significantly elevated.  She received Actemra on 10/26.  Long conversation with patient regarding convalescent plasma.  Research trial documents were given to the patient.  She has read through the consent paperwork.  She is agreeable.  She signed a consent form.  I have signed the consent form.  The hardcopy is in the shadow chart.  Convalescent plasma has been ordered.  Continue to trend inflammatory markers.  Prone positioning as much as possible although to be a challenge due to her obesity.  Incentive spirometry and mobilization.  Acute on chronic systolic CHF Based on echocardiogram from 2019 her EF was 35 to 40%.  Echocardiogram done during this hospitalization shows improvement in EF to 55%.  BNP was elevated at 314.  Patient getting Lasix.  However most of her presentation is due to COVID-19.  Continue carvedilol and Entresto.  Continue Aldactone.  Monitor renal function.  Acute kidney injury Baseline creatinine around 1.7.  Noted to be slightly elevated today.  Monitor urine output.  Monitor electrolytes.  Morbid obesity Estimated body mass index is 51.76 kg/m as calculated from the following:   Height as of this encounter: 5\' 5"  (1.651 m).   Weight as of this encounter: 141.1 kg.  Hyperlipidemia Continue statin.  Hypothyroidism Continue Synthroid.  Diabetes mellitus type 2, uncontrolled with hyperglycemia HbA1c 7.0.  Elevated CBG likely due to steroids.  Monitor CBGs.  SSI.  Continue Lantus insulin.  Essential hypertension Blood pressure is reasonably well controlled at this time.  She did have an episode of hypotension earlier today.  She also was noted to be bradycardic.  FEN Not on any IV fluids.   Monitor electrolytes.  Keep her on liquids. due to tenuous respiratory status in case she needs to be intubated.   DVT Prophylaxis:  COVID PACT Trial PUD Prophylaxis: Continue Protonix Code Status: Full code Family Communication: Discussed with her son Disposition Plan: Remain in ICU for now   Medications:  Scheduled: . aspirin EC  81 mg Oral Daily  . atorvastatin  80 mg Oral Daily  . carvedilol  6.25 mg Oral BID  . Chlorhexidine Gluconate Cloth  6 each Topical Daily  . dexamethasone (DECADRON) injection  6 mg Intravenous Q24H  . enoxaparin (LOVENOX) injection  1 mg/kg Subcutaneous Q12H  . etomidate      . fentaNYL      . furosemide  40 mg Intravenous Q6H  . insulin aspart  0-9 Units Subcutaneous TID WC  . insulin glargine  12 Units Subcutaneous QHS  . levothyroxine  125 mcg Oral QAC breakfast  . mouth rinse  15 mL Mouth Rinse BID  . midazolam      . pantoprazole (PROTONIX) IV  40 mg Intravenous Q24H  . rocuronium bromide      . sacubitril-valsartan  1 tablet Oral BID  .  spironolactone  25 mg Oral Daily   Continuous: . remdesivir 100 mg in NS 250 mL 100 mg (08/17/19 0839)   [id number], albuterol, lip balm, oxyCODONE, sodium chloride, sodium chloride flush, traMADol   Objective:  Vital Signs  Vitals:   08/17/19 1350 08/17/19 1355 08/17/19 1400 08/17/19 1405  BP: (!) 77/42  (!) 132/40 (!) 124/45  Pulse: (!) 56 (!) 46 62 64  Resp: 20 (!) 32 (!) 34 (!) 29  Temp:      TempSrc:      SpO2: (!) 87% (!) 86% (!) 89% (!) 89%  Weight:      Height:        Intake/Output Summary (Last 24 hours) at 08/17/2019 1415 Last data filed at 08/17/2019 1300 Gross per 24 hour  Intake 720 ml  Output 1000 ml  Net -280 ml   Filed Weights   08/15/19 0508 08/15/19 1058 08/16/19 0106  Weight: (!) 143 kg (!) 141.7 kg (!) 141.1 kg    General appearance: Awake alert.  In no distress.  Morbidly obese Resp: Noted to be tachypneic.  Some use of accessory muscles noted.  Crackles  bilaterally.  No wheezing or rhonchi. Cardio: S1-S2 is normal regular.  No S3-S4.  No rubs murmurs or bruit GI: Abdomen is soft.  Nontender nondistended.  Bowel sounds are present normal.  No masses organomegaly Extremities: No edema.  Full range of motion of lower extremities. Neurologic: Alert and oriented x3.  No focal neurological deficits.    Lab Results:  Data Reviewed: I have personally reviewed following labs and imaging studies  CBC: Recent Labs  Lab 07/30/2019 1140 08/15/19 0342 08/16/19 0745 08/16/19 1158 08/16/19 2249 08/17/19 0415  WBC 10.8* 9.2  --  12.8*  --  15.3*  NEUTROABS 8.8*  --   --  11.2*  --   --   HGB 12.9 13.3 14.3 13.3 13.9 13.2  HCT 41.0 43.1 42.0 41.6 41.0 41.4  MCV 90.5 92.5  --  90.0  --  89.2  PLT 144* 149*  --  154  --  [id number]    Basic Metabolic Panel: Recent Labs  Lab 08/13/2019 1140 08/15/19 0342 08/16/19 0745 08/16/19 1158 08/16/19 2249 08/17/19 0415  NA 135 134* 135 136 136 139  K 3.4* 3.7 3.7 4.0 4.4 4.9  CL 104 101  --  103  --  104  CO2 21* 22  --  22  --  21*  GLUCOSE 114* 116*  --  177*  --  157*  BUN 15 19  --  49*  --  70*  CREATININE 0.74 1.06*  --  1.25*  --  1.88*  CALCIUM 8.5* 8.3*  --  8.2*  --  8.4*  MG  --   --   --  2.5*  --  2.5*  PHOS  --   --   --  3.5  --  4.1    GFR: Estimated Creatinine Clearance: 47.2 mL/min (A) (by C-G formula based on SCr of 1.88 mg/dL (H)).  Liver Function Tests: Recent Labs  Lab 07/28/2019 1140 08/15/19 0342 08/16/19 1158 08/17/19 0415  AST 13* 16 31 38  ALT 15 15 19 19   ALKPHOS 64 56 54 58  BILITOT 1.4* 1.3* 0.9 0.6  PROT 7.5 7.8 7.7 7.8  ALBUMIN 3.4* 3.2* 3.1* 3.2*    HbA1C: Recent Labs    07/25/2019 1702  HGBA1C 7.0*    CBG: Recent Labs  Lab 08/16/19 1223 08/16/19 1650 08/16/19 2118  08/17/19 0803 08/17/19 1115  GLUCAP 162* 200* 184* 123* 194*    Lipid Profile: Recent Labs    08/15/19 0342  CHOL 187  HDL 55  LDLCALC 120*  TRIG 60  CHOLHDL 3.4     Thyroid Function Tests: Recent Labs    07/27/2019 1702  TSH 1.465    Anemia Panel: Recent Labs    08/09/2019 1417 08/16/19 1230  FERRITIN 455* 1,159*    Recent Results (from the past 240 hour(s))  SARS CORONAVIRUS 2 (TAT 6-24 HRS) Nasopharyngeal Nasopharyngeal Swab     Status: Abnormal   Collection Time: 07/23/2019  1:06 PM   Specimen: Nasopharyngeal Swab  Result Value Ref Range Status   SARS Coronavirus 2 POSITIVE (A) NEGATIVE Final    Comment: RESULT CALLED TO, READ BACK BY AND VERIFIED WITH: Reita Chard, RN AT [phone] ON 08/15/2019 BY SAINVILUS S (NOTE) SARS-CoV-2 target nucleic acids are DETECTED. The SARS-CoV-2 RNA is generally detectable in upper and lower respiratory specimens during the acute phase of infection. Positive results are indicative of active infection with SARS-CoV-2. Clinical  correlation with patient history and other diagnostic information is necessary to determine patient infection status. Positive results do  not rule out bacterial infection or co-infection with other viruses. The expected result is Negative. Fact Sheet for Patients: SugarRoll.be Fact Sheet for Healthcare Providers: https://www.woods-mathews.com/ This test is not yet approved or cleared by the [place] FDA and  has been authorized for detection and/or diagnosis of SARS-CoV-2 by FDA under an Emergency Use Authorization (EUA). This EUA will remain  in effect (meaning this test  can be used) for the duration of the COVID-19 declaration under Section 564(b)(1) of the Act, 21 U.S.C. section 360bbb-3(b)(1), unless the authorization is terminated or revoked sooner. Performed at [facility], [place] [address], [place], [place] [address]   MRSA PCR Screening     Status: None   Collection Time: 08/15/19 10:59 AM   Specimen: Nasal Mucosa; Nasopharyngeal  Result Value Ref Range Status   MRSA by PCR NEGATIVE NEGATIVE Final    Comment:         The GeneXpert MRSA Assay (FDA approved for NASAL specimens only), is one component of a comprehensive MRSA colonization surveillance program. It is not intended to diagnose MRSA infection nor to guide or monitor treatment for MRSA infections. Performed at [facility], [place] [address], [place], [place] [address]       Radiology Studies: Dg Chest Port 1 View  Result Date: 08/16/2019 CLINICAL DATA:  Shortness of breath. EXAM: PORTABLE CHEST 1 VIEW COMPARISON:  August 14, 2019. FINDINGS: Stable cardiomegaly. Exam is limited due to body habitus. No pneumothorax is noted. Significantly increased opacities are noted throughout both lungs concerning for worsening edema or pneumonia. Pleural effusion cannot be excluded. Visualized portion of bony thorax is unremarkable. IMPRESSION: Exam is limited due to body habitus. Significant increased bilateral lung opacities are noted concerning for worsening edema or pneumonia. Electronically Signed   By: Marijo Conception M.D.   On: 08/16/2019 07:36       LOS: 3 days   [facility] Pager on [email or web address]  08/17/2019, 2:15 PM

## 2019-08-17 NOTE — Progress Notes (Signed)
Spoke with son Rodrick. Updated on patients status. Reassured we reach out to family if she has to be intubated. He is grateful for the care we are providing.

## 2019-08-17 NOTE — Progress Notes (Signed)
Placed on HHF at this time to give pt a break from BiPAP. Pt toelrating well at this time. Will place pt back on BiPAP QHS

## 2019-08-18 ENCOUNTER — Inpatient Hospital Stay (HOSPITAL_COMMUNITY): Payer: BLUE CROSS/BLUE SHIELD

## 2019-08-18 DIAGNOSIS — R579 Shock, unspecified: Secondary | ICD-10-CM | POA: Diagnosis not present

## 2019-08-18 DIAGNOSIS — J9601 Acute respiratory failure with hypoxia: Secondary | ICD-10-CM | POA: Diagnosis not present

## 2019-08-18 DIAGNOSIS — R0902 Hypoxemia: Secondary | ICD-10-CM

## 2019-08-18 DIAGNOSIS — I5041 Acute combined systolic (congestive) and diastolic (congestive) heart failure: Secondary | ICD-10-CM | POA: Diagnosis not present

## 2019-08-18 LAB — D-DIMER, QUANTITATIVE: D-Dimer, Quant: 0.85 ug/mL-FEU — ABNORMAL HIGH (ref 0.00–0.50)

## 2019-08-18 LAB — POCT I-STAT 7, (LYTES, BLD GAS, ICA,H+H)
Acid-base deficit: 5 mmol/L — ABNORMAL HIGH (ref 0.0–2.0)
Bicarbonate: 19.8 mmol/L — ABNORMAL LOW (ref 20.0–28.0)
Calcium, Ion: 1.13 mmol/L — ABNORMAL LOW (ref 1.15–1.40)
HCT: 41 % (ref 36.0–46.0)
Hemoglobin: 13.9 g/dL (ref 12.0–15.0)
O2 Saturation: 89 %
Patient temperature: 98.2
Potassium: 4.9 mmol/L (ref 3.5–5.1)
Sodium: 138 mmol/L (ref 135–145)
TCO2: 21 mmol/L — ABNORMAL LOW (ref 22–32)
pCO2 arterial: 34.8 mmHg (ref 32.0–48.0)
pH, Arterial: 7.361 (ref 7.350–7.450)
pO2, Arterial: 57 mmHg — ABNORMAL LOW (ref 83.0–108.0)

## 2019-08-18 LAB — GLUCOSE, CAPILLARY
Glucose-Capillary: 172 mg/dL — ABNORMAL HIGH (ref 70–99)
Glucose-Capillary: 95 mg/dL (ref 70–99)
Glucose-Capillary: 144 mg/dL — ABNORMAL HIGH (ref 70–99)
Glucose-Capillary: 149 mg/dL — ABNORMAL HIGH (ref 70–99)
Glucose-Capillary: 209 mg/dL — ABNORMAL HIGH (ref 70–99)

## 2019-08-18 LAB — COMPREHENSIVE METABOLIC PANEL
ALT: 23 U/L (ref 0–44)
AST: 53 U/L — ABNORMAL HIGH (ref 15–41)
Albumin: 3.2 g/dL — ABNORMAL LOW (ref 3.5–5.0)
Alkaline Phosphatase: 61 U/L (ref 38–126)
Anion gap: 11 (ref 5–15)
BUN: 91 mg/dL — ABNORMAL HIGH (ref 6–20)
CO2: 21 mmol/L — ABNORMAL LOW (ref 22–32)
Calcium: 8.3 mg/dL — ABNORMAL LOW (ref 8.9–10.3)
Chloride: 105 mmol/L (ref 98–111)
Creatinine, Ser: 2.42 mg/dL — ABNORMAL HIGH (ref 0.44–1.00)
GFR calc Af Amer: 25 mL/min — ABNORMAL LOW (ref >60)
GFR calc non Af Amer: 21 mL/min — ABNORMAL LOW (ref >60)
Glucose, Bld: 107 mg/dL — ABNORMAL HIGH (ref 70–99)
Potassium: 5 mmol/L (ref 3.5–5.1)
Sodium: 137 mmol/L (ref 135–145)
Total Bilirubin: 1 mg/dL (ref 0.3–1.2)
Total Protein: 7.8 g/dL (ref 6.5–8.1)

## 2019-08-18 LAB — PREPARE FRESH FROZEN PLASMA

## 2019-08-18 LAB — CBC
HCT: 43 % (ref 36.0–46.0)
Hemoglobin: 13.8 g/dL (ref 12.0–15.0)
MCH: 28.7 pg (ref 26.0–34.0)
MCHC: 32.1 g/dL (ref 30.0–36.0)
MCV: 89.4 fL (ref 80.0–100.0)
Platelets: 176 10*3/uL (ref 150–400)
RBC: 4.81 MIL/uL (ref 3.87–5.11)
RDW: 14.9 % (ref 11.5–15.5)
WBC: 16 10*3/uL — ABNORMAL HIGH (ref 4.0–10.5)
nRBC: 0.2 % (ref 0.0–0.2)

## 2019-08-18 LAB — BPAM FFP
Blood Product Expiration Date: 202010291511
ISSUE DATE / TIME: 202010281516
Unit Type and Rh: 8400

## 2019-08-18 LAB — C-REACTIVE PROTEIN: CRP: 8.3 mg/dL — ABNORMAL HIGH (ref <1.0)

## 2019-08-18 LAB — FIBRINOGEN: Fibrinogen: 656 mg/dL — ABNORMAL HIGH (ref 210–475)

## 2019-08-18 LAB — PROTIME-INR
INR: 1.1 (ref 0.8–1.2)
Prothrombin Time: 14.3 seconds (ref 11.4–15.2)

## 2019-08-18 LAB — MAGNESIUM: Magnesium: 2.6 mg/dL — ABNORMAL HIGH (ref 1.7–2.4)

## 2019-08-18 LAB — PHOSPHORUS: Phosphorus: 4.8 mg/dL — ABNORMAL HIGH (ref 2.5–4.6)

## 2019-08-18 MED ORDER — PANTOPRAZOLE SODIUM 40 MG PO TBEC: 40.0000 mg | DELAYED_RELEASE_TABLET | Freq: Every day | ORAL | Status: DC

## 2019-08-18 MED ORDER — LORAZEPAM 2 MG/ML IJ SOLN
INTRAMUSCULAR | Status: AC
  Administered 2019-08-18: 0.5 mg via INTRAVENOUS
  Filled 2019-08-18: qty 1

## 2019-08-18 MED ORDER — DOPAMINE-DEXTROSE 3.2-5 MG/ML-% IV SOLN
0.0000 ug/kg/min | INTRAVENOUS | Status: DC
  Administered 2019-08-18 – 2019-08-19 (×2): 5 ug/kg/min via INTRAVENOUS
  Administered 2019-08-20: 4 ug/kg/min via INTRAVENOUS
  Administered 2019-08-23: 5 ug/kg/min via INTRAVENOUS
  Filled 2019-08-18 (×4): qty 250

## 2019-08-18 MED ORDER — LORAZEPAM 2 MG/ML IJ SOLN
0.5000 mg | Freq: Once | INTRAMUSCULAR | Status: AC
  Administered 2019-08-18: 12:00:00 0.5 mg via INTRAVENOUS

## 2019-08-18 MED ORDER — CARVEDILOL 3.125 MG PO TABS
3.1250 mg | ORAL_TABLET | Freq: Two times a day (BID) | ORAL | Status: DC
  Filled 2019-08-18: qty 1

## 2019-08-18 MED ORDER — DEXMEDETOMIDINE HCL IN NACL 400 MCG/100ML IV SOLN
0.4000 ug/kg/h | INTRAVENOUS | Status: DC
  Administered 2019-08-18: 0.6 ug/kg/h via INTRAVENOUS
  Administered 2019-08-18: 0.8 ug/kg/h via INTRAVENOUS
  Administered 2019-08-18 – 2019-08-20 (×2): 0.4 ug/kg/h via INTRAVENOUS
  Administered 2019-08-20: 0.3 ug/kg/h via INTRAVENOUS
  Filled 2019-08-18 (×6): qty 100

## 2019-08-18 MED ORDER — HALOPERIDOL LACTATE 5 MG/ML IJ SOLN
1.0000 mg | Freq: Four times a day (QID) | INTRAMUSCULAR | Status: DC | PRN
  Administered 2019-08-21: 3 mg via INTRAVENOUS
  Filled 2019-08-18: qty 1

## 2019-08-18 NOTE — Progress Notes (Signed)
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  She did not have fevers or sick contacts he did have one episode of loose stools.  She lives at home with her husband. Admission chest x-ray showed cardiomegaly and bilateral interstitial infiltrates.  Oxygen saturation was 88% on room air which improved on 3 L.  She was afebrile.  Overnight oxygen requirements have increased to 8 L oxygen this morning and she was tachypneic hence transferred to the ICU and PCCM is consulted.  Admission labs significant for BNP of 3149 lymphopenia She was given Lasix and diuresed 200 cc  SIGNIFICANT PAST MEDICAL HISTORY   Chronic systolic heart failure EF 30 to 35% Hypertension, hypothyroidism, hyperlipidemia, obesity  SIGNIFICANT EVENTS:  10/26 transferred to ICU  STUDIES:   Echo 02/2018 EF 35 to 40%  CULTURES:  SARS COV-2 10/25 >> POS  ANTIBIOTICS:    LINES/TUBES:    CONSULTANTS:   SUBJECTIVE:  Had another episode of bradycardia, desaturation and worsening mental status yesterday that improved with BiPAP No events since BiPAP was started  CONSTITUTIONAL: BP (!) 133/44   Pulse 72   Temp (!) 97.4 F (36.3 C) (Axillary)   Resp (!) 24   Ht 5\' 5"  (1.651 m)   Wt (!) 142.1 kg   LMP 10/28/2011   SpO2 95%   BMI 52.13 kg/m   I/O last 3  completed shifts: In: 1995.4 [P.O.:960; I.V.:100; Blood:685.4; IV Piggyback:250] Out: 1900 [Urine:1900]  Vent Mode: BIPAP FiO2 (%):  [80 %-100 %] 100 % Set Rate:  [15 bmp] 15 bmp PEEP:  [8 cmH20] 8 cmH20  PHYSICAL EXAM: General: Acute on chronically ill appearing morbidly obese female, NAD on BiPAP HEENT: [place]/AT, PERRL, EOM-I and MMM Heart: RRR, Nl S1/S2 and -M/R/G Lung: Distant and diminished Abdomen: Very obese, soft, NT, ND and +BS Ext: 1+ edema and -tenderness Neuro: Alert and interactive, moving all ext to command  I reviewed CXR myself, worsening infiltrate  Discussed with TRH-MD  RESOLVED PROBLEM LIST   ASSESSMENT AND PLAN    Acute hypoxic respiratory failure-use heated high flow nasal cannula and this seems to have decreased her work of breathing - Cycle between BiPAP and HFNC at this point - Low threshold for intubation at this point as patient is not making much headway - Will consider renal consult for volume management, we are not making much headway with lasix - Monitor I/O - Continue decadron - Continue remdesivir  - Lovenox COVID dose - Titrate O2 for sat of 85 or higher - Concern is that if intubated then will require tracheostomy due to chronic health conditions and body habitus - Monitor in the ICU given high chances of deterioration  COVID-19 positive test (U07.1, COVID-19) with Acute Pneumonia (J12.89, Other viral pneumonia) - Decadron and remdesivir - Inflammatory markers for triad - Anticoagulation to COVID dose  Chronic systolic  heart failure -Lasix 40 every 12 today and can transition to once daily. Continue Coreg and Entresto and Aldactone  Will hold diuretics due to renal function.  Renal consult for ?CRRT vs HD.  Close to requiring intubation at this point but will hold til last resort.  Spoke with patient personally and the husband over the phone.  Relayed concerns for need for intubation and the fact that the patient may decompensate given  cardiac, pulmonary and renal issue as well as the overall size of the patient.  After a long discussion.  Patient was agreeable that short term intubation and dialysis are acceptable options but that longterm vent support, CPR or cardioversion are not something she would be interested in.  Will change code status to LCB with no CPR and cardioversion, short term intubation only, no trach or peg placement.  PCCM will continue to follow  SUMMARY OF TODAY'S PLAN:  She has stabilized after transfer to ICU, can transfer to [facility] once bed available  Best Practice / Goals of Care / Disposition.   DVT PROPHYLAXIS: Lovenox SUP: Pepcid NUTRITION: Diet MOBILITY: Out of bed to chair GOALS OF CARE:N/A FAMILY DISCUSSIONS: Patient informed DISPOSITION ICU  LABS  Glucose Recent Labs  Lab 08/16/19 2118 08/17/19 0803 08/17/19 1115 08/17/19 1416 08/17/19 1715 08/18/19 0806  GLUCAP 184* 123* 194* 163* 178* 95    BMET Recent Labs  Lab 08/16/19 1158  08/17/19 0415 08/17/19 1412 08/17/19 1525 08/18/19 0741  NA 136   < > 139 137 137 137  K 4.0   < > 4.9 4.6 4.7 5.0  CL 103  --  104  --   --  105  CO2 22  --  21*  --   --  21*  BUN 49*  --  70*  --   --  91*  CREATININE 1.25*  --  1.88*  --   --  2.42*  GLUCOSE 177*  --  157*  --   --  107*   < > = values in this interval not displayed.    Liver Enzymes Recent Labs  Lab 08/16/19 1158 08/17/19 0415 08/18/19 0741  AST 31 38 53*  ALT 19 19 23   ALKPHOS 54 58 61  BILITOT 0.9 0.6 1.0  ALBUMIN 3.1* 3.2* 3.2*    Electrolytes Recent Labs  Lab 08/16/19 1158 08/17/19 0415 08/18/19 0741  CALCIUM 8.2* 8.4* 8.3*  MG 2.5* 2.5* 2.6*  PHOS 3.5 4.1 4.8*    CBC Recent Labs  Lab 08/16/19 1158  08/17/19 0415 08/17/19 1412 08/17/19 1525 08/18/19 0741  WBC 12.8*  --  15.3*  --   --  16.0*  HGB 13.3   < > 13.2 13.6 13.9 13.8  HCT 41.6   < > 41.4 40.0 41.0 43.0  PLT 154  --  164  --   --  176   < > = values in this interval not  displayed.    ABG Recent Labs  Lab 08/16/19 2249 08/17/19 1412 08/17/19 1525  PHART 7.350 7.370 7.357  PCO2ART 31.9* 32.2 33.3  PO2ART 36.0* 43.0* 53.0*    Coag's Recent Labs  Lab 08/17/19 1320 08/18/19 0741  INR 1.1 1.1    Sepsis Markers Recent Labs  Lab 07/28/2019 1033 07/24/2019 1140 08/07/2019 1702  LATICACIDVEN  --  0.8 1.1  PROCALCITON <0.10  --   --    Cardiac Enzymes No results for input(s): TROPONINI, PROBNP in the last 168 hours.  The patient is  critically ill with multiple organ systems failure and requires high complexity decision making for assessment and support, frequent evaluation and titration of therapies, application of advanced monitoring technologies and extensive interpretation of multiple databases.   Critical Care Time devoted to patient care services described in this note is  35  Minutes. This time reflects time of care of this signee Dr Jennet Maduro. This critical care time does not reflect procedure time, or teaching time or supervisory time of PA/NP/Med student/Med Resident etc but could involve care discussion time.  Rush Farmer, M.D. [facility] Pulmonary/Critical Care Medicine.

## 2019-08-18 NOTE — Progress Notes (Addendum)
Error in charting. Wrong pt chart.

## 2019-08-18 NOTE — Progress Notes (Signed)
Called patient obtunded on precedex 1.2, arousable and protecting airway.  No need for intubation.  Decrease precedex and re-evaluate.  Rush Farmer, M.D. [facility] Pulmonary/Critical Care Medicine.

## 2019-08-18 NOTE — Progress Notes (Signed)
PROGRESS NOTE  Brittney Ross [id number] DOB: 11-03-1960 DOA: 07/28/2019  PCP: Azzie Glatter, FNP  Brief History/Interval Summary: 58 year old with a history of systolic congestive heart failure (30-35% EF), HTN, HLD, obesity, and hypothyroidism who presented with complaints of severe shortness of breath with a cough which had been worsening over approximately 2 days.  Upon arrival in the ED she was found to be hypoxic at 88% on room air.  A chest x-ray suggested only pulmonary vascular congestion.  Shortly after her admission to [facility] she developed significant increased work of breathing with respiratory rates in the 40-50s.  Her Covid test subsequently returned positive and treatment was initiated with Decadron, remdesivir, and Actemra.   Reason for Visit: Acute respiratory failure with hypoxia.  Pneumonia due to COVID-19.  Consultants: Pulmonology  Procedures:   Transthoracic echocardiogram 1. Left ventricular ejection fraction, by visual estimation, is 55 to 60%. The left ventricle has normal function. Normal left ventricular size. There is mildly increased left ventricular hypertrophy.  2. Left ventricular diastolic Doppler parameters are consistent with impaired relaxation pattern of LV diastolic filling.  3. Apical window is foreshortened.  4. Global right ventricle has normal systolic function.The right ventricular size is normal. No increase in right ventricular wall thickness.  5. Left atrial size was normal.  6. Right atrial size was normal.  7. Mild to moderate mitral annular calcification.  8. The mitral valve is grossly normal. Trace mitral valve regurgitation.  9. The tricuspid valve is normal in structure. Tricuspid valve regurgitation is trivial. 10. The aortic valve is tricuspid Aortic valve regurgitation was not visualized by color flow Doppler. Mild to moderate aortic valve sclerosis/calcification without any evidence of aortic stenosis. 11. The pulmonic  valve was not well visualized. Pulmonic valve regurgitation is not visualized by color flow Doppler. 12. The left ventricular function has improved.   Antibiotics: Anti-infectives (From admission, onward)   Start     Dose/Rate Route Frequency Ordered Stop   08/16/19 1000  remdesivir 100 mg in sodium chloride 0.9 % 250 mL IVPB     100 mg 500 mL/hr over 30 Minutes Intravenous Every 24 hours 08/15/19 1004 08/20/19 0959   08/15/19 1100  remdesivir 200 mg in sodium chloride 0.9 % 250 mL IVPB     200 mg 500 mL/hr over 30 Minutes Intravenous Once 08/15/19 1004 08/15/19 1200      Subjective/Interval History: Patient on BiPAP this morning.  Seems a little fatigued.  She however is able to answer questions.  No improvement noted.    Assessment/Plan:  Acute Hypoxic Resp. Failure/Pneumonia due to COVID-19  Vent Mode: BIPAP FiO2 (%):  [80 %-100 %] 100 % Set Rate:  [15 bmp] 15 bmp PEEP:  [8 cmH20] 8 cmH20     Component Value Date/Time   PHART 7.357 08/17/2019 1525   PCO2ART 33.3 08/17/2019 1525   PO2ART 53.0 (L) 08/17/2019 1525   HCO3 18.8 (L) 08/17/2019 1525   TCO2 20 (L) 08/17/2019 1525   ACIDBASEDEF 6.0 (H) 08/17/2019 1525   O2SAT 87.0 08/17/2019 1525    COVID-19 Labs  Recent Labs    08/16/19 1158 08/16/19 1230 08/18/19 0741  DDIMER 0.94*  --  0.85*  FERRITIN  --  1,159*  --   LDH 412*  --   --   CRP  --  21.5* 8.3*    Lab Results  Component Value Date   SARSCOV2NAA POSITIVE (A) 07/29/2019    Fever: Remains afebrile Oxygen requirements: Required BiPAP  yesterday due to worsening respiratory status.  Noted to be saturating in the early to mid 72s.   Antibacterials: None Remdesivir: Day 4 Steroids: Dexamethasone 6 mg daily Diuretics: Lasix being given daily based on volume status. Actemra: 1 dose given on 10/26 Convalescent plasma: Transfused on 10/28 DVT Prophylaxis: On Lovenox as part of the Covid pact trial   Research Studies: COVID PACT Trial  Patient's  respiratory status remains tenuous.  Pulmonology is following.  She is continues to require BiPAP.  Trying to avoid intubation as much as possible.  From a COVID-19 treatment standpoint patient remains on remdesivir and steroids.  Patient was given Actemra and transfused convalescent plasma as well.  CRP has improved to 8.3.  D-dimer 0.85. Continue incentive spirometry and mobilization as much as possible.  Acute on chronic systolic CHF Based on echocardiogram from 2019 her EF was 35 to 40%.  Echocardiogram done during this hospitalization shows improvement in EF to 55%.  BNP was elevated at 314.  Patient has been getting diuretics based on her volume status every day.  Patient is on carvedilol and Entresto.  Also on Aldactone.  Monitor renal function.  Rate mean is noted to be elevated today.  Hold Entresto.  Acute kidney injury Baseline creatinine around 1.7.  BUN and creatinine significantly elevated today.  This is most likely due to diuretics.  Hold nephrotoxic agents.  We will hold Entresto.    Morbid obesity Estimated body mass index is 52.13 kg/m as calculated from the following:   Height as of this encounter: 5\' 5"  (1.651 m).   Weight as of this encounter: 142.1 kg.  Hyperlipidemia Continue statin.  Hypothyroidism Continue Synthroid.  Diabetes mellitus type 2, uncontrolled with hyperglycemia HbA1c 7.0.  CBGs are reasonably well controlled.  Continue Lantus.  Watch out for hypoglycemic episodes.  Essential hypertension Continue to monitor blood pressures.  Will not be too aggressive with control at this time.    FEN Not on any IV fluids.  Monitor electrolytes.  Keep her on liquids due to tenuous respiratory status in case she needs to be intubated.   DVT Prophylaxis:  COVID PACT Trial PUD Prophylaxis: Continue Protonix Code Status: Full code Family Communication: Discussed with her son yesterday.  Spoke to patient's husband over the phone today. Disposition Plan: Remain in  ICU for now   Medications:  Scheduled: . aspirin EC  81 mg Oral Daily  . atorvastatin  80 mg Oral Daily  . carvedilol  6.25 mg Oral BID  . Chlorhexidine Gluconate Cloth  6 each Topical Daily  . dexamethasone (DECADRON) injection  6 mg Intravenous Q24H  . enoxaparin (LOVENOX) injection  1 mg/kg Subcutaneous Q12H  . insulin aspart  0-9 Units Subcutaneous TID WC  . insulin glargine  12 Units Subcutaneous QHS  . levothyroxine  125 mcg Oral QAC breakfast  . mouth rinse  15 mL Mouth Rinse BID  . [START ON 08/19/2019] pantoprazole  40 mg Oral Daily   Continuous: . DOPamine Stopped (08/18/19 0800)  . remdesivir 100 mg in NS 250 mL 100 mg (08/18/19 0839)   [id number], albuterol, lip balm, oxyCODONE, sodium chloride, sodium chloride flush, traMADol   Objective:  Vital Signs  Vitals:   08/18/19 0700 08/18/19 0726 08/18/19 0800 08/18/19 1025  BP: (!) 133/44 (!) 133/44  (!) 158/31  Pulse: 70 72    Resp: (!) 31 (!) 24    Temp:   (!) 97.4 F (36.3 C)   TempSrc:   Axillary  SpO2: 91% 95%    Weight:      Height:        Intake/Output Summary (Last 24 hours) at 08/18/2019 1257 Last data filed at 08/18/2019 0900 Gross per 24 hour  Intake 1265.42 ml  Output 1400 ml  Net -134.58 ml   Filed Weights   08/15/19 1058 08/16/19 0106 08/18/19 0500  Weight: (!) 141.7 kg (!) 141.1 kg (!) 142.1 kg    General appearance: Awake alert.  In no distress.  Morbidly obese Resp: Noted to be tachypneic.  Some use of accessory muscles noted.  Crackles bilaterally at the bases.  No wheezing or rhonchi. Cardio: S1-S2 is normal regular.  No S3-S4.  No rubs murmurs or bruit GI: Abdomen is soft.  Nontender nondistended.  Bowel sounds are present normal.  No masses organomegaly Extremities: No edema.  Full range of motion of lower extremities. Neurologic: Alert and oriented x3.  No focal neurological deficits.      Lab Results:  Data Reviewed: I have personally reviewed following labs and  imaging studies  CBC: Recent Labs  Lab 08/02/2019 1140 08/15/19 0342  08/16/19 1158 08/16/19 2249 08/17/19 0415 08/17/19 1412 08/17/19 1525 08/18/19 0741  WBC 10.8* 9.2  --  12.8*  --  15.3*  --   --  16.0*  NEUTROABS 8.8*  --   --  11.2*  --   --   --   --   --   HGB 12.9 13.3   < > 13.3 13.9 13.2 13.6 13.9 13.8  HCT 41.0 43.1   < > 41.6 41.0 41.4 40.0 41.0 43.0  MCV 90.5 92.5  --  90.0  --  89.2  --   --  89.4  PLT 144* 149*  --  154  --  164  --   --  176   < > = values in this interval not displayed.    Basic Metabolic Panel: Recent Labs  Lab 08/03/2019 1140 08/15/19 0342  08/16/19 1158 08/16/19 2249 08/17/19 0415 08/17/19 1412 08/17/19 1525 08/18/19 0741  NA 135 134*   < > 136 136 139 137 137 137  K 3.4* 3.7   < > 4.0 4.4 4.9 4.6 4.7 5.0  CL 104 101  --  103  --  104  --   --  105  CO2 21* 22  --  22  --  21*  --   --  21*  GLUCOSE 114* 116*  --  177*  --  157*  --   --  107*  BUN 15 19  --  49*  --  70*  --   --  91*  CREATININE 0.74 1.06*  --  1.25*  --  1.88*  --   --  2.42*  CALCIUM 8.5* 8.3*  --  8.2*  --  8.4*  --   --  8.3*  MG  --   --   --  2.5*  --  2.5*  --   --  2.6*  PHOS  --   --   --  3.5  --  4.1  --   --  4.8*   < > = values in this interval not displayed.    GFR: Estimated Creatinine Clearance: 36.8 mL/min (A) (by C-G formula based on SCr of 2.42 mg/dL (H)).  Liver Function Tests: Recent Labs  Lab 08/13/2019 1140 08/15/19 0342 08/16/19 1158 08/17/19 0415 08/18/19 0741  AST 13* 16 31 38 53*  ALT 15  15 19 19 23   ALKPHOS 64 56 54 58 61  BILITOT 1.4* 1.3* 0.9 0.6 1.0  PROT 7.5 7.8 7.7 7.8 7.8  ALBUMIN 3.4* 3.2* 3.1* 3.2* 3.2*    HbA1C: No results for input(s): HGBA1C in the last 72 hours.  CBG: Recent Labs  Lab 08/17/19 1416 08/17/19 1715 08/17/19 2014 08/18/19 0806 08/18/19 1158  GLUCAP 163* 178* 172* 95 144*     Anemia Panel: Recent Labs    08/16/19 1230  FERRITIN 1,159*    Recent Results (from the past 240 hour(s))   SARS CORONAVIRUS 2 (TAT 6-24 HRS) Nasopharyngeal Nasopharyngeal Swab     Status: Abnormal   Collection Time: 07/29/2019  1:06 PM   Specimen: Nasopharyngeal Swab  Result Value Ref Range Status   SARS Coronavirus 2 POSITIVE (A) NEGATIVE Final    Comment: RESULT CALLED TO, READ BACK BY AND VERIFIED WITH: Reita Chard, RN AT [phone] ON 08/15/2019 BY SAINVILUS S (NOTE) SARS-CoV-2 target nucleic acids are DETECTED. The SARS-CoV-2 RNA is generally detectable in upper and lower respiratory specimens during the acute phase of infection. Positive results are indicative of active infection with SARS-CoV-2. Clinical  correlation with patient history and other diagnostic information is necessary to determine patient infection status. Positive results do  not rule out bacterial infection or co-infection with other viruses. The expected result is Negative. Fact Sheet for Patients: SugarRoll.be Fact Sheet for Healthcare Providers: https://www.woods-mathews.com/ This test is not yet approved or cleared by the [place] FDA and  has been authorized for detection and/or diagnosis of SARS-CoV-2 by FDA under an Emergency Use Authorization (EUA). This EUA will remain  in effect (meaning this test  can be used) for the duration of the COVID-19 declaration under Section 564(b)(1) of the Act, 21 U.S.C. section 360bbb-3(b)(1), unless the authorization is terminated or revoked sooner. Performed at [facility], [place] [address] [place], [place] [address]   MRSA PCR Screening     Status: None   Collection Time: 08/15/19 10:59 AM   Specimen: Nasal Mucosa; Nasopharyngeal  Result Value Ref Range Status   MRSA by PCR NEGATIVE NEGATIVE Final    Comment:        The GeneXpert MRSA Assay (FDA approved for NASAL specimens only), is one component of a comprehensive MRSA colonization surveillance program. It is not intended to diagnose MRSA infection nor to guide or  monitor treatment for MRSA infections. Performed at [facility], [place] [address] [place], [place] [address]       Radiology Studies: Dg Chest Port 1 View  Result Date: 08/18/2019 CLINICAL DATA:  ET tube EXAM: PORTABLE CHEST 1 VIEW COMPARISON:  08/16/2019 FINDINGS: No endotracheal tube visualized. Very low lung volumes and diffuse bilateral airspace disease again noted. Minimal improvement since prior scabs that slight improvement in aeration since prior study. Suspect cardiomegaly. Vascular congestion. No visible effusions or pneumothorax. IMPRESSION: Very low lung volumes and severe diffuse bilateral airspace disease with minimal improved since prior study. Cardiomegaly, vascular congestion. Electronically Signed   By: Rolm Baptise M.D.   On: 08/18/2019 08:44       LOS: 4 days   [facility] Pager on [email or web address]  08/18/2019, 12:57 PM

## 2019-08-18 NOTE — Progress Notes (Signed)
Pt had increasing agitation and was ripping off the BiPAP constantly.  Managed to get NRB on the patient and paged MD - No response.  Contacted charge who was in a rapid response.  Contacted RT who came to assist in getting BiPAP back on.  Pt O2 sat dropped into the 30's and took a while to return.  She is currently obtunded.  Notified MD.

## 2019-08-18 NOTE — Progress Notes (Signed)
Patient spouse, Jeneen Rinks, called and updated on POC and patient status. All questions answered at present time. Will continue with POC.

## 2019-08-19 DIAGNOSIS — I5041 Acute combined systolic (congestive) and diastolic (congestive) heart failure: Secondary | ICD-10-CM | POA: Diagnosis not present

## 2019-08-19 DIAGNOSIS — J9601 Acute respiratory failure with hypoxia: Secondary | ICD-10-CM | POA: Diagnosis not present

## 2019-08-19 DIAGNOSIS — R579 Shock, unspecified: Secondary | ICD-10-CM | POA: Diagnosis not present

## 2019-08-19 DIAGNOSIS — R001 Bradycardia, unspecified: Secondary | ICD-10-CM | POA: Diagnosis not present

## 2019-08-19 LAB — CBC
HCT: 44.4 % (ref 36.0–46.0)
Hemoglobin: 14 g/dL (ref 12.0–15.0)
MCH: 28.2 pg (ref 26.0–34.0)
MCHC: 31.5 g/dL (ref 30.0–36.0)
MCV: 89.5 fL (ref 80.0–100.0)
Platelets: 166 10*3/uL (ref 150–400)
RBC: 4.96 MIL/uL (ref 3.87–5.11)
RDW: 14.9 % (ref 11.5–15.5)
WBC: 14.2 10*3/uL — ABNORMAL HIGH (ref 4.0–10.5)
nRBC: 0 % (ref 0.0–0.2)

## 2019-08-19 LAB — GLUCOSE, CAPILLARY
Glucose-Capillary: 124 mg/dL — ABNORMAL HIGH (ref 70–99)
Glucose-Capillary: 142 mg/dL — ABNORMAL HIGH (ref 70–99)
Glucose-Capillary: 200 mg/dL — ABNORMAL HIGH (ref 70–99)
Glucose-Capillary: 170 mg/dL — ABNORMAL HIGH (ref 70–99)

## 2019-08-19 LAB — MAGNESIUM: Magnesium: 2.8 mg/dL — ABNORMAL HIGH (ref 1.7–2.4)

## 2019-08-19 LAB — COMPREHENSIVE METABOLIC PANEL
ALT: 24 U/L (ref 0–44)
AST: 42 U/L — ABNORMAL HIGH (ref 15–41)
Albumin: 3.4 g/dL — ABNORMAL LOW (ref 3.5–5.0)
Alkaline Phosphatase: 66 U/L (ref 38–126)
Anion gap: 11 (ref 5–15)
BUN: 106 mg/dL — ABNORMAL HIGH (ref 6–20)
CO2: 19 mmol/L — ABNORMAL LOW (ref 22–32)
Calcium: 8.2 mg/dL — ABNORMAL LOW (ref 8.9–10.3)
Chloride: 107 mmol/L (ref 98–111)
Creatinine, Ser: 2.56 mg/dL — ABNORMAL HIGH (ref 0.44–1.00)
GFR calc Af Amer: 23 mL/min — ABNORMAL LOW (ref >60)
GFR calc non Af Amer: 20 mL/min — ABNORMAL LOW (ref >60)
Glucose, Bld: 162 mg/dL — ABNORMAL HIGH (ref 70–99)
Potassium: 5.4 mmol/L — ABNORMAL HIGH (ref 3.5–5.1)
Sodium: 137 mmol/L (ref 135–145)
Total Bilirubin: 0.9 mg/dL (ref 0.3–1.2)
Total Protein: 7.6 g/dL (ref 6.5–8.1)

## 2019-08-19 LAB — HEPARIN ANTI-XA: Heparin LMW: >2 IU/mL

## 2019-08-19 LAB — BASIC METABOLIC PANEL
Anion gap: 13 (ref 5–15)
BUN: 107 mg/dL — ABNORMAL HIGH (ref 6–20)
CO2: 19 mmol/L — ABNORMAL LOW (ref 22–32)
Calcium: 8.4 mg/dL — ABNORMAL LOW (ref 8.9–10.3)
Chloride: 107 mmol/L (ref 98–111)
Creatinine, Ser: 2.01 mg/dL — ABNORMAL HIGH (ref 0.44–1.00)
GFR calc Af Amer: 31 mL/min — ABNORMAL LOW (ref >60)
GFR calc non Af Amer: 27 mL/min — ABNORMAL LOW (ref >60)
Glucose, Bld: 217 mg/dL — ABNORMAL HIGH (ref 70–99)
Potassium: 4.9 mmol/L (ref 3.5–5.1)
Sodium: 139 mmol/L (ref 135–145)

## 2019-08-19 LAB — PROTIME-INR
INR: 1.1 (ref 0.8–1.2)
Prothrombin Time: 14.2 seconds (ref 11.4–15.2)

## 2019-08-19 LAB — D-DIMER, QUANTITATIVE: D-Dimer, Quant: 1.37 ug/mL-FEU — ABNORMAL HIGH (ref 0.00–0.50)

## 2019-08-19 LAB — PHOSPHORUS: Phosphorus: 5.7 mg/dL — ABNORMAL HIGH (ref 2.5–4.6)

## 2019-08-19 LAB — C-REACTIVE PROTEIN: CRP: 6 mg/dL — ABNORMAL HIGH (ref <1.0)

## 2019-08-19 MED ORDER — PRO-STAT SUGAR FREE PO LIQD
30.0000 mL | Freq: Three times a day (TID) | ORAL | Status: DC
  Filled 2019-08-19: qty 30

## 2019-08-19 MED ORDER — DOCUSATE SODIUM 100 MG PO CAPS
100.0000 mg | ORAL_CAPSULE | Freq: Two times a day (BID) | ORAL | Status: DC
  Administered 2019-08-19 – 2019-08-22 (×7): 100 mg via ORAL
  Filled 2019-08-19 (×7): qty 1

## 2019-08-19 MED ORDER — SODIUM ZIRCONIUM CYCLOSILICATE 10 G PO PACK
10.0000 g | PACK | Freq: Once | ORAL | Status: AC
  Administered 2019-08-19: 10 g via ORAL
  Filled 2019-08-19: qty 1

## 2019-08-19 MED ORDER — FAMOTIDINE IN NACL 20-0.9 MG/50ML-% IV SOLN
20.0000 mg | Freq: Two times a day (BID) | INTRAVENOUS | Status: DC
  Administered 2019-08-19 – 2019-08-22 (×7): 20 mg via INTRAVENOUS
  Filled 2019-08-19 (×7): qty 50

## 2019-08-19 MED ORDER — POLYETHYLENE GLYCOL 3350 17 G PO PACK
17.0000 g | PACK | Freq: Every day | ORAL | Status: DC
  Administered 2019-08-19 – 2019-08-22 (×3): 17 g via ORAL
  Filled 2019-08-19 (×3): qty 1

## 2019-08-19 MED ORDER — NEPRO/CARBSTEADY PO LIQD
237.0000 mL | Freq: Three times a day (TID) | ORAL | Status: DC
  Administered 2019-08-21 – 2019-08-22 (×3): 237 mL via ORAL
  Filled 2019-08-19 (×12): qty 237

## 2019-08-19 NOTE — Progress Notes (Signed)
I spoke with Brittney Ross, pt's son, and his fiance for the main update of the shift. Questions asked were about the sedation she was on, and the plan for her heart and kidney monitoring.  I also spoke with pt's oldest son, Brittney Ross, who was concerned that he was not able to get in touch with his brother for a sufficient update. I explained the "why" behind updating a single family member, and provided an opportunity for him to facetime with the pt, which they both enjoyed. The pt called her husband from her own device and updated him.

## 2019-08-19 NOTE — Progress Notes (Signed)
PROGRESS NOTE  Brittney Ross [id number] DOB: [birth year] DOA: 07/26/2019  PCP: Azzie Glatter, FNP  Brief History/Interval Summary: 58 year old with a history of systolic congestive heart failure (30-35% EF), HTN, HLD, obesity, and hypothyroidism who presented with complaints of severe shortness of breath with a cough which had been worsening over approximately 2 days.  Upon arrival in the ED she was found to be hypoxic at 88% on room air.  A chest x-ray suggested only pulmonary vascular congestion.  Shortly after her admission to [facility] she developed significant increased work of breathing with respiratory rates in the 40-50s.  Her Covid test subsequently returned positive and treatment was initiated with Decadron, remdesivir, and Actemra.   Reason for Visit: Acute respiratory failure with hypoxia.  Pneumonia due to COVID-19.  Consultants: Pulmonology  Procedures:   Transthoracic echocardiogram 1. Left ventricular ejection fraction, by visual estimation, is 55 to 60%. The left ventricle has normal function. Normal left ventricular size. There is mildly increased left ventricular hypertrophy.  2. Left ventricular diastolic Doppler parameters are consistent with impaired relaxation pattern of LV diastolic filling.  3. Apical window is foreshortened.  4. Global right ventricle has normal systolic function.The right ventricular size is normal. No increase in right ventricular wall thickness.  5. Left atrial size was normal.  6. Right atrial size was normal.  7. Mild to moderate mitral annular calcification.  8. The mitral valve is grossly normal. Trace mitral valve regurgitation.  9. The tricuspid valve is normal in structure. Tricuspid valve regurgitation is trivial. 10. The aortic valve is tricuspid Aortic valve regurgitation was not visualized by color flow Doppler. Mild to moderate aortic valve sclerosis/calcification without any evidence of aortic stenosis. 11. The pulmonic  valve was not well visualized. Pulmonic valve regurgitation is not visualized by color flow Doppler. 12. The left ventricular function has improved.   Antibiotics: Anti-infectives (From admission, onward)   Start     Dose/Rate Route Frequency Ordered Stop   08/16/19 1000  remdesivir 100 mg in sodium chloride 0.9 % 250 mL IVPB     100 mg 500 mL/hr over 30 Minutes Intravenous Every 24 hours 08/15/19 1004 08/19/19 1141   08/15/19 1100  remdesivir 200 mg in sodium chloride 0.9 % 250 mL IVPB     200 mg 500 mL/hr over 30 Minutes Intravenous Once 08/15/19 1004 08/15/19 1200      Subjective/Interval History: Patient noted to be on a nonrebreather this morning.  She is awake.  She responds appropriately.  Follows commands.  States that she is feeling better.  Denies any pain.   Assessment/Plan:  Acute Hypoxic Resp. Failure/Pneumonia due to COVID-19  Vent Mode: BIPAP FiO2 (%):  [60 %-100 %] 60 % Set Rate:  [15 bmp] 15 bmp PEEP:  [8 cmH20-10 cmH20] 10 cmH20 Pressure Support:  [12 cmH20] 12 cmH20     Component Value Date/Time   PHART 7.361 08/18/2019 2118   PCO2ART 34.8 08/18/2019 2118   PO2ART 57.0 (L) 08/18/2019 2118   HCO3 19.8 (L) 08/18/2019 2118   TCO2 21 (L) 08/18/2019 2118   ACIDBASEDEF 5.0 (H) 08/18/2019 2118   O2SAT 89.0 08/18/2019 2118    COVID-19 Labs  Recent Labs    08/18/19 0741 08/19/19 0420  DDIMER 0.85* 1.37*  CRP 8.3* 6.0*    Lab Results  Component Value Date   SARSCOV2NAA POSITIVE (A) 07/29/2019    Fever: Remains afebrile Oxygen requirements: Required BiPAP for most of the day yesterday.  Also noted to  be on nonrebreather at times.  Saturating in the early 90s.   Antibacterials: None Remdesivir: Day 5 Steroids: Dexamethasone 6 mg daily Diuretics: Lasix being given daily based on volume status. Actemra: 1 dose given on 10/26 Convalescent plasma: Transfused on 10/28 DVT Prophylaxis: On Lovenox as part of the Covid pact trial   Research Studies:  COVID PACT Trial  Patient's respiratory status remains tenuous.  Pulmonology is following.  She was placed on BiPAP which he seems to be tolerating okay for the most part.  Yesterday she got obtunded due to Precedex the dose of which was decreased.  Mentation appears to be stable this morning.  She is responding appropriately.  No focal deficits noted.  From a COVID-19 standpoint she will complete course of remdesivir yesterday.  She remains on steroids.  She was given Actemra and convalescent plasma.  CRP has improved to 6.0.  D-dimer 1.37 today.  Continue incentive spirometry mobilization as much as possible.  Due to her morbid obesity she remains at high risk for decompensation.    Acute on chronic systolic CHF/concern for acute diastolic CHF Based on echocardiogram from 2019 her EF was 35 to 40%.  Echocardiogram done during this hospitalization shows improvement in EF to 55%.  At the time of admission BNP was elevated at 314.  Patient was getting diuretics.  Entresto and carvedilol were stopped due to bradycardia and renal failure.    Sinus bradycardia Likely due to medication, Precedex.  Stop her beta-blocker.  Patient on dopamine infusion.  Acute kidney injury/hyperkalemia/normal anion gap metabolic acidosis Baseline creatinine around 1.7.  BUN and creatinine continue to rise.  Urine output seems to be decreasing.  Discussed with critical care medicine.  Nephrology has been consulted.  Lokelma ordered for elevated potassium level.   Morbid obesity Estimated body mass index is 47.4 kg/m as calculated from the following:   Height as of this encounter: 5\' 5"  (1.651 m).   Weight as of this encounter: 129.2 kg.  Hyperlipidemia Continue statin.  Hypothyroidism Continue Synthroid.  Diabetes mellitus type 2, uncontrolled with hyperglycemia HbA1c 7.0.  CBGs are reasonably well controlled.  Continue Lantus and SSI.    Essential hypertension Blood pressure reasonably well controlled.  Will  not be too aggressive with controlling her BP at this time.  Continue to monitor.    FEN Not on any IV fluids.  Monitor electrolytes.     DVT Prophylaxis:  COVID PACT Trial PUD Prophylaxis: Continue Protonix Code Status: Full code Family Communication: Family being updated on a daily basis Disposition Plan: Remain in ICU for now   Medications:  Scheduled: . Chlorhexidine Gluconate Cloth  6 each Topical Daily  . dexamethasone (DECADRON) injection  6 mg Intravenous Q24H  . docusate sodium  100 mg Oral BID  . enoxaparin (LOVENOX) injection  1 mg/kg Subcutaneous Q12H  . insulin aspart  0-9 Units Subcutaneous TID WC  . insulin glargine  12 Units Subcutaneous QHS  . mouth rinse  15 mL Mouth Rinse BID  . polyethylene glycol  17 g Oral Daily   Continuous: . dexmedetomidine (PRECEDEX) IV infusion 0.2 mcg/kg/hr (08/19/19 1200)  . DOPamine 5 mcg/kg/min (08/19/19 1200)  . famotidine (PEPCID) IV 20 mg (08/19/19 1212)   [id number], albuterol, haloperidol lactate, lip balm, oxyCODONE, sodium chloride, sodium chloride flush, traMADol   Objective:  Vital Signs  Vitals:   08/19/19 1204 08/19/19 1300 08/19/19 1315 08/19/19 1400  BP:  (!) 138/51  (!) 110/42  Pulse:  83 81 68  Resp:  (!) 33  (!) 35  Temp:      TempSrc:      SpO2: 90% 90%  94%  Weight:      Height:        Intake/Output Summary (Last 24 hours) at 08/19/2019 1415 Last data filed at 08/19/2019 1400 Gross per 24 hour  Intake 853.39 ml  Output 700 ml  Net 153.39 ml   Filed Weights   08/16/19 0106 08/18/19 0500 08/19/19 0500  Weight: (!) 141.1 kg (!) 142.1 kg 129.2 kg    General appearance: Awake alert.  In no distress.  Morbidly obese Resp: Tachypneic at rest.  No use of accessory muscles.  Coarse breath sounds bilaterally.  Few crackles at the bases.  No wheezing or rhonchi Cardio: S1-S2 is normal regular.  No S3-S4.  No rubs murmurs or bruit GI: Abdomen is soft.  Nontender nondistended.  Bowel sounds are  present normal.  No masses organomegaly Extremities: No edema.  Full range of motion of lower extremities. Neurologic: She is alert.  Following commands..  No focal neurological deficits.       Lab Results:  Data Reviewed: I have personally reviewed following labs and imaging studies  CBC: Recent Labs  Lab 07/29/2019 1140 08/15/19 0342  08/16/19 1158  08/17/19 0415 08/17/19 1412 08/17/19 1525 08/18/19 0741 08/18/19 2118 08/19/19 0420  WBC 10.8* 9.2  --  12.8*  --  15.3*  --   --  16.0*  --  14.2*  NEUTROABS 8.8*  --   --  11.2*  --   --   --   --   --   --   --   HGB 12.9 13.3   < > 13.3   < > 13.2 13.6 13.9 13.8 13.9 14.0  HCT 41.0 43.1   < > 41.6   < > 41.4 40.0 41.0 43.0 41.0 44.4  MCV 90.5 92.5  --  90.0  --  89.2  --   --  89.4  --  89.5  PLT 144* 149*  --  154  --  164  --   --  176  --  166   < > = values in this interval not displayed.    Basic Metabolic Panel: Recent Labs  Lab 08/15/19 0342  08/16/19 1158  08/17/19 0415 08/17/19 1412 08/17/19 1525 08/18/19 0741 08/18/19 2118 08/19/19 0420  NA 134*   < > 136   < > 139 137 137 137 138 137  K 3.7   < > 4.0   < > 4.9 4.6 4.7 5.0 4.9 5.4*  CL 101  --  103  --  104  --   --  105  --  107  CO2 22  --  22  --  21*  --   --  21*  --  19*  GLUCOSE 116*  --  177*  --  157*  --   --  107*  --  162*  BUN 19  --  49*  --  70*  --   --  91*  --  106*  CREATININE 1.06*  --  1.25*  --  1.88*  --   --  2.42*  --  2.56*  CALCIUM 8.3*  --  8.2*  --  8.4*  --   --  8.3*  --  8.2*  MG  --   --  2.5*  --  2.5*  --   --  2.6*  --  2.8*  PHOS  --   --  3.5  --  4.1  --   --  4.8*  --  5.7*   < > = values in this interval not displayed.    GFR: Estimated Creatinine Clearance: 32.9 mL/min (A) (by C-G formula based on SCr of 2.56 mg/dL (H)).  Liver Function Tests: Recent Labs  Lab 08/15/19 0342 08/16/19 1158 08/17/19 0415 08/18/19 0741 08/19/19 0420  AST 16 31 38 53* 42*  ALT 15 19 19 23 24   ALKPHOS 56 54 58 61 66   BILITOT 1.3* 0.9 0.6 1.0 0.9  PROT 7.8 7.7 7.8 7.8 7.6  ALBUMIN 3.2* 3.1* 3.2* 3.2* 3.4*    HbA1C: No results for input(s): HGBA1C in the last 72 hours.  CBG: Recent Labs  Lab 08/18/19 1158 08/18/19 1521 08/18/19 2054 08/19/19 0733 08/19/19 1144  GLUCAP 144* 149* 209* 124* 142*     Anemia Panel: No results for input(s): VITAMINB12, FOLATE, FERRITIN, TIBC, IRON, RETICCTPCT in the last 72 hours.  Recent Results (from the past 240 hour(s))  SARS CORONAVIRUS 2 (TAT 6-24 HRS) Nasopharyngeal Nasopharyngeal Swab     Status: Abnormal   Collection Time: 08/13/2019  1:06 PM   Specimen: Nasopharyngeal Swab  Result Value Ref Range Status   SARS Coronavirus 2 POSITIVE (A) NEGATIVE Final    Comment: RESULT CALLED TO, READ BACK BY AND VERIFIED WITH: Reita Chard, RN AT [phone] ON 08/15/2019 BY SAINVILUS S (NOTE) SARS-CoV-2 target nucleic acids are DETECTED. The SARS-CoV-2 RNA is generally detectable in upper and lower respiratory specimens during the acute phase of infection. Positive results are indicative of active infection with SARS-CoV-2. Clinical  correlation with patient history and other diagnostic information is necessary to determine patient infection status. Positive results do  not rule out bacterial infection or co-infection with other viruses. The expected result is Negative. Fact Sheet for Patients: SugarRoll.be Fact Sheet for Healthcare Providers: https://www.woods-mathews.com/ This test is not yet approved or cleared by the [place] FDA and  has been authorized for detection and/or diagnosis of SARS-CoV-2 by FDA under an Emergency Use Authorization (EUA). This EUA will remain  in effect (meaning this test  can be used) for the duration of the COVID-19 declaration under Section 564(b)(1) of the Act, 21 U.S.C. section 360bbb-3(b)(1), unless the authorization is terminated or revoked sooner. Performed at [facility], [place] [address], [place], [place] [address]   MRSA PCR Screening     Status: None   Collection Time: 08/15/19 10:59 AM   Specimen: Nasal Mucosa; Nasopharyngeal  Result Value Ref Range Status   MRSA by PCR NEGATIVE NEGATIVE Final    Comment:        The GeneXpert MRSA Assay (FDA approved for NASAL specimens only), is one component of a comprehensive MRSA colonization surveillance program. It is not intended to diagnose MRSA infection nor to guide or monitor treatment for MRSA infections. Performed at [facility], [place] [address] [place], [place] [address]       Radiology Studies: Dg Chest Port 1 View  Result Date: 08/18/2019 CLINICAL DATA:  ET tube EXAM: PORTABLE CHEST 1 VIEW COMPARISON:  08/16/2019 FINDINGS: No endotracheal tube visualized. Very low lung volumes and diffuse bilateral airspace disease again noted. Minimal improvement since prior scabs that slight improvement in aeration since prior study. Suspect cardiomegaly. Vascular congestion. No visible effusions or pneumothorax. IMPRESSION: Very low lung volumes and severe diffuse bilateral airspace disease with minimal improved since prior study.  Cardiomegaly, vascular congestion. Electronically Signed   By: Rolm Baptise M.D.   On: 08/18/2019 08:44       LOS: 5 days   [facility] Pager on [email or web address]  08/19/2019, 2:15 PM

## 2019-08-19 NOTE — Progress Notes (Addendum)
ELink MD called d/t patient sustained bradycardia and hypertension. Precedex held, dopamine 58mcg/kg/min. MD states she is okay with HR in 40's as long as patient is hemodynamically stable and to titrate down the dopamine. Will continue to monitor and continue with POC.

## 2019-08-19 NOTE — Progress Notes (Signed)
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  She did not have fevers or sick contacts he did have one episode of loose stools.  She lives at home with her husband. Admission chest x-ray showed cardiomegaly and bilateral interstitial infiltrates.  Oxygen saturation was 88% on room air which improved on 3 L.  She was afebrile.  Overnight oxygen requirements have increased to 8 L oxygen this morning and she was tachypneic hence transferred to the ICU and PCCM is consulted.  Admission labs significant for BNP of 3149 lymphopenia She was given Lasix and diuresed 200 cc  SIGNIFICANT PAST MEDICAL HISTORY   Chronic systolic heart failure EF 30 to 35% Hypertension, hypothyroidism, hyperlipidemia, obesity  SIGNIFICANT EVENTS:  10/26 transferred to ICU  STUDIES:   Echo 02/2018 EF 35 to 40%  CULTURES:  SARS COV-2 10/25 >> POS  ANTIBIOTICS:    LINES/TUBES:    CONSULTANTS:   SUBJECTIVE:  Not tolerating BiPAP this AM but much improved if wears Intermittent bradycardia and hypotension  CONSTITUTIONAL: BP (!) 131/55 (BP Location: Right Leg)   Pulse 60   Temp 98.2 F (36.8 C) (Axillary)   Resp (!) 22   Ht 5\' 5"  (1.651 m)   Wt 129.2 kg   LMP 10/28/2011   SpO2 95%   BMI 47.40 kg/m   I/O last 3 completed shifts: In: 1381.9  [P.O.:240; I.V.:425.3; IV Piggyback:716.6] Out: 1450 [Urine:1450]  Vent Mode: BIPAP FiO2 (%):  [50 %-90 %] 90 % Set Rate:  [15 bmp] 15 bmp PEEP:  [8 cmH20-10 cmH20] 10 cmH20 Pressure Support:  [12 cmH20] 12 cmH20  PHYSICAL EXAM: General: Acute on chronically ill appearing female, NAD HEENT: [place]/AT, PERRL, EOM-I and MMM, BiPAP on Heart: RRR, Nl S1/S2 and -M/R/G Lung: Distant BS diffusely Abdomen: Obese, soft, NT, ND and +BS Ext: 1+ edema and -tenderness Neuro: Alert and interactive, moving all ext to command  I reviewed CXR myself, worsening infiltrate  Discussed with TRH-MD  RESOLVED PROBLEM LIST   ASSESSMENT AND PLAN    Acute hypoxic respiratory failure-use heated high flow nasal cannula and this seems to have decreased her work of breathing - Cycle between BiPAP and NRB at this point - Concern is may get intubated if we have to lay flat to place HD catheter in - Renal consult for volume - Monitor I/O - Continue decadron - Continue remdesivir  - Lovenox COVID dose - Titrate O2 for sat of 85 or higher - Concern is that if intubated then will require tracheostomy due to chronic health conditions and body habitus - Monitor in the ICU given high chances of deterioration - Will call renal for diureses vs CRRT  COVID-19 positive test (U07.1, COVID-19) with Acute Pneumonia (J12.89, Other viral pneumonia) - Decadron and remdesivir - Inflammatory markers per [facility] - Anticoagulation to COVID dose  Chronic systolic heart failure -  Lasix 40 every 12 today and can transition to once daily. Continue Coreg and Entresto and Aldactone  AKI: Renal consult Hold lasix until renal sees patient  Codes status: Short term intubation only, dialysis acceptable, no CPR/cardioversion/trach/peg  PCCM will continue to follow  SUMMARY OF TODAY'S PLAN:  She has stabilized after transfer to ICU, can transfer to [facility] once bed available  [employer] / Goals of Care / Disposition.    DVT PROPHYLAXIS: Lovenox SUP: Pepcid NUTRITION: Diet MOBILITY: Out of bed to chair GOALS OF CARE:N/A FAMILY DISCUSSIONS: Patient informed DISPOSITION ICU  LABS  Glucose Recent Labs  Lab 08/17/19 2014 08/18/19 0806 08/18/19 1158 08/18/19 1521 08/18/19 2054 08/19/19 0733  GLUCAP 172* 95 144* 149* 209* 124*    BMET Recent Labs  Lab 08/17/19 0415  08/18/19 0741 08/18/19 2118 08/19/19 0420  NA 139   < > 137 138 137  K 4.9   < > 5.0 4.9 5.4*  CL 104  --  105  --  107  CO2 21*  --  21*  --  19*  BUN 70*  --  91*  --  106*  CREATININE 1.88*  --  2.42*  --  2.56*  GLUCOSE 157*  --  107*  --  162*   < > = values in this interval not displayed.    Liver Enzymes Recent Labs  Lab 08/17/19 0415 08/18/19 0741 08/19/19 0420  AST 38 53* 42*  ALT 19 23 24   ALKPHOS 58 61 66  BILITOT 0.6 1.0 0.9  ALBUMIN 3.2* 3.2* 3.4*    Electrolytes Recent Labs  Lab 08/17/19 0415 08/18/19 0741 08/19/19 0420  CALCIUM 8.4* 8.3* 8.2*  MG 2.5* 2.6* 2.8*  PHOS 4.1 4.8* 5.7*    CBC Recent Labs  Lab 08/17/19 0415  08/18/19 0741 08/18/19 2118 08/19/19 0420  WBC 15.3*  --  16.0*  --  14.2*  HGB 13.2   < > 13.8 13.9 14.0  HCT 41.4   < > 43.0 41.0 44.4  PLT 164  --  176  --  166   < > = values in this interval not displayed.    ABG Recent Labs  Lab 08/17/19 1412 08/17/19 1525 08/18/19 2118  PHART 7.370 7.357 7.361  PCO2ART 32.2 33.3 34.8  PO2ART 43.0* 53.0* 57.0*    Coag's Recent Labs  Lab 08/17/19 1320 08/18/19 0741 08/19/19 0420  INR 1.1 1.1 1.1    Sepsis Markers Recent Labs  Lab 07/31/2019 1033 07/28/2019 1140 07/31/2019 1702  LATICACIDVEN  --  0.8 1.1  PROCALCITON <0.10  --   --    Cardiac Enzymes No results for input(s): TROPONINI, PROBNP in the last 168 hours.  The patient is critically ill with multiple organ systems failure and requires high complexity decision making for assessment and support, frequent evaluation and titration of therapies,  application of advanced monitoring technologies and extensive interpretation of multiple databases.   Critical Care Time devoted to patient care services described in this note is  32  Minutes. This time reflects time of care of this signee Dr Jennet Maduro. This critical care time does not reflect procedure time, or teaching time or supervisory time of PA/NP/Med student/Med Resident etc but could involve care discussion time.  Rush Farmer, M.D. [facility] Pulmonary/Critical Care Medicine.

## 2019-08-19 NOTE — Progress Notes (Signed)
Initial Nutrition Assessment   RD working remotely.   DOCUMENTATION CODES:   Morbid obesity  INTERVENTION:   Concerned about nutritional status given poor po intake/appetite, increased needs with COVID-19 infection and and poor respiratory and mental status. Pt would likely benefit from insertion of Cortrak tube/small bore NG tube with initiation of Tube Feeding, especially If unable to advance diet and/or po intake remains inadequate   Diet advancement as tolerated  Nepro Shake po TID, each supplement provides 425 kcal and 19 grams protein  30 ml Prostat BID, each supplement provides 100 kcals and 15 grams protein.   Feeding assistance as needed  NUTRITION DIAGNOSIS:   Inadequate oral intake related to acute illness, decreased appetite as evidenced by meal completion < 25%.  GOAL:   Patient will meet greater than or equal to 90% of their needs  MONITOR:   PO intake, Supplement acceptance, Diet advancement, Labs, Weight trends  REASON FOR ASSESSMENT:   Rounds    ASSESSMENT:   58 yo female acute respiratory failure due to COVID 19 infection, AKI.  PMH includes CHF, HTN, HLD, obesity   10/25 Admit 10/26 Transferred to ICU 10/28 Diet downgraded to CL  Pt back and forth between BiPAP and NRB  Pt currently on CL diet. Very limited documentation of po intake. Recorded po intake of 0-15% on 10/28 with pt refusing to eat. No other recorded po intake Appetite has not improved and remains poor but taking liquids better. RN reports pt can be quite lethargic times  Spoke with MD about advancing diet today; MD wanting to hold off as respiratory status is tenuous  Weight today is 129.2 kg which is very different from previous weights. Weight 10/29 142.1 kg, admission weight 144 kg. No weight loss per weight encounters.   Unable to obtain diet and weight history from patient at this time  Labs: potassium 5.4 (H), phosphorus 5.7 (H), BUN 106, Creatinine 2.56, CBGs 124-209  (goal 140-180) Meds: miralax, ss novolog, lantus    Diet Order:   Diet Order            Diet clear liquid Room service appropriate? Yes; Fluid consistency: Thin  Diet effective now              EDUCATION NEEDS:   Not appropriate for education at this time  Skin:  Skin Assessment: Reviewed RN Assessment  Last BM:  10/27  Height:   Ht Readings from Last 1 Encounters:  08/16/19 5\' 5"  (1.651 m)    Weight:   Wt Readings from Last 1 Encounters:  08/19/19 129.2 kg    Ideal Body Weight:  56.8 kg  BMI:  Body mass index is 47.4 kg/m.  Estimated Nutritional Needs:   Kcal:  2000-2300 kcals  Protein:  110-130 g  Fluid:  >/=1.7 L    Kerman Passey MS, RDN, LDN, CNSC [phone] Pager  [phone] Weekend/On-Call Pager

## 2019-08-20 DIAGNOSIS — R001 Bradycardia, unspecified: Secondary | ICD-10-CM | POA: Diagnosis not present

## 2019-08-20 DIAGNOSIS — R579 Shock, unspecified: Secondary | ICD-10-CM | POA: Diagnosis not present

## 2019-08-20 DIAGNOSIS — I5041 Acute combined systolic (congestive) and diastolic (congestive) heart failure: Secondary | ICD-10-CM | POA: Diagnosis not present

## 2019-08-20 DIAGNOSIS — J9601 Acute respiratory failure with hypoxia: Secondary | ICD-10-CM | POA: Diagnosis not present

## 2019-08-20 LAB — GLUCOSE, CAPILLARY
Glucose-Capillary: 104 mg/dL — ABNORMAL HIGH (ref 70–99)
Glucose-Capillary: 138 mg/dL — ABNORMAL HIGH (ref 70–99)
Glucose-Capillary: 235 mg/dL — ABNORMAL HIGH (ref 70–99)
Glucose-Capillary: 118 mg/dL — ABNORMAL HIGH (ref 70–99)

## 2019-08-20 LAB — PROTIME-INR
INR: 1.2 (ref 0.8–1.2)
Prothrombin Time: 15.1 seconds (ref 11.4–15.2)

## 2019-08-20 LAB — MAGNESIUM: Magnesium: 3 mg/dL — ABNORMAL HIGH (ref 1.7–2.4)

## 2019-08-20 LAB — BASIC METABOLIC PANEL
Anion gap: 13 (ref 5–15)
BUN: 109 mg/dL — ABNORMAL HIGH (ref 6–20)
CO2: 18 mmol/L — ABNORMAL LOW (ref 22–32)
Calcium: 8.6 mg/dL — ABNORMAL LOW (ref 8.9–10.3)
Chloride: 109 mmol/L (ref 98–111)
Creatinine, Ser: 2.27 mg/dL — ABNORMAL HIGH (ref 0.44–1.00)
GFR calc Af Amer: 27 mL/min — ABNORMAL LOW (ref >60)
GFR calc non Af Amer: 23 mL/min — ABNORMAL LOW (ref >60)
Glucose, Bld: 142 mg/dL — ABNORMAL HIGH (ref 70–99)
Potassium: 5 mmol/L (ref 3.5–5.1)
Sodium: 140 mmol/L (ref 135–145)

## 2019-08-20 LAB — CBC
HCT: 42.5 % (ref 36.0–46.0)
Hemoglobin: 13.4 g/dL (ref 12.0–15.0)
MCH: 28.6 pg (ref 26.0–34.0)
MCHC: 31.5 g/dL (ref 30.0–36.0)
MCV: 90.6 fL (ref 80.0–100.0)
Platelets: 157 10*3/uL (ref 150–400)
RBC: 4.69 MIL/uL (ref 3.87–5.11)
RDW: 15.1 % (ref 11.5–15.5)
WBC: 17 10*3/uL — ABNORMAL HIGH (ref 4.0–10.5)
nRBC: 0 % (ref 0.0–0.2)

## 2019-08-20 LAB — D-DIMER, QUANTITATIVE: D-Dimer, Quant: 3.06 ug/mL-FEU — ABNORMAL HIGH (ref 0.00–0.50)

## 2019-08-20 LAB — POCT I-STAT EG7
Acid-base deficit: 5 mmol/L — ABNORMAL HIGH (ref 0.0–2.0)
Bicarbonate: 21.2 mmol/L (ref 20.0–28.0)
Calcium, Ion: 1.15 mmol/L (ref 1.15–1.40)
HCT: 41 % (ref 36.0–46.0)
Hemoglobin: 13.9 g/dL (ref 12.0–15.0)
O2 Saturation: 53 %
Patient temperature: 36.5
Potassium: 4.9 mmol/L (ref 3.5–5.1)
Sodium: 142 mmol/L (ref 135–145)
TCO2: 22 mmol/L (ref 22–32)
pCO2, Ven: 39.6 mmHg — ABNORMAL LOW (ref 44.0–60.0)
pH, Ven: 7.335 (ref 7.250–7.430)
pO2, Ven: 29 mmHg — CL (ref 32.0–45.0)

## 2019-08-20 LAB — C-REACTIVE PROTEIN: CRP: 3.8 mg/dL — ABNORMAL HIGH (ref <1.0)

## 2019-08-20 MED ORDER — ATROPINE SULFATE 1 MG/10ML IJ SOSY
PREFILLED_SYRINGE | INTRAMUSCULAR | Status: AC
  Filled 2019-08-20: qty 10

## 2019-08-20 MED ORDER — SODIUM CHLORIDE 0.9 % IV BOLUS
500.0000 mL | Freq: Once | INTRAVENOUS | Status: AC
  Administered 2019-08-20: 500 mL via INTRAVENOUS

## 2019-08-20 MED ORDER — METOLAZONE 5 MG PO TABS
10.0000 mg | ORAL_TABLET | Freq: Every day | ORAL | Status: DC
  Administered 2019-08-20 – 2019-08-23 (×4): 10 mg via ORAL
  Filled 2019-08-20: qty 2
  Filled 2019-08-20: qty 1
  Filled 2019-08-20: qty 2
  Filled 2019-08-20: qty 1
  Filled 2019-08-20: qty 2

## 2019-08-20 MED ORDER — SODIUM CHLORIDE 0.9 % IV BOLUS
250.0000 mL | Freq: Once | INTRAVENOUS | Status: AC
  Administered 2019-08-20: 250 mL via INTRAVENOUS

## 2019-08-20 MED ORDER — ENOXAPARIN SODIUM 150 MG/ML ~~LOC~~ SOLN
1.0000 mg/kg | Freq: Two times a day (BID) | SUBCUTANEOUS | Status: DC
  Administered 2019-08-20: 130 mg via SUBCUTANEOUS
  Filled 2019-08-20 (×2): qty 0.87

## 2019-08-20 MED ORDER — FUROSEMIDE 10 MG/ML IJ SOLN
80.0000 mg | Freq: Three times a day (TID) | INTRAMUSCULAR | Status: DC
  Administered 2019-08-20 – 2019-08-23 (×9): 80 mg via INTRAVENOUS
  Filled 2019-08-20 (×9): qty 8

## 2019-08-20 MED ORDER — ENOXAPARIN SODIUM 150 MG/ML ~~LOC~~ SOLN
1.0000 mg/kg | Freq: Two times a day (BID) | SUBCUTANEOUS | Status: DC
  Filled 2019-08-20 (×3): qty 0.87

## 2019-08-20 NOTE — Progress Notes (Signed)
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  She did not have fevers or sick contacts he did have one episode of loose stools.  She lives at home with her husband. Admission chest x-ray showed cardiomegaly and bilateral interstitial infiltrates.  Oxygen saturation was 88% on room air which improved on 3 L.  She was afebrile.  Overnight oxygen requirements have increased to 8 L oxygen this morning and she was tachypneic hence transferred to the ICU and PCCM is consulted.  Admission labs significant for BNP of 3149 lymphopenia She was given Lasix and diuresed 200 cc  SIGNIFICANT PAST MEDICAL HISTORY   Chronic systolic heart failure EF 30 to 35% Hypertension, hypothyroidism, hyperlipidemia, obesity  SIGNIFICANT EVENTS:  10/26 transferred to ICU  STUDIES:   Echo 02/2018 EF 35 to 40%  CULTURES:  SARS COV-2 10/25 >> POS  ANTIBIOTICS:    LINES/TUBES:    CONSULTANTS:   SUBJECTIVE:  Required BiPAP at 90% overnight  CONSTITUTIONAL: BP 125/62   Pulse (!) 54   Temp 97.6 F (36.4 C) (Axillary)   Resp (!) 23   Ht 5\' 5"  (1.651 m)   Wt 129.4 kg   LMP 10/28/2011   SpO2 93%   BMI 47.47 kg/m   I/O last 3 completed shifts: In: 1467.7 [P.O.:680; I.V.:687.7; IV Piggyback:100] Out: 1550 [Urine:1550]  Vent Mode: BIPAP FiO2  (%):  [60 %-100 %] 90 % Set Rate:  [10 bmp-15 bmp] 10 bmp PEEP:  [10 cmH20] 10 cmH20  PHYSICAL EXAM: General: Acute on chronically ill appearing female, on BiPAP, NAD HEENT: [place]/AT, PERRL, EOM-I and MMM, BiPAP in place Heart: RRR, Nl S1/S2 and -M/R/G Lung: Diffuse crackles Abdomen: Soft, obese, NT, ND and +BS Ext: 1+ edema and -tenderness Neuro: Alert and interactive, moving all ext to command  I reviewed CXR myself, infiltrate noted  Discussed with renal and TRH-MD  RESOLVED PROBLEM LIST   ASSESSMENT AND PLAN    Acute hypoxic respiratory failure-use heated high flow nasal cannula and this seems to have decreased her work of breathing - Cycle between BiPAP and HFNC as tolerated - I am more concerned about intubation this AM, worry is if intubated will likely never come off the vent, this concern was relayed to pat - Appreciate input for renal, will continue diureses per renal - Monitor I/O - Continue decadron - Continue remdesivir  - Lovenox COVID dose - Titrate O2 for sat of 85 or higher - Monitor in the ICU given high chances of deterioration - Hold off dialysis for now  COVID-19 positive test (U07.1, COVID-19) with Acute Pneumonia (J12.89, Other viral pneumonia) - Decadron and remdesivir - Inflammatory markers per [facility] - Anticoagulation to COVID dose  Chronic systolic heart failure -Lasix 40 every 12 today and can transition to once daily. Continue Coreg and Entresto and Aldactone  AKI: Renal consult appreciated Diureses  per renal  Codes status: Short term intubation only, dialysis acceptable, no CPR/cardioversion/trach/peg  PCCM will continue to follow  SUMMARY OF TODAY'S PLAN:  She has stabilized after transfer to ICU, can [facility] to [facility] once bed available  Best Practice / Goals of Care / Disposition.   DVT PROPHYLAXIS: Lovenox SUP: Pepcid NUTRITION: Diet MOBILITY: Out of bed to chair GOALS OF CARE:N/A FAMILY DISCUSSIONS: Patient  informed DISPOSITION ICU  LABS  Glucose Recent Labs  Lab 08/18/19 1521 08/18/19 2054 08/19/19 0733 08/19/19 1144 08/19/19 1647 08/19/19 2126  GLUCAP 149* 209* 124* 142* 200* 170*    BMET Recent Labs  Lab 08/19/19 0420 08/19/19 1654 08/20/19 0320 08/20/19 0324  NA 137 139 140 142  K 5.4* 4.9 5.0 4.9  CL 107 107 109  --   CO2 19* 19* 18*  --   BUN 106* 107* 109*  --   CREATININE 2.56* 2.01* 2.27*  --   GLUCOSE 162* 217* 142*  --     Liver Enzymes Recent Labs  Lab 08/17/19 0415 08/18/19 0741 08/19/19 0420  AST 38 53* 42*  ALT 19 23 24   ALKPHOS 58 61 66  BILITOT 0.6 1.0 0.9  ALBUMIN 3.2* 3.2* 3.4*    Electrolytes Recent Labs  Lab 08/17/19 0415 08/18/19 0741 08/19/19 0420 08/19/19 1654 08/20/19 0320  CALCIUM 8.4* 8.3* 8.2* 8.4* 8.6*  MG 2.5* 2.6* 2.8*  --  3.0*  PHOS 4.1 4.8* 5.7*  --   --     CBC Recent Labs  Lab 08/18/19 0741  08/19/19 0420 08/20/19 0320 08/20/19 0324  WBC 16.0*  --  14.2* 17.0*  --   HGB 13.8   < > 14.0 13.4 13.9  HCT 43.0   < > 44.4 42.5 41.0  PLT 176  --  166 157  --    < > = values in this interval not displayed.    ABG Recent Labs  Lab 08/17/19 1412 08/17/19 1525 08/18/19 2118  PHART 7.370 7.357 7.361  PCO2ART 32.2 33.3 34.8  PO2ART 43.0* 53.0* 57.0*    Coag's Recent Labs  Lab 08/18/19 0741 08/19/19 0420 08/20/19 0320  INR 1.1 1.1 1.2    Sepsis Markers Recent Labs  Lab 08/12/2019 1033 07/30/2019 1140 08/17/2019 1702  LATICACIDVEN  --  0.8 1.1  PROCALCITON <0.10  --   --    Cardiac Enzymes No results for input(s): TROPONINI, PROBNP in the last 168 hours.  The patient is critically ill with multiple organ systems failure and requires high complexity decision making for assessment and support, frequent evaluation and titration of therapies, application of advanced monitoring technologies and extensive interpretation of multiple databases.   Critical Care Time devoted to patient care services described  in this note is  32  Minutes. This time reflects time of care of this signee Dr Jennet Maduro. This critical care time does not reflect procedure time, or teaching time or supervisory time of PA/NP/Med student/Med Resident etc but could involve care discussion time.  Rush Farmer, M.D. [facility] Pulmonary/Critical Care Medicine.

## 2019-08-20 NOTE — Progress Notes (Signed)
ANTICOAGULATION CONSULT NOTE - Follow Up Consult  Pharmacy Consult for Lovenox Indication: VTE prophylaxis (COVID-PACT trial - treatment dose arm)   No Known Allergies  Patient Measurements: Height: _0  (165.1 cm) Weight: 285 lb 4.4 oz (129.4 kg) IBW/kg (Calculated) : 57  Vital Signs: Temp: 97.5 F (36.4 C) (10/31 0900) Temp Source: Axillary (10/31 0610) BP: 149/62 (10/31 1000) Pulse Rate: 55 (10/31 1000)  Labs: Recent Labs    08/18/19 0741  08/19/19 0420 08/19/19 1654 08/19/19 2150 08/20/19 0320 08/20/19 0324  HGB 13.8   < > 14.0  --   --  13.4 13.9  HCT 43.0   < > 44.4  --   --  42.5 41.0  PLT 176  --  166  --   --  157  --   LABPROT 14.3  --  14.2  --   --  15.1  --   INR 1.1  --  1.1  --   --  1.2  --   HEPRLOWMOCWT  --   --   --   --  >2.00  --   --   CREATININE 2.42*  --  2.56* 2.01*  --  2.27*  --    < > = values in this interval not displayed.    Estimated Creatinine Clearance: 37.1 mL/min (A) (by C-G formula based on SCr of 2.27 mg/dL (H)).   Medical History: Past Medical History:  Diagnosis Date  . Anemia   . Chronic combined systolic and diastolic heart failure (HCC) 07/05/2017   Non-ischemic CM // Echo 9/18:EF 20-25, severe diff HK, Gr 1 DD, mild MR, mod LAE // R/L Heart Cath 9/18: pD1 40, mD1 30; o/w normal cors // Echo 5/19: EF 35-40, trivial AI, mildly dilated aortic root (38 mm), MAC, mild LAE   . Hyperlipidemia   . Hypertension    Pt states she "doesn't have HTN"  . Morbid obesity ([place])   . Thyroid disease hyperthyroidism    Medications:  Medications Prior to Admission  Medication Sig Dispense Refill Last Dose  . albuterol (PROVENTIL HFA;VENTOLIN HFA) 108 (90 Base) MCG/ACT inhaler Inhale 2 puffs into the lungs every 6 (six) hours as needed for wheezing or shortness of breath. 8 g 12 08/12/2019  . aspirin EC 81 MG tablet Take 81 mg by mouth daily.   07/21/2019 at Unknown time  . atorvastatin (LIPITOR) 80 MG tablet Take 1 tablet (80 mg  total) by mouth daily. 90 tablet 1 08/04/2019 at Unknown time  . carvedilol (COREG) 12.5 MG tablet Take 1 tablet (12.5 mg total) by mouth 2 (two) times daily. 180 tablet 3 08/13/2019 at 0800  . furosemide (LASIX) 20 MG tablet Take 1 tablet (20 mg total) by mouth daily as needed for fluid or edema. 90 tablet 3 08/10/2019 at Unknown time  . levothyroxine (SYNTHROID, LEVOTHROID) 125 MCG tablet Take 1 tablet (125 mcg total) by mouth daily. 90 tablet 3 07/29/2019 at Unknown time  . sacubitril-valsartan (ENTRESTO) 97-103 MG Take 1 tablet by mouth 2 (two) times daily. 60 tablet 11 08/20/2019 at Unknown time  . spironolactone (ALDACTONE) 25 MG tablet Take 1 tablet (25 mg total) by mouth daily. 90 tablet 3 08/12/2019 at Unknown time    Assessment: 66 YOF with COVID-19 associated hypoxia enrolled in treatment arm of COVID-PACT trial. Pharmacy consulted to start treatment dose Lovenox. H/H and Plt wnl. SCr up to 1.88 today. CrCl remains greater than 30 ml/min.   -Lovenox trough level >2 on 10/30 but  level was collected 6 minutes after dose given although this is still a high level since Lovenox level peaks at 4 hours. Tried ordering a repeat level but this was hemolyzed today.   Goal of Therapy:  Anti-Xa level 0.6-1 units/ml 4hrs after LMWH dose given Monitor platelets by anticoagulation protocol: Yes   Plan:  -Decrease Lovenox to 130 mg twice daily given drop in weight. Obtain a peak level after first dose.  -Monitor CBC, renal fx and s/s of bleeding   Albertina Parr, PharmD., BCPS Clinical Pharmacist Clinical phone for 08/20/19 until 5pm: [phone]

## 2019-08-20 NOTE — Progress Notes (Signed)
Bradycardia and hypotensive. HR 38-45 and sustaining . Arouses to verbal stimuli and denies s/s .Dopamine gtt adjusted as needed. Stopped Precedex gtt.  Notified Hospitalist and CCMD. EKG done. Labs completed. At bedside monitoring.

## 2019-08-20 NOTE — Progress Notes (Signed)
Assisted tele visit to patient with family member.  Brittney Ross Ann, RN  

## 2019-08-20 NOTE — Consult Note (Signed)
Renal Service Consult Note [place] [facility] [place] 08/20/2019 [place] Requesting Physician:  Dr Nelda Marseille  Reason for Consult:  Renal failure HPI: The patient is a 58 y.o. year-old w/ hx of morbid obesity, HTN, HL, and combined CHF EF 30-35% who presented on 08/06/2019 with SOB and cough x 2 days.  Works in a group home. In ED RR was up , 98% sat on 3L O2 (not on home O2) and 88% on RA.  CXR showed vasc congestion, ABG 7.42/ 37/ 50. Creat 0.7. Pt rec'd IV lasix 70m x 1, then 490mIV qid with zaroxlyn 10 on 10/27-28 w/ rise in creat up to 2.0.  Sacubitril/ valsartan was given x 4d and stopped on 10/28.  Asked to see for renal failure.     Baseline creat is 0.7- 0.9 from 2009 through June 2020  Creat here 1.06 on admit 10/25, now up to 2.0- 2.5 range over last several days.  - on dopamine gtt low dose the last 2-3 days - UOP 650- 1300 cc / day over last 6 days since admit - total I/O's are even 5.1 in / 5.1 L out  Pt on O2 by NRB mask, has required bipap intermittently . Last 2 CXR's on 10/27 and 29 show new onset of diffuse bilat severe air-space disease.   ROS  denies CP  no joint pain   no HA  no blurry vision  no rash  no diarrhea  no nausea/ vomiting   Past Medical History  Past Medical History:  Diagnosis Date  . Anemia   . Chronic combined systolic and diastolic heart failure (HCC) 07/05/2017   Non-ischemic CM // Echo 9/18:EF 20-25, severe diff HK, Gr 1 DD, mild MR, mod LAE // R/L Heart Cath 9/18: pD1 40, mD1 30; o/w normal cors // Echo 5/19: EF 35-40, trivial AI, mildly dilated aortic root (38 mm), MAC, mild LAE   . Hyperlipidemia   . Hypertension    Pt states she "doesn't have HTN"  . Morbid [place]  . Thyroid disease hyperthyroidism   Past Surgical History  Past Surgical History:  Procedure Laterality Date  . CESAREAN SECTION  12/07/79  . CESAREAN SECTION  05/17/81  . CESAREAN SECTION  09/06/89  . RATH/BSO/cystoscopy    .  RIGHT/LEFT HEART CATH AND CORONARY ANGIOGRAPHY N/A 07/09/2017   Procedure: RIGHT/LEFT HEART CATH AND CORONARY ANGIOGRAPHY;  Surgeon: KeTroy SineMD;  [facility];  Service: Cardiovascular;  Laterality: N/A;  . TUBAL LIGATION     Family History  Family History  Problem Relation Age of Onset  . Hypertension Father   . Heart disease Mother   . Hypertension Sister    Social History  reports that she has never smoked. She has never used smokeless tobacco. She reports that she does not drink alcohol or use drugs. Allergies No Known Allergies Home medications Prior to Admission medications   Medication Sig Start Date End Date Taking? Authorizing Provider  albuterol (PROVENTIL HFA;VENTOLIN HFA) 108 (90 Base) MCG/ACT inhaler Inhale 2 puffs into the lungs every 6 (six) hours as needed for wheezing or shortness of breath. 07/28/18  Yes StAzzie GlatterFNP  aspirin EC 81 MG tablet Take 81 mg by mouth daily.   Yes [provider]  atorvastatin (LIPITOR) 80 MG tablet Take 1 tablet (80 mg total) by mouth daily. 04/29/19  Yes StAzzie GlatterFNP  carvedilol (COREG) 12.5 MG tablet Take 1 tablet (  12.5 mg total) by mouth 2 (two) times daily. 12/27/18  Yes Nahser, Wonda Cheng, MD  furosemide (LASIX) 20 MG tablet Take 1 tablet (20 mg total) by mouth daily as needed for fluid or edema. 08/18/17  Yes Weaver, Scott T, PA-C  levothyroxine (SYNTHROID, LEVOTHROID) 125 MCG tablet Take 1 tablet (125 mcg total) by mouth daily. 12/10/18  Yes Philemon Kingdom, MD  sacubitril-valsartan (ENTRESTO) 97-103 MG Take 1 tablet by mouth 2 (two) times daily. 12/27/18  Yes Nahser, Wonda Cheng, MD  spironolactone (ALDACTONE) 25 MG tablet Take 1 tablet (25 mg total) by mouth daily. 03/28/19  Yes Nahser, Wonda Cheng, MD   Liver Function Tests Recent Labs  Lab 08/17/19 [phone] 08/18/19 0741 08/19/19 0420  AST 38 53* 42*  ALT _0 ALKPHOS 58 61 66  BILITOT 0.6 1.0 0.9  PROT 7.8 7.8 7.6  ALBUMIN 3.2* 3.2*  3.4*   No results for input(s): LIPASE, AMYLASE in the last 168 hours. CBC Recent Labs  Lab 08/16/2019 1140  08/16/19 1158  08/18/19 0741  08/19/19 0420 08/20/19 0320 08/20/19 0324  WBC 10.8*   < > 12.8*   < > 16.0*  --  14.2* 17.0*  --   NEUTROABS 8.8*  --  11.2*  --   --   --   --   --   --   HGB 12.9   < > 13.3   < > 13.8   < > 14.0 13.4 13.9  HCT 41.0   < > 41.6   < > 43.0   < > 44.4 42.5 41.0  MCV 90.5   < > 90.0   < > 89.4  --  89.5 90.6  --   PLT 144*   < > 154   < > 176  --  166 157  --    < > = values in this interval not displayed.   Basic Metabolic Panel Recent Labs  Lab 08/15/19 0342  08/16/19 1158  08/17/19 0415  08/17/19 1525 08/18/19 0741 08/18/19 2118 08/19/19 0420 08/19/19 1654 08/20/19 0320 08/20/19 0324  NA 134*   < > 136   < > 139   < > 137 137 138 137 139 140 142  K 3.7   < > 4.0   < > 4.9   < > 4.7 5.0 4.9 5.4* 4.9 5.0 4.9  CL 101  --  103  --  104  --   --  105  --  107 107 109  --   CO2 22  --  22  --  21*  --   --  21*  --  19* 19* 18*  --   GLUCOSE 116*  --  177*  --  157*  --   --  107*  --  162* 217* 142*  --   BUN 19  --  49*  --  70*  --   --  91*  --  106* 107* 109*  --   CREATININE 1.06*  --  1.25*  --  1.88*  --   --  2.42*  --  2.56* 2.01* 2.27*  --   CALCIUM 8.3*  --  8.2*  --  8.4*  --   --  8.3*  --  8.2* 8.4* 8.6*  --   PHOS  --   --  3.5  --  4.1  --   --  4.8*  --  5.7*  --   --   --    < > =  values in this interval not displayed.   Iron/TIBC/Ferritin/ %Sat    Component Value Date/Time   FERRITIN 1,159 (H) 08/16/2019 1230    Vitals:   08/20/19 0610 08/20/19 0700 08/20/19 0715 08/20/19 0730  BP:  (!) 139/42 (!) 136/54 125/62  Pulse: (!) 50 (!) 53 (!) 55 (!) 54  Resp: (!) 26 19 (!) 21 (!) 23  Temp: 97.6 F (36.4 C)     TempSrc: Axillary     SpO2: 94% 90% (!) 89% 93%  Weight:      Height:        Exam Gen obese pleasant AAF on O2, not in distress No rash, cyanosis or gangrene Sclera anicteric, throat clear   +JVD Chest clear soft scattered crackles, no wheezing, no ^wob RRR no MRG, distant HS Abd soft ntnd no mass or ascites +bs GU w/ purewick in place MS no joint effusions or deformity Ext no pitting LE or UE edema, no wounds or ulcers Neuro is alert, Ox 3 , nf    Home meds:  - spironolactone 25 qd/ sacubitril-valsartan 97- 103 bid/ carvedilol 12.5 bid  - aspirin 81/ atorvastatin 80 qd  - levothyroxine 125 qd  - prn's/ vitamins/ supplements     Assessment/ Plan: 1. Acute renal failure - likely due to attempted diuresis/ intravasc vol depletion + ARB effect.  Lasix and ARB dc'd.  Getting low dose dopamine for renal perfusion.  I/O since admission are even, no gross vol overload today on exam.  Cont supportive care.  If significantly worsens will need CRRT.  Will d/w CCM/ primary.  2. COVID + PNA 3. H/o CHF EF 30% 4. HL 5. HTN - home meds on hold       Kelly Splinter  MD 08/20/2019, 8:18 AM

## 2019-08-20 NOTE — Progress Notes (Signed)
Assisted tele visit to patient with daughter.  Zaydin Billey Ann, RN  

## 2019-08-20 NOTE — Progress Notes (Signed)
PROGRESS NOTE  Brittney Ross [id number] DOB: 11-06-1960 DOA: 08/12/2019  PCP: Azzie Glatter, FNP  Brief History/Interval Summary: 58 year old with a history of systolic congestive heart failure (30-35% EF), HTN, HLD, obesity, and hypothyroidism who presented with complaints of severe shortness of breath with a cough which had been worsening over approximately 2 days.  Upon arrival in the ED she was found to be hypoxic at 88% on room air.  A chest x-ray suggested only pulmonary vascular congestion.  Shortly after her admission to [facility] she developed significant increased work of breathing with respiratory rates in the 40-50s.  Her Covid test subsequently returned positive and treatment was initiated with Decadron, remdesivir, and Actemra.   Reason for Visit: Acute respiratory failure with hypoxia.  Pneumonia due to COVID-19.  Consultants: Pulmonology.  Nephrology  Procedures:   Transthoracic echocardiogram 1. Left ventricular ejection fraction, by visual estimation, is 55 to 60%. The left ventricle has normal function. Normal left ventricular size. There is mildly increased left ventricular hypertrophy.  2. Left ventricular diastolic Doppler parameters are consistent with impaired relaxation pattern of LV diastolic filling.  3. Apical window is foreshortened.  4. Global right ventricle has normal systolic function.The right ventricular size is normal. No increase in right ventricular wall thickness.  5. Left atrial size was normal.  6. Right atrial size was normal.  7. Mild to moderate mitral annular calcification.  8. The mitral valve is grossly normal. Trace mitral valve regurgitation.  9. The tricuspid valve is normal in structure. Tricuspid valve regurgitation is trivial. 10. The aortic valve is tricuspid Aortic valve regurgitation was not visualized by color flow Doppler. Mild to moderate aortic valve sclerosis/calcification without any evidence of aortic stenosis. 11.  The pulmonic valve was not well visualized. Pulmonic valve regurgitation is not visualized by color flow Doppler. 12. The left ventricular function has improved.   Antibiotics: Anti-infectives (From admission, onward)   Start     Dose/Rate Route Frequency Ordered Stop   08/16/19 1000  remdesivir 100 mg in sodium chloride 0.9 % 250 mL IVPB     100 mg 500 mL/hr over 30 Minutes Intravenous Every 24 hours 08/15/19 1004 08/19/19 1141   08/15/19 1100  remdesivir 200 mg in sodium chloride 0.9 % 250 mL IVPB     200 mg 500 mL/hr over 30 Minutes Intravenous Once 08/15/19 1004 08/15/19 1200      Subjective/Interval History: Patient noted to be on BiPAP this morning.  States that she is feeling better.  Denies any chest pain .  No nausea or vomiting   Assessment/Plan:  Acute Hypoxic Resp. Failure/Pneumonia due to COVID-19  Vent Mode: BIPAP FiO2 (%):  [60 %-100 %] 90 % Set Rate:  [10 bmp-15 bmp] 10 bmp PEEP:  [10 cmH20] 10 cmH20 Pressure Support:  [12 cmH20] 12 cmH20     Component Value Date/Time   PHART 7.361 08/18/2019 2118   PCO2ART 34.8 08/18/2019 2118   PO2ART 57.0 (L) 08/18/2019 2118   HCO3 21.2 08/20/2019 0324   TCO2 22 08/20/2019 0324   ACIDBASEDEF 5.0 (H) 08/20/2019 0324   O2SAT 53.0 08/20/2019 0324    COVID-19 Labs  Recent Labs    08/18/19 0741 08/19/19 0420 08/20/19 0320  DDIMER 0.85* 1.37* 3.06*  CRP 8.3* 6.0* 3.8*    Lab Results  Component Value Date   SARSCOV2NAA POSITIVE (A) 08/10/2019    Fever: Remains afebrile Oxygen requirements: Requiring BiPAP for the most part.  High flow nasal cannula at times.  Nonrebreather at times.  Saturating in the early 90s.   Antibacterials: None Remdesivir: Completed course on 10/30 Steroids: Dexamethasone 6 mg daily Diuretics: Lasix was being given based on volume status.  Held yesterday due to elevated creatinine. Actemra: 1 dose given on 10/26 Convalescent plasma: Transfused on 10/28 DVT Prophylaxis: On Lovenox as  part of the Covid pact trial   Research Studies: COVID PACT Trial  Patient's respiratory status remains tenuous.  Pulmonology continues to follow.  She is requiring BiPAP alternating with nonrebreather and high flow nasal cannula.  We will repeat chest x-ray.  Leukocytosis is most likely due to steroids.  From a COVID-19 standpoint she has completed course of remdesivir.  She remains on steroids.  She also received Actemra and convalescent plasma.  Inflammatory markers have improved with CRP down to 3.8.  D-dimer noted to be slightly higher today at 3.06.  She has been enrolled into the anticoagulation trial as mentioned above.  Due to her morbid obesity she remains at high risk for decompensation.  Acute on chronic systolic CHF/concern for acute diastolic CHF Based on echocardiogram from 2019 her EF was 35 to 40%.  Echocardiogram done during this hospitalization shows improvement in EF to 55%.  At the time of admission BNP was elevated at 314.  Patient was getting diuretics.  Entresto and carvedilol were stopped due to bradycardia and renal failure.    Sinus bradycardia Had another episode of bradycardia yesterday.  EKG shows sinus bradycardia.  Most likely due to medication effect.  TSH was 1.46 on 10/25.  Patient requiring dopamine infusion on and off.  Her beta-blockers were discontinued as well.  She was on carvedilol.  Acute kidney injury/hyperkalemia/normal anion gap metabolic acidosis Baseline creatinine around 1.7.  Rising BUN and creatinine initially thought to be due to diuretics.  Diuretics were held.  Creatinine noted to be higher today.  She is making urine.  Nephrology is following.  Plan is to diurese her today.  Potassium is better.     Morbid obesity Estimated body mass index is 47.47 kg/m as calculated from the following:   Height as of this encounter: 5\' 5"  (1.651 m).   Weight as of this encounter: 129.4 kg.  Hyperlipidemia Continue statin.  Hypothyroidism Continue  Synthroid.  Diabetes mellitus type 2, uncontrolled with hyperglycemia HbA1c 7.0.  CBGs appear to be reasonably well controlled.  Continue Lantus and SSI.  Essential hypertension Continue to monitor blood pressures.  FEN Not on any IV fluids.  Monitor electrolytes.     DVT Prophylaxis:  COVID PACT Trial PUD Prophylaxis: Continue Protonix Code Status: Full code Family Communication: Son was updated yesterday. Disposition Plan: Remain in ICU for now   Medications:  Scheduled: . atropine      . Chlorhexidine Gluconate Cloth  6 each Topical Daily  . dexamethasone (DECADRON) injection  6 mg Intravenous Q24H  . docusate sodium  100 mg Oral BID  . enoxaparin (LOVENOX) injection  1 mg/kg Subcutaneous Q12H  . feeding supplement (NEPRO CARB STEADY)  237 mL Oral TID BM  . feeding supplement (PRO-STAT SUGAR FREE 64)  30 mL Oral TID WC  . insulin aspart  0-9 Units Subcutaneous TID WC  . insulin glargine  12 Units Subcutaneous QHS  . mouth rinse  15 mL Mouth Rinse BID  . polyethylene glycol  17 g Oral Daily   Continuous: . dexmedetomidine (PRECEDEX) IV infusion 0.2 mcg/kg/hr (08/20/19 0732)  . DOPamine 4 mcg/kg/min (08/20/19 0331)  . famotidine (PEPCID) IV Stopped (  08/19/19 2213)   [id number], albuterol, haloperidol lactate, lip balm, oxyCODONE, sodium chloride, sodium chloride flush, traMADol   Objective:  Vital Signs  Vitals:   08/20/19 0610 08/20/19 0700 08/20/19 0715 08/20/19 0730  BP:  (!) 139/42 (!) 136/54 125/62  Pulse: (!) 50 (!) 53 (!) 55 (!) 54  Resp: (!) 26 19 (!) 21 (!) 23  Temp: 97.6 F (36.4 C)     TempSrc: Axillary     SpO2: 94% 90% (!) 89% 93%  Weight:      Height:        Intake/Output Summary (Last 24 hours) at 08/20/2019 0823 Last data filed at 08/20/2019 0600 Gross per 24 hour  Intake 1181.55 ml  Output 1000 ml  Net 181.55 ml   Filed Weights   08/18/19 0500 08/19/19 0500 08/20/19 0500  Weight: (!) 142.1 kg 129.2 kg 129.4 kg     General  appearance: Awake alert.  In no distress.  Morbidly obese Resp: Continues to be tachypneic.  No use of accessory muscles.  However seems to be a little bit more comfortable today compared to yesterday.  Continues to have crackles at the bases.  No wheezing or rhonchi.   Cardio: S1-S2 is normal regular.  No S3-S4.  No rubs murmurs or bruit GI: Abdomen is soft.  Nontender nondistended.  Bowel sounds are present normal.  No masses organomegaly Extremities: Some edema in the lower extremities.  Good range of motion.   Neurologic: Alert and oriented x3.  No focal neurological deficits.       Lab Results:  Data Reviewed: I have personally reviewed following labs and imaging studies  CBC: Recent Labs  Lab 08/17/2019 1140  08/16/19 1158  08/17/19 0415  08/18/19 0741 08/18/19 2118 08/19/19 0420 08/20/19 0320 08/20/19 0324  WBC 10.8*   < > 12.8*  --  15.3*  --  16.0*  --  14.2* 17.0*  --   NEUTROABS 8.8*  --  11.2*  --   --   --   --   --   --   --   --   HGB 12.9   < > 13.3   < > 13.2   < > 13.8 13.9 14.0 13.4 13.9  HCT 41.0   < > 41.6   < > 41.4   < > 43.0 41.0 44.4 42.5 41.0  MCV 90.5   < > 90.0  --  89.2  --  89.4  --  89.5 90.6  --   PLT 144*   < > 154  --  164  --  176  --  166 157  --    < > = values in this interval not displayed.    Basic Metabolic Panel: Recent Labs  Lab 08/16/19 1158  08/17/19 0415  08/18/19 0741 08/18/19 2118 08/19/19 0420 08/19/19 1654 08/20/19 0320 08/20/19 0324  NA 136   < > 139   < > 137 138 137 139 140 142  K 4.0   < > 4.9   < > 5.0 4.9 5.4* 4.9 5.0 4.9  CL 103  --  104  --  105  --  107 107 109  --   CO2 22  --  21*  --  21*  --  19* 19* 18*  --   GLUCOSE 177*  --  157*  --  107*  --  162* 217* 142*  --   BUN 49*  --  70*  --  91*  --  106* 107* 109*  --   CREATININE 1.25*  --  1.88*  --  2.42*  --  2.56* 2.01* 2.27*  --   CALCIUM 8.2*  --  8.4*  --  8.3*  --  8.2* 8.4* 8.6*  --   MG 2.5*  --  2.5*  --  2.6*  --  2.8*  --  3.0*  --   PHOS  3.5  --  4.1  --  4.8*  --  5.7*  --   --   --    < > = values in this interval not displayed.    GFR: Estimated Creatinine Clearance: 37.1 mL/min (A) (by C-G formula based on SCr of 2.27 mg/dL (H)).  Liver Function Tests: Recent Labs  Lab 08/15/19 0342 08/16/19 1158 08/17/19 0415 08/18/19 0741 08/19/19 0420  AST 16 31 38 53* 42*  ALT 15 19 19 23 24   ALKPHOS 56 54 58 61 66  BILITOT 1.3* 0.9 0.6 1.0 0.9  PROT 7.8 7.7 7.8 7.8 7.6  ALBUMIN 3.2* 3.1* 3.2* 3.2* 3.4*    HbA1C: No results for input(s): HGBA1C in the last 72 hours.  CBG: Recent Labs  Lab 08/18/19 2054 08/19/19 0733 08/19/19 1144 08/19/19 1647 08/19/19 2126  GLUCAP 209* 124* 142* 200* 170*      Recent Results (from the past 240 hour(s))  SARS CORONAVIRUS 2 (TAT 6-24 HRS) Nasopharyngeal Nasopharyngeal Swab     Status: Abnormal   Collection Time: 08/05/2019  1:06 PM   Specimen: Nasopharyngeal Swab  Result Value Ref Range Status   SARS Coronavirus 2 POSITIVE (A) NEGATIVE Final    Comment: RESULT CALLED TO, READ BACK BY AND VERIFIED WITH: Reita Chard, RN AT [phone] ON 08/15/2019 BY SAINVILUS S (NOTE) SARS-CoV-2 target nucleic acids are DETECTED. The SARS-CoV-2 RNA is generally detectable in upper and lower respiratory specimens during the acute phase of infection. Positive results are indicative of active infection with SARS-CoV-2. Clinical  correlation with patient history and other diagnostic information is necessary to determine patient infection status. Positive results do  not rule out bacterial infection or co-infection with other viruses. The expected result is Negative. Fact Sheet for Patients: SugarRoll.be Fact Sheet for Healthcare Providers: https://www.woods-mathews.com/ This test is not yet approved or cleared by the [place] FDA and  has been authorized for detection and/or diagnosis of SARS-CoV-2 by FDA under an Emergency Use Authorization (EUA).  This EUA will remain  in effect (meaning this test  can be used) for the duration of the COVID-19 declaration under Section 564(b)(1) of the Act, 21 U.S.C. section 360bbb-3(b)(1), unless the authorization is terminated or revoked sooner. Performed at [facility], [place] [address], [place], [place] [address]   MRSA PCR Screening     Status: None   Collection Time: 08/15/19 10:59 AM   Specimen: Nasal Mucosa; Nasopharyngeal  Result Value Ref Range Status   MRSA by PCR NEGATIVE NEGATIVE Final    Comment:        The GeneXpert MRSA Assay (FDA approved for NASAL specimens only), is one component of a comprehensive MRSA colonization surveillance program. It is not intended to diagnose MRSA infection nor to guide or monitor treatment for MRSA infections. Performed at [facility], [place] [address], [place],  [address]       Radiology Studies: No results found.     LOS: 6 days   Jeniyah Menor [employer] on [email or web address]  08/20/2019, 8:23 AM

## 2019-08-21 ENCOUNTER — Inpatient Hospital Stay (HOSPITAL_COMMUNITY): Payer: BLUE CROSS/BLUE SHIELD

## 2019-08-21 DIAGNOSIS — I4891 Unspecified atrial fibrillation: Secondary | ICD-10-CM

## 2019-08-21 DIAGNOSIS — D72829 Elevated white blood cell count, unspecified: Secondary | ICD-10-CM

## 2019-08-21 LAB — GLUCOSE, CAPILLARY
Glucose-Capillary: 153 mg/dL — ABNORMAL HIGH (ref 70–99)
Glucose-Capillary: 119 mg/dL — ABNORMAL HIGH (ref 70–99)
Glucose-Capillary: 151 mg/dL — ABNORMAL HIGH (ref 70–99)
Glucose-Capillary: 106 mg/dL — ABNORMAL HIGH (ref 70–99)

## 2019-08-21 LAB — MAGNESIUM: Magnesium: 2.9 mg/dL — ABNORMAL HIGH (ref 1.7–2.4)

## 2019-08-21 LAB — RENAL FUNCTION PANEL
Albumin: 3.5 g/dL (ref 3.5–5.0)
Anion gap: 15 (ref 5–15)
BUN: 107 mg/dL — ABNORMAL HIGH (ref 6–20)
CO2: 18 mmol/L — ABNORMAL LOW (ref 22–32)
Calcium: 8.8 mg/dL — ABNORMAL LOW (ref 8.9–10.3)
Chloride: 110 mmol/L (ref 98–111)
Creatinine, Ser: 1.88 mg/dL — ABNORMAL HIGH (ref 0.44–1.00)
GFR calc Af Amer: 34 mL/min — ABNORMAL LOW (ref >60)
GFR calc non Af Amer: 29 mL/min — ABNORMAL LOW (ref >60)
Glucose, Bld: 89 mg/dL (ref 70–99)
Phosphorus: 5.7 mg/dL — ABNORMAL HIGH (ref 2.5–4.6)
Potassium: 4.2 mmol/L (ref 3.5–5.1)
Sodium: 143 mmol/L (ref 135–145)

## 2019-08-21 LAB — D-DIMER, QUANTITATIVE: D-Dimer, Quant: 3.19 ug/mL-FEU — ABNORMAL HIGH (ref 0.00–0.50)

## 2019-08-21 LAB — POCT I-STAT 7, (LYTES, BLD GAS, ICA,H+H)
Acid-base deficit: 6 mmol/L — ABNORMAL HIGH (ref 0.0–2.0)
Bicarbonate: 17.9 mmol/L — ABNORMAL LOW (ref 20.0–28.0)
Calcium, Ion: 1.17 mmol/L (ref 1.15–1.40)
HCT: 45 % (ref 36.0–46.0)
Hemoglobin: 15.3 g/dL — ABNORMAL HIGH (ref 12.0–15.0)
O2 Saturation: 87 %
Patient temperature: 98.6
Potassium: 4.2 mmol/L (ref 3.5–5.1)
Sodium: 142 mmol/L (ref 135–145)
TCO2: 19 mmol/L — ABNORMAL LOW (ref 22–32)
pCO2 arterial: 31.3 mmHg — ABNORMAL LOW (ref 32.0–48.0)
pH, Arterial: 7.366 (ref 7.350–7.450)
pO2, Arterial: 54 mmHg — ABNORMAL LOW (ref 83.0–108.0)

## 2019-08-21 LAB — PROTIME-INR
INR: 1.2 (ref 0.8–1.2)
Prothrombin Time: 15.2 seconds (ref 11.4–15.2)

## 2019-08-21 LAB — CBC
HCT: 45.2 % (ref 36.0–46.0)
Hemoglobin: 14.5 g/dL (ref 12.0–15.0)
MCH: 28.3 pg (ref 26.0–34.0)
MCHC: 32.1 g/dL (ref 30.0–36.0)
MCV: 88.1 fL (ref 80.0–100.0)
Platelets: 161 10*3/uL (ref 150–400)
RBC: 5.13 MIL/uL — ABNORMAL HIGH (ref 3.87–5.11)
RDW: 14.7 % (ref 11.5–15.5)
WBC: 18.9 10*3/uL — ABNORMAL HIGH (ref 4.0–10.5)
nRBC: 0 % (ref 0.0–0.2)

## 2019-08-21 LAB — HEPARIN ANTI-XA: Heparin LMW: >2 IU/mL

## 2019-08-21 LAB — C-REACTIVE PROTEIN: CRP: 2.2 mg/dL — ABNORMAL HIGH (ref <1.0)

## 2019-08-21 LAB — FIBRINOGEN: Fibrinogen: 497 mg/dL — ABNORMAL HIGH (ref 210–475)

## 2019-08-21 MED ORDER — AMIODARONE LOAD VIA INFUSION
150.0000 mg | Freq: Once | INTRAVENOUS | Status: AC
  Administered 2019-08-21: 150 mg via INTRAVENOUS
  Filled 2019-08-21: qty 83.34

## 2019-08-21 MED ORDER — HEPARIN (PORCINE) 25000 UT/250ML-% IV SOLN
1200.0000 [IU]/h | INTRAVENOUS | Status: DC
  Filled 2019-08-21: qty 250

## 2019-08-21 MED ORDER — ENOXAPARIN SODIUM 80 MG/0.8ML ~~LOC~~ SOLN
80.0000 mg | Freq: Two times a day (BID) | SUBCUTANEOUS | Status: DC
  Administered 2019-08-21 – 2019-08-22 (×2): 80 mg via SUBCUTANEOUS
  Filled 2019-08-21 (×2): qty 0.8

## 2019-08-21 MED ORDER — AMIODARONE HCL IN DEXTROSE 360-4.14 MG/200ML-% IV SOLN
30.0000 mg/h | INTRAVENOUS | Status: DC
  Administered 2019-08-21 – 2019-08-22 (×2): 30 mg/h via INTRAVENOUS
  Filled 2019-08-21: qty 200

## 2019-08-21 MED ORDER — AMIODARONE HCL IN DEXTROSE 360-4.14 MG/200ML-% IV SOLN
60.0000 mg/h | INTRAVENOUS | Status: AC
  Administered 2019-08-21 (×2): 60 mg/h via INTRAVENOUS
  Filled 2019-08-21 (×2): qty 200

## 2019-08-21 MED ORDER — ENOXAPARIN SODIUM 80 MG/0.8ML ~~LOC~~ SOLN
80.0000 mg | Freq: Two times a day (BID) | SUBCUTANEOUS | Status: DC
  Administered 2019-08-21: 80 mg via SUBCUTANEOUS
  Filled 2019-08-21: qty 0.8

## 2019-08-21 NOTE — Progress Notes (Signed)
Informed PCCM MD about HR still in 120's which had improved from 160's in the beginning of the shift.  But patients RR over last few hours sustaining in the 30's.  We gave haldol 3mg  prn earlier which had her QTC interval increase to 512 for a short period of time.  Informed PCCM will pass on to first shift MD to look at as well.

## 2019-08-21 NOTE — Progress Notes (Signed)
ANTICOAGULATION CONSULT NOTE - Follow Up Consult  Pharmacy Consult for Lovenox Indication: atrial fibrillation   No Known Allergies  Patient Measurements: Height: _0  (165.1 cm) Weight: (!) 303 lb 12.7 oz (137.8 kg) IBW/kg (Calculated) : 57  Heparin dosing weight: 90 kg   Vital Signs: Temp: 98.1 F (36.7 C) (11/01 1200) Temp Source: Oral (11/01 1200) BP: 90/61 (11/01 1400) Pulse Rate: 69 (11/01 1400)  Labs: Recent Labs    08/19/19 0420 08/19/19 1654 08/19/19 2150 08/20/19 0320 08/20/19 0324 08/21/19 0300 08/21/19 0813  HGB 14.0  --   --  13.4 13.9 14.5 15.3*  HCT 44.4  --   --  42.5 41.0 45.2 45.0  PLT 166  --   --  157  --  161  --   LABPROT 14.2  --   --  15.1  --  15.2  --   INR 1.1  --   --  1.2  --  1.2  --   HEPRLOWMOCWT  --   --  >2.00  --   --  >2.00  --   CREATININE 2.56* 2.01*  --  2.27*  --  1.88*  --     Estimated Creatinine Clearance: 46.5 mL/min (A) (by C-G formula based on SCr of 1.88 mg/dL (H)).   Medical History: Past Medical History:  Diagnosis Date  . Anemia   . Chronic combined systolic and diastolic heart failure (HCC) 07/05/2017   Non-ischemic CM // Echo 9/18:EF 20-25, severe diff HK, Gr 1 DD, mild MR, mod LAE // R/L Heart Cath 9/18: pD1 40, mD1 30; o/w normal cors // Echo 5/19: EF 35-40, trivial AI, mildly dilated aortic root (38 mm), MAC, mild LAE   . Hyperlipidemia   . Hypertension    Pt states she "doesn't have HTN"  . Morbid obesity ([place])   . Thyroid disease hyperthyroidism    Medications:  Medications Prior to Admission  Medication Sig Dispense Refill Last Dose  . albuterol (PROVENTIL HFA;VENTOLIN HFA) 108 (90 Base) MCG/ACT inhaler Inhale 2 puffs into the lungs every 6 (six) hours as needed for wheezing or shortness of breath. 8 g 12 08/12/2019  . aspirin EC 81 MG tablet Take 81 mg by mouth daily.   07/29/2019 at Unknown time  . atorvastatin (LIPITOR) 80 MG tablet Take 1 tablet (80 mg total) by mouth daily. 90 tablet 1  08/01/2019 at Unknown time  . carvedilol (COREG) 12.5 MG tablet Take 1 tablet (12.5 mg total) by mouth 2 (two) times daily. 180 tablet 3 07/26/2019 at 0800  . furosemide (LASIX) 20 MG tablet Take 1 tablet (20 mg total) by mouth daily as needed for fluid or edema. 90 tablet 3 08/02/2019 at Unknown time  . levothyroxine (SYNTHROID, LEVOTHROID) 125 MCG tablet Take 1 tablet (125 mcg total) by mouth daily. 90 tablet 3 08/18/2019 at Unknown time  . sacubitril-valsartan (ENTRESTO) 97-103 MG Take 1 tablet by mouth 2 (two) times daily. 60 tablet 11 07/25/2019 at Unknown time  . spironolactone (ALDACTONE) 25 MG tablet Take 1 tablet (25 mg total) by mouth daily. 90 tablet 3 08/05/2019 at Unknown time    Assessment: 53 YOF with COVID-19 associated hypoxia enrolled in treatment arm of COVID-PACT trial on Lovenox treatment arm. Given new AFib, pharmacy consulted to start IV heparin.   Patient was on SQ Lovenox today as part of COVID-PACT trial for VTE prophylaxis. This was changed to IV heparin for treatment of Afib but given difficulty with obtaining IV line access,  now changing to SQ Lovenox for treatment   Given supratherapeutic Lovenox this AM, will decrease dose by 40%   Goal of Therapy:  Anti-Xa level 0.6-1 units/ml 4hrs after LMWH dose given Monitor platelets by anticoagulation protocol: Yes   Plan:  -Start Lovenox 80 mg twice daily given supratherapeutic lovenox peak. Will plan to recheck peak  -Monitor CBC, renal fx and s/s of bleeding   Albertina Parr, PharmD., BCPS Clinical Pharmacist Clinical phone for 08/21/19 until 5pm: [phone]

## 2019-08-21 NOTE — Progress Notes (Signed)
ANTICOAGULATION CONSULT NOTE - Follow Up Consult  Pharmacy Consult for Lovenox Indication: VTE prophylaxis (COVID-PACT trial - treatment dose arm)   No Known Allergies  Patient Measurements: Height: _0  (165.1 cm) Weight: (!) 303 lb 12.7 oz (137.8 kg) IBW/kg (Calculated) : 57  Vital Signs: Temp: 97.7 F (36.5 C) (11/01 0400) Temp Source: Axillary (11/01 0400) BP: 119/79 (11/01 0700) Pulse Rate: 78 (11/01 0700)  Labs: Recent Labs    08/19/19 0420 08/19/19 1654 08/19/19 2150 08/20/19 0320 08/20/19 0324 08/21/19 0300  HGB 14.0  --   --  13.4 13.9 14.5  HCT 44.4  --   --  42.5 41.0 45.2  PLT 166  --   --  157  --  161  LABPROT 14.2  --   --  15.1  --  15.2  INR 1.1  --   --  1.2  --  1.2  HEPRLOWMOCWT  --   --  >2.00  --   --  >2.00  CREATININE 2.56* 2.01*  --  2.27*  --  1.88*    Estimated Creatinine Clearance: 46.5 mL/min (A) (by C-G formula based on SCr of 1.88 mg/dL (H)).   Medical History: Past Medical History:  Diagnosis Date  . Anemia   . Chronic combined systolic and diastolic heart failure (HCC) 07/05/2017   Non-ischemic CM // Echo 9/18:EF 20-25, severe diff HK, Gr 1 DD, mild MR, mod LAE // R/L Heart Cath 9/18: pD1 40, mD1 30; o/w normal cors // Echo 5/19: EF 35-40, trivial AI, mildly dilated aortic root (38 mm), MAC, mild LAE   . Hyperlipidemia   . Hypertension    Pt states she "doesn't have HTN"  . Morbid obesity ([place])   . Thyroid disease hyperthyroidism    Medications:  Medications Prior to Admission  Medication Sig Dispense Refill Last Dose  . albuterol (PROVENTIL HFA;VENTOLIN HFA) 108 (90 Base) MCG/ACT inhaler Inhale 2 puffs into the lungs every 6 (six) hours as needed for wheezing or shortness of breath. 8 g 12 08/12/2019  . aspirin EC 81 MG tablet Take 81 mg by mouth daily.   08/17/2019 at Unknown time  . atorvastatin (LIPITOR) 80 MG tablet Take 1 tablet (80 mg total) by mouth daily. 90 tablet 1 08/19/2019 at Unknown time  . carvedilol (COREG)  12.5 MG tablet Take 1 tablet (12.5 mg total) by mouth 2 (two) times daily. 180 tablet 3 08/12/2019 at 0800  . furosemide (LASIX) 20 MG tablet Take 1 tablet (20 mg total) by mouth daily as needed for fluid or edema. 90 tablet 3 08/20/2019 at Unknown time  . levothyroxine (SYNTHROID, LEVOTHROID) 125 MCG tablet Take 1 tablet (125 mcg total) by mouth daily. 90 tablet 3 07/26/2019 at Unknown time  . sacubitril-valsartan (ENTRESTO) 97-103 MG Take 1 tablet by mouth 2 (two) times daily. 60 tablet 11 08/05/2019 at Unknown time  . spironolactone (ALDACTONE) 25 MG tablet Take 1 tablet (25 mg total) by mouth daily. 90 tablet 3 07/31/2019 at Unknown time    Assessment: 61 YOF with COVID-19 associated hypoxia enrolled in treatment arm of COVID-PACT trial. Pharmacy consulted to start treatment dose Lovenox. H/H and Plt wnl. SCr up to 1.88 today. CrCl remains greater than 30 ml/min.   -Lovenox peak level is supratherapeutic at >2 on 11/1 on Lovenox 130 mg twice daily. Of note, patient's urine output seems to be picking up. SCr has improved to 1.88 today. CrCl remains > 30 ml/min.   Goal of Therapy:  Anti-Xa  level 0.6-1 units/ml 4hrs after LMWH dose given Monitor platelets by anticoagulation protocol: Yes   Plan:  -Decrease Lovenox dose by 40% to 80 mg twice daily given supratherapeutic lovenox peak. Will plan to recheck peak  -Monitor CBC, renal fx and s/s of bleeding   Albertina Parr, PharmD., BCPS Clinical Pharmacist Clinical phone for 08/21/19 until 5pm: [phone]

## 2019-08-21 NOTE — Plan of Care (Signed)
Discussed with patient plan of care for the evening, pain management and blood pressures being hypotensive with some teach back displayed.  Patient's mouth was very dry and mouth care preformed.  Contacted Hospitalist and PCCM for several interventions of care from 2000 to 2300; unable to update the family at this time will try in the morning.

## 2019-08-21 NOTE — Progress Notes (Signed)
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  She did not have fevers or sick contacts he did have one episode of loose stools.  She lives at home with her husband. Admission chest x-ray showed cardiomegaly and bilateral interstitial infiltrates.  Oxygen saturation was 88% on room air which improved on 3 L.  She was afebrile.  Overnight oxygen requirements have increased to 8 L oxygen this morning and she was tachypneic hence transferred to the ICU and PCCM is consulted.  Admission labs significant for BNP of 3149 lymphopenia She was given Lasix and diuresed 200 cc  SIGNIFICANT PAST MEDICAL HISTORY   Chronic systolic heart failure EF 30 to 35% Hypertension, hypothyroidism, hyperlipidemia, obesity  SIGNIFICANT EVENTS:  10/26 transferred to ICU  STUDIES:   Echo 02/2018 EF 35 to 40%  CULTURES:  SARS COV-2 10/25 >> POS  ANTIBIOTICS:    LINES/TUBES:    CONSULTANTS:   SUBJECTIVE:  Multiple episodes of confusion, removing her BiPAP then episodes of bradycardia and hypotension  CONSTITUTIONAL: BP 135/75 (BP Location: Left Arm)   Pulse (!) 46   Temp 98.8 F (37.1 C) (Oral)   Resp (!) 26   Ht 5\' 5"  (1.651 m)   Wt (!) 137.8 kg   LMP 10/28/2011   SpO2 (!) 87% Comment: per abg results  BMI 50.55 kg/m   I/O last 3 completed  shifts: In: 2363.1 [P.O.:950; I.V.:565.8; IV Piggyback:847.3] Out: 2950 [Urine:2950]  Vent Mode: BIPAP FiO2 (%):  [100 %] 100 % Set Rate:  [10 bmp] 10 bmp  PHYSICAL EXAM: General: Acute on chronically ill appearing female, NAD on BiPAP HEENT: [place]/AT, PERRL,EOM-I and MMM, BiPAP in place Heart: RRR, Nl S1/S2 and -M/R/G Lung: Coarse bilaterally Abdomen: Soft, NT, ND and +BS Ext: 1+ edema bilaterally Neuro: Alert and interactive, moving all ext to command  I reviewed CXR myself, small volume, diffuse infiltrate, bilaterally white  Discussed with TRH-MD  RESOLVED PROBLEM LIST   ASSESSMENT AND PLAN    Acute hypoxic respiratory failure-use heated high flow nasal cannula and this seems to have decreased her work of breathing - Cycle between BiPAP and NRB - If has another episode of brady and removal of BiPAP I am afraid will have no option but intubate, the concern is that with intubation patient is very likely to extubate given her body habitus and chronic medical conditions and since she is refusing tracheostomy an intubation maybe a terminal process - Diureses per renal, no HD for now - Monitor I/O - Continue decadron - Continue remdesivir  - Lovenox COVID dose - Titrate O2 for sat of 85 or higher - Monitor in the ICU given high chances of deterioration - Hold off dialysis for now  COVID-19 positive test (U07.1, COVID-19) with Acute Pneumonia (J12.89, Other viral pneumonia) - Decadron and remdesivir - Inflammatory markers per [facility]ad - Anticoagulation to  COVID dose  Chronic systolic heart failure -Lasix 40 every 12 today and can transition to once daily. Continue Coreg and Entresto and Aldactone  AKI: Renal consult appreciated Diureses per renal  Codes status: Short term intubation only, dialysis acceptable, no CPR/cardioversion/trach/peg  PCCM will continue to follow  SUMMARY OF TODAY'S PLAN:  She has stabilized after transfer to ICU, can transfer to [facility] once  bed available  [employer] / Goals of Care / Disposition.   DVT PROPHYLAXIS: Lovenox SUP: Pepcid NUTRITION: Diet MOBILITY: Out of bed to chair GOALS OF CARE:N/A FAMILY DISCUSSIONS: Patient informed DISPOSITION ICU  LABS  Glucose Recent Labs  Lab 08/19/19 2126 08/20/19 0930 08/20/19 1216 08/20/19 1621 08/20/19 2302 08/21/19 0812  GLUCAP 170* 104* 138* 235* 118* 153*    BMET Recent Labs  Lab 08/19/19 1654 08/20/19 0320 08/20/19 0324 08/21/19 0300  NA 139 140 142 143  K 4.9 5.0 4.9 4.2  CL 107 109  --  110  CO2 19* 18*  --  18*  BUN 107* 109*  --  107*  CREATININE 2.01* 2.27*  --  1.88*  GLUCOSE 217* 142*  --  89    Liver Enzymes Recent Labs  Lab 08/17/19 0415 08/18/19 0741 08/19/19 0420 08/21/19 0300  AST 38 53* 42*  --   ALT 19 23 24   --   ALKPHOS 58 61 66  --   BILITOT 0.6 1.0 0.9  --   ALBUMIN 3.2* 3.2* 3.4* 3.5    Electrolytes Recent Labs  Lab 08/18/19 0741 08/19/19 0420 08/19/19 1654 08/20/19 0320 08/21/19 0300  CALCIUM 8.3* 8.2* 8.4* 8.6* 8.8*  MG 2.6* 2.8*  --  3.0* 2.9*  PHOS 4.8* 5.7*  --   --  5.7*    CBC Recent Labs  Lab 08/19/19 0420 08/20/19 0320 08/20/19 0324 08/21/19 0300  WBC 14.2* 17.0*  --  18.9*  HGB 14.0 13.4 13.9 14.5  HCT 44.4 42.5 41.0 45.2  PLT 166 157  --  161    ABG Recent Labs  Lab 08/17/19 1412 08/17/19 1525 08/18/19 2118  PHART 7.370 7.357 7.361  PCO2ART 32.2 33.3 34.8  PO2ART 43.0* 53.0* 57.0*    Coag's Recent Labs  Lab 08/19/19 0420 08/20/19 0320 08/21/19 0300  INR 1.1 1.2 1.2    Sepsis Markers Recent Labs  Lab 08/19/2019 1033 08/13/2019 1140 07/28/2019 1702  LATICACIDVEN  --  0.8 1.1  PROCALCITON <0.10  --   --    Cardiac Enzymes No results for input(s): TROPONINI, PROBNP in the last 168 hours.  The patient is critically ill with multiple organ systems failure and requires high complexity decision making for assessment and support, frequent evaluation and titration of therapies,  application of advanced monitoring technologies and extensive interpretation of multiple databases.   Critical Care Time devoted to patient care services described in this note is  36  Minutes. This time reflects time of care of this signee Dr Jennet Maduro. This critical care time does not reflect procedure time, or teaching time or supervisory time of PA/NP/Med student/Med Resident etc but could involve care discussion time.  Rush Farmer, M.D. [facility] Pulmonary/Critical Care Medicine.

## 2019-08-21 NOTE — Progress Notes (Signed)
ANTICOAGULATION CONSULT NOTE - Follow Up Consult  Pharmacy Consult for IV heparin Indication: atrial fibrillation   No Known Allergies  Patient Measurements: Height: _0  (165.1 cm) Weight: (!) 303 lb 12.7 oz (137.8 kg) IBW/kg (Calculated) : 57  Heparin dosing weight: 90 kg   Vital Signs: Temp: 98.8 F (37.1 C) (11/01 0800) Temp Source: Oral (11/01 0800) BP: 132/90 (11/01 1000) Pulse Rate: 121 (11/01 0937)  Labs: Recent Labs    08/19/19 0420 08/19/19 1654 08/19/19 2150 08/20/19 0320 08/20/19 0324 08/21/19 0300 08/21/19 0813  HGB 14.0  --   --  13.4 13.9 14.5 15.3*  HCT 44.4  --   --  42.5 41.0 45.2 45.0  PLT 166  --   --  157  --  161  --   LABPROT 14.2  --   --  15.1  --  15.2  --   INR 1.1  --   --  1.2  --  1.2  --   HEPRLOWMOCWT  --   --  >2.00  --   --  >2.00  --   CREATININE 2.56* 2.01*  --  2.27*  --  1.88*  --     Estimated Creatinine Clearance: 46.5 mL/min (A) (by C-G formula based on SCr of 1.88 mg/dL (H)).   Medical History: Past Medical History:  Diagnosis Date  . Anemia   . Chronic combined systolic and diastolic heart failure (HCC) 07/05/2017   Non-ischemic CM // Echo 9/18:EF 20-25, severe diff HK, Gr 1 DD, mild MR, mod LAE // R/L Heart Cath 9/18: pD1 40, mD1 30; o/w normal cors // Echo 5/19: EF 35-40, trivial AI, mildly dilated aortic root (38 mm), MAC, mild LAE   . Hyperlipidemia   . Hypertension    Pt states she "doesn't have HTN"  . Morbid obesity ([place])   . Thyroid disease hyperthyroidism    Medications:  Medications Prior to Admission  Medication Sig Dispense Refill Last Dose  . albuterol (PROVENTIL HFA;VENTOLIN HFA) 108 (90 Base) MCG/ACT inhaler Inhale 2 puffs into the lungs every 6 (six) hours as needed for wheezing or shortness of breath. 8 g 12 08/12/2019  . aspirin EC 81 MG tablet Take 81 mg by mouth daily.   08/13/2019 at Unknown time  . atorvastatin (LIPITOR) 80 MG tablet Take 1 tablet (80 mg total) by mouth daily. 90 tablet 1  08/02/2019 at Unknown time  . carvedilol (COREG) 12.5 MG tablet Take 1 tablet (12.5 mg total) by mouth 2 (two) times daily. 180 tablet 3 08/13/2019 at 0800  . furosemide (LASIX) 20 MG tablet Take 1 tablet (20 mg total) by mouth daily as needed for fluid or edema. 90 tablet 3 08/13/2019 at Unknown time  . levothyroxine (SYNTHROID, LEVOTHROID) 125 MCG tablet Take 1 tablet (125 mcg total) by mouth daily. 90 tablet 3 08/17/2019 at Unknown time  . sacubitril-valsartan (ENTRESTO) 97-103 MG Take 1 tablet by mouth 2 (two) times daily. 60 tablet 11 08/07/2019 at Unknown time  . spironolactone (ALDACTONE) 25 MG tablet Take 1 tablet (25 mg total) by mouth daily. 90 tablet 3 08/02/2019 at Unknown time    Assessment: 24 YOF with COVID-19 associated hypoxia enrolled in treatment arm of COVID-PACT trial on Lovenox treatment arm. Given new AFib, pharmacy consulted to start IV heparin.   -Lovenox peak level is supratherapeutic at >2 on 11/1 on Lovenox 130 mg twice daily. Given therapeutic lovenox levels, will plan to start IV heparin this evening.  Goal of Therapy:  Heparin level 0.3-0.7 units/ml Monitor platelets by anticoagulation protocol: Yes   Plan:  -Start IV heparin at 1200 units/hr at 8 PM today  -f/u 8 hr HL after infusion started  -Monitor daily HL, CBC and s/s of bleeding   Albertina Parr, PharmD., BCPS Clinical Pharmacist Clinical phone for 08/21/19 until 5pm: [phone]

## 2019-08-21 NOTE — Progress Notes (Signed)
Consult placed for 3rd PIV site. Pt currently has a midline and a PIV with multi vesicant meds ordered. Spoke with Purvis Kilts, RN and informed her that this pt needs a central line or PICC line due to IV needs. RN to notify MD. Will continue to be available as needed.  Randol Kern, RN VAST

## 2019-08-21 NOTE — Progress Notes (Signed)
PROGRESS NOTE  Brittney Ross [id number] DOB: 05-07-1961 DOA: 08/16/2019  PCP: Azzie Glatter, FNP  Brief History/Interval Summary: 58 year old with a history of systolic congestive heart failure (30-35% EF), HTN, HLD, obesity, and hypothyroidism who presented with complaints of severe shortness of breath with a cough which had been worsening over approximately 2 days.  Upon arrival in the ED she was found to be hypoxic at 88% on room air.  A chest x-ray suggested only pulmonary vascular congestion.  Shortly after her admission to [facility] she developed significant increased work of breathing with respiratory rates in the 40-50s.  Her Covid test subsequently returned positive and treatment was initiated with Decadron, remdesivir, and Actemra.   Reason for Visit: Acute respiratory failure with hypoxia.  Pneumonia due to COVID-19.  Consultants: Pulmonology.  Nephrology  Procedures:   Transthoracic echocardiogram 1. Left ventricular ejection fraction, by visual estimation, is 55 to 60%. The left ventricle has normal function. Normal left ventricular size. There is mildly increased left ventricular hypertrophy.  2. Left ventricular diastolic Doppler parameters are consistent with impaired relaxation pattern of LV diastolic filling.  3. Apical window is foreshortened.  4. Global right ventricle has normal systolic function.The right ventricular size is normal. No increase in right ventricular wall thickness.  5. Left atrial size was normal.  6. Right atrial size was normal.  7. Mild to moderate mitral annular calcification.  8. The mitral valve is grossly normal. Trace mitral valve regurgitation.  9. The tricuspid valve is normal in structure. Tricuspid valve regurgitation is trivial. 10. The aortic valve is tricuspid Aortic valve regurgitation was not visualized by color flow Doppler. Mild to moderate aortic valve sclerosis/calcification without any evidence of aortic stenosis. 11.  The pulmonic valve was not well visualized. Pulmonic valve regurgitation is not visualized by color flow Doppler. 12. The left ventricular function has improved.   Antibiotics: Anti-infectives (From admission, onward)   Start     Dose/Rate Route Frequency Ordered Stop   08/16/19 1000  remdesivir 100 mg in sodium chloride 0.9 % 250 mL IVPB     100 mg 500 mL/hr over 30 Minutes Intravenous Every 24 hours 08/15/19 1004 08/19/19 1141   08/15/19 1100  remdesivir 200 mg in sodium chloride 0.9 % 250 mL IVPB     200 mg 500 mL/hr over 30 Minutes Intravenous Once 08/15/19 1004 08/15/19 1200      Subjective/Interval History: Patient noted to be on a nonrebreather.  She states that she is feeling tired.  Continues to have shortness of breath.  Denies any chest pain.  Overnight events noted.  She had atrial fibrillation with RVR and had a drop in her blood pressure as well.   Assessment/Plan:  Acute Hypoxic Resp. Failure/Pneumonia due to COVID-19  Vent Mode: BIPAP FiO2 (%):  [90 %-100 %] 100 % Set Rate:  [10 bmp] 10 bmp     Component Value Date/Time   PHART 7.366 08/21/2019 0813   PCO2ART 31.3 (L) 08/21/2019 0813   PO2ART 54.0 (L) 08/21/2019 0813   HCO3 17.9 (L) 08/21/2019 0813   TCO2 19 (L) 08/21/2019 0813   ACIDBASEDEF 6.0 (H) 08/21/2019 0813   O2SAT 87.0 08/21/2019 0813    COVID-19 Labs  Recent Labs    08/19/19 0420 08/20/19 0320 08/21/19 0300  DDIMER 1.37* 3.06* 3.19*  CRP 6.0* 3.8* 2.2*    Lab Results  Component Value Date   SARSCOV2NAA POSITIVE (A) 08/01/2019    Fever: Remains afebrile Oxygen requirements: Alternating between  BiPAP and nonrebreather.  Saturating in the late 80s. Antibacterials: None Remdesivir: Completed course on 10/30 Steroids: Dexamethasone 6 mg daily Diuretics: Lasix being given by nephrology Actemra: 1 dose given on 10/26 Convalescent plasma: Transfused on 10/28 DVT Prophylaxis: On Lovenox as part of the Covid pact trial   Research  Studies: COVID PACT Trial  Patient's respiratory status remains very tenuous.  Pulmonology continues to follow.  Chest x-ray continues to show severe disease in both the lungs with bilateral opacities.  Patient requiring BiPAP alternating with nonrebreather.  Significantly elevated WBC most likely due to steroids.  Procalcitonin was less than 0.1 on 10/25.  Could consider repeating.  Diuretics per nephrology.  From a COVID-19 standpoint patient has completed course of remdesivir.  She has received convalescent plasma and Actemra.  She remains on steroids.  Her CRP has improved to 2.2.  D-dimer 3.19.  She is enrolled into the Covid pact trial.  Atrial fibrillation with RVR Went into atrial fibrillation with RVR overnight.  Was given fluid bolus due to hypotension.  Heart rate improved.  Patient on dopamine.  Amiodarone infusion ordered.   She is in the anticoagulation trial for Covid.  However we will need to consider therapeutic anticoagulation.     Acute kidney injury/hyperkalemia/normal anion gap metabolic acidosis Baseline creatinine around 1.7.  Rising BUN and creatinine initially thought to be due to diuretics.  Diuretics were held.  Creatinine noted to be higher yesterday.  She had not made much urine.  Nephrology saw the patient.  She was started on high-dose Lasix.  Improvement in creatinine noted today.  Potassium is normal.    Acute on chronic systolic CHF/concern for acute diastolic CHF Based on echocardiogram from 2019 her EF was 35 to 40%.  Echocardiogram done during this hospitalization shows improvement in EF to 55%.  At the time of admission BNP was elevated at 314.  Entresto and carvedilol were stopped due to bradycardia and renal failure.  Monitor urine output.  Ins and outs.  Daily weights.  Sinus bradycardia Patient has had episodes of sinus bradycardia.  This is in the setting of the use of Precedex as well as carvedilol.  Carvedilol was discontinued.  TSH is 1.46 on 10/25.  Now  in atrial fibrillation as discussed above.      Morbid obesity Estimated body mass index is 50.55 kg/m as calculated from the following:   Height as of this encounter: 5\' 5"  (1.651 m).   Weight as of this encounter: 137.8 kg.  Hyperlipidemia Continue statin.  Hypothyroidism Continue Synthroid.  Diabetes mellitus type 2, uncontrolled with hyperglycemia HbA1c 7.0.  CBGs are reasonably well controlled.  Continue Lantus and SSI.  Essential hypertension Continue to monitor blood pressures.  FEN Not on any IV fluids.  Monitor electrolytes.     DVT Prophylaxis:  COVID PACT Trial PUD Prophylaxis: Continue Protonix Code Status: Full code Family Communication: Son being updated on a daily basis Disposition Plan: Remain in ICU for now   Medications:  Scheduled: . Chlorhexidine Gluconate Cloth  6 each Topical Daily  . dexamethasone (DECADRON) injection  6 mg Intravenous Q24H  . docusate sodium  100 mg Oral BID  . enoxaparin (LOVENOX) injection  80 mg Subcutaneous Q12H  . feeding supplement (NEPRO CARB STEADY)  237 mL Oral TID BM  . furosemide  80 mg Intravenous Q8H  . insulin aspart  0-9 Units Subcutaneous TID WC  . insulin glargine  12 Units Subcutaneous QHS  . mouth rinse  15 mL  Mouth Rinse BID  . metolazone  10 mg Oral Daily  . polyethylene glycol  17 g Oral Daily   Continuous: . amiodarone 60 mg/hr (08/21/19 1136)   Followed by  . amiodarone    . dexmedetomidine (PRECEDEX) IV infusion 28.121 mcg/kg/hr (08/20/19 2325)  . DOPamine Stopped (08/20/19 2159)  . famotidine (PEPCID) IV 20 mg (08/21/19 1130)   [id number], albuterol, haloperidol lactate, lip balm, oxyCODONE, sodium chloride, sodium chloride flush, traMADol   Objective:  Vital Signs  Vitals:   08/21/19 0820 08/21/19 0900 08/21/19 0937 08/21/19 1000  BP: 135/75 (!) 90/57 (!) 90/57 132/90  Pulse: (!) 123  (!) 121   Resp: (!) 30 (!) 25 (!) 32 (!) 25  Temp:      TempSrc:      SpO2: 100%  96%    Weight:      Height:        Intake/Output Summary (Last 24 hours) at 08/21/2019 1158 Last data filed at 08/21/2019 0700 Gross per 24 hour  Intake 1673.04 ml  Output 2450 ml  Net -776.96 ml   Filed Weights   08/19/19 0500 08/20/19 0500 08/21/19 0310  Weight: 129.2 kg 129.4 kg (!) 137.8 kg    General appearance: Awake alert.  In no distress.  Morbidly obese Resp: Tachypneic.  No use of accessory muscles.  Little bit more short of breath today.  Coarse breath sounds with crackles at the bases.  No wheezing or rhonchi. Cardio: S1-S2 is irregularly irregular.  No S3-S4.  No rubs murmurs or bruit.   GI: Abdomen is soft.  Nontender nondistended.  Bowel sounds are present normal.  No masses organomegaly Extremities: Edema in the lower extremities Neurologic: Alert and oriented x3.  No focal neurological deficits.      Lab Results:  Data Reviewed: I have personally reviewed following labs and imaging studies  CBC: Recent Labs  Lab 08/16/19 1158  08/17/19 0415  08/18/19 0741  08/19/19 0420 08/20/19 0320 08/20/19 0324 08/21/19 0300 08/21/19 0813  WBC 12.8*  --  15.3*  --  16.0*  --  14.2* 17.0*  --  18.9*  --   NEUTROABS 11.2*  --   --   --   --   --   --   --   --   --   --   HGB 13.3   < > 13.2   < > 13.8   < > 14.0 13.4 13.9 14.5 15.3*  HCT 41.6   < > 41.4   < > 43.0   < > 44.4 42.5 41.0 45.2 45.0  MCV 90.0  --  89.2  --  89.4  --  89.5 90.6  --  88.1  --   PLT 154  --  164  --  176  --  166 157  --  161  --    < > = values in this interval not displayed.    Basic Metabolic Panel: Recent Labs  Lab 08/16/19 1158  08/17/19 0415  08/18/19 0741  08/19/19 0420 08/19/19 1654 08/20/19 0320 08/20/19 0324 08/21/19 0300 08/21/19 0813  NA 136   < > 139   < > 137   < > 137 139 140 142 143 142  K 4.0   < > 4.9   < > 5.0   < > 5.4* 4.9 5.0 4.9 4.2 4.2  CL 103  --  104  --  105  --  107 107 109  --  110  --  CO2 22  --  21*  --  21*  --  19* 19* 18*  --  18*  --   GLUCOSE  177*  --  157*  --  107*  --  162* 217* 142*  --  89  --   BUN 49*  --  70*  --  91*  --  106* 107* 109*  --  107*  --   CREATININE 1.25*  --  1.88*  --  2.42*  --  2.56* 2.01* 2.27*  --  1.88*  --   CALCIUM 8.2*  --  8.4*  --  8.3*  --  8.2* 8.4* 8.6*  --  8.8*  --   MG 2.5*  --  2.5*  --  2.6*  --  2.8*  --  3.0*  --  2.9*  --   PHOS 3.5  --  4.1  --  4.8*  --  5.7*  --   --   --  5.7*  --    < > = values in this interval not displayed.    GFR: Estimated Creatinine Clearance: 46.5 mL/min (A) (by C-G formula based on SCr of 1.88 mg/dL (H)).  Liver Function Tests: Recent Labs  Lab 08/15/19 0342 08/16/19 1158 08/17/19 0415 08/18/19 0741 08/19/19 0420 08/21/19 0300  AST 16 31 38 53* 42*  --   ALT 15 19 19 23 24   --   ALKPHOS 56 54 58 61 66  --   BILITOT 1.3* 0.9 0.6 1.0 0.9  --   PROT 7.8 7.7 7.8 7.8 7.6  --   ALBUMIN 3.2* 3.1* 3.2* 3.2* 3.4* 3.5    HbA1C: No results for input(s): HGBA1C in the last 72 hours.  CBG: Recent Labs  Lab 08/20/19 1216 08/20/19 1621 08/20/19 2302 08/21/19 0812 08/21/19 1142  GLUCAP 138* 235* 118* 153* 119*      Recent Results (from the past 240 hour(s))  SARS CORONAVIRUS 2 (TAT 6-24 HRS) Nasopharyngeal Nasopharyngeal Swab     Status: Abnormal   Collection Time: 08/10/2019  1:06 PM   Specimen: Nasopharyngeal Swab  Result Value Ref Range Status   SARS Coronavirus 2 POSITIVE (A) NEGATIVE Final    Comment: RESULT CALLED TO, READ BACK BY AND VERIFIED WITH: Reita Chard, RN AT [phone] ON 08/15/2019 BY SAINVILUS S (NOTE) SARS-CoV-2 target nucleic acids are DETECTED. The SARS-CoV-2 RNA is generally detectable in upper and lower respiratory specimens during the acute phase of infection. Positive results are indicative of active infection with SARS-CoV-2. Clinical  correlation with patient history and other diagnostic information is necessary to determine patient infection status. Positive results do  not rule out bacterial infection or  co-infection with other viruses. The expected result is Negative. Fact Sheet for Patients: SugarRoll.be Fact Sheet for Healthcare Providers: https://www.woods-mathews.com/ This test is not yet approved or cleared by the [place] FDA and  has been authorized for detection and/or diagnosis of SARS-CoV-2 by FDA under an Emergency Use Authorization (EUA). This EUA will remain  in effect (meaning this test  can be used) for the duration of the COVID-19 declaration under Section 564(b)(1) of the Act, 21 U.S.C. section 360bbb-3(b)(1), unless the authorization is terminated or revoked sooner. Performed at [facility], [place] [address], [place], [place] [address]   MRSA PCR Screening     Status: None   Collection Time: 08/15/19 10:59 AM   Specimen: Nasal Mucosa; Nasopharyngeal  Result Value Ref Range Status   MRSA by PCR  NEGATIVE NEGATIVE Final    Comment:        The GeneXpert MRSA Assay (FDA approved for NASAL specimens only), is one component of a comprehensive MRSA colonization surveillance program. It is not intended to diagnose MRSA infection nor to guide or monitor treatment for MRSA infections. Performed at [facility], [place] [address], [place], [place] [address]       Radiology Studies: Dg Chest Port 1 View  Result Date: 08/21/2019 CLINICAL DATA:  Positive for COVID-19.  Shortness of breath. EXAM: PORTABLE CHEST 1 VIEW COMPARISON:  August 18, 2019 FINDINGS: Stable cardiomediastinal silhouette. Diffuse bilateral pulmonary opacities are identified. The opacities are less dense in the interval, possibly due to improved lung volumes. IMPRESSION: Diffuse bilateral pulmonary opacities remain, likely due to the patient's COVID-19 diagnosis. While the opacities remain diffuse and significant, they are less dense in the interval, possibly due to better lung volumes. Electronically Signed   By: Dorise Bullion III M.D    On: 08/21/2019 11:08       LOS: 7 days   [facility] Pager on [email or web address]  08/21/2019, 11:58 AM

## 2019-08-21 NOTE — Progress Notes (Signed)
[facility] Progress Note  Subjective: 1.6L in and 2.4 L UOP , BUN 107 and creat 1.88 this am both are down. Wt's down, no change in resp status on NRB 15L.    Patient not examined directly given COVID-19 + status, utilizing exam of the primary team and observations of RN's.    Vitals:   08/21/19 0500 08/21/19 0600 08/21/19 0615 08/21/19 0630  BP: 114/76 97/84    Pulse: (!) 51 78 66 67  Resp: (!) 29 (!) 39 (!) 30 (!) 35  Temp:      TempSrc:      SpO2: 99% 90% 95% 90%  Weight:      Height:        Inpatient medications: . Chlorhexidine Gluconate Cloth  6 each Topical Daily  . dexamethasone (DECADRON) injection  6 mg Intravenous Q24H  . docusate sodium  100 mg Oral BID  . enoxaparin (LOVENOX) injection  1 mg/kg Subcutaneous Q12H  . feeding supplement (NEPRO CARB STEADY)  237 mL Oral TID BM  . furosemide  80 mg Intravenous Q8H  . insulin aspart  0-9 Units Subcutaneous TID WC  . insulin glargine  12 Units Subcutaneous QHS  . mouth rinse  15 mL Mouth Rinse BID  . metolazone  10 mg Oral Daily  . polyethylene glycol  17 g Oral Daily   . dexmedetomidine (PRECEDEX) IV infusion 28.121 mcg/kg/hr (08/20/19 2325)  . DOPamine Stopped (08/20/19 2159)  . famotidine (PEPCID) IV Stopped (08/20/19 2345)   acetaminophen, albuterol, haloperidol lactate, lip balm, oxyCODONE, sodium chloride, sodium chloride flush, traMADol    Exam:  Patient not examined directly given COVID-19 + status, utilizing exam of the primary team and observations of RN's.      Home meds:  - spironolactone 25 qd/ sacubitril-valsartan 97- 103 bid/ carvedilol 12.5 bid  - aspirin 81/ atorvastatin 80 qd  - levothyroxine 125 qd  - prn's/ vitamins/ supplements     Assessment/ Plan: 1. Acute renal failure - due to attempted diuresis/ intravasc vol depletion + ARB effect.  Lasix and ARB dc'd.  Getting low dose dopamine for renal perfusion.  I/O since admission are even, no gross vol excess on exam  yest. Due to severity of lung disease need to lower volume so we are repeating trial of lasix 80 tid IV but w/o ARB, hopefully renal fxn will not destabilize this time.  - diuresed overnight, B/Cr down today. Continue IV lasix - BP's soft, low threshold for midodrine 10 tid if needed.  2. COVID + PNA - severe diffuse bilat infiltrates on CXR, high flow O2 NRB 15L  3. Atrial fib - appears new , HF 120's 4. H/o CHF EF 30% 5. HL 6. HTN - home meds on hold    Rob Mckynzie Liwanag 08/21/2019, 7:04 AM  Iron/TIBC/Ferritin/ %Sat    Component Value Date/Time   FERRITIN 1,159 (H) 08/16/2019 1230   Recent Labs  Lab 08/21/19 0300  NA 143  K 4.2  CL 110  CO2 18*  GLUCOSE 89  BUN 107*  CREATININE 1.88*  CALCIUM 8.8*  PHOS 5.7*  ALBUMIN 3.5  INR 1.2   Recent Labs  Lab 08/19/19 0420  AST 42*  ALT 24  ALKPHOS 66  BILITOT 0.9  PROT 7.6   Recent Labs  Lab 08/21/19 0300  WBC 18.9*  HGB 14.5  HCT 45.2  PLT 161

## 2019-08-21 DEATH — deceased

## 2019-08-22 ENCOUNTER — Inpatient Hospital Stay (HOSPITAL_COMMUNITY): Payer: BLUE CROSS/BLUE SHIELD

## 2019-08-22 DIAGNOSIS — Z01818 Encounter for other preprocedural examination: Secondary | ICD-10-CM

## 2019-08-22 DIAGNOSIS — Z0189 Encounter for other specified special examinations: Secondary | ICD-10-CM

## 2019-08-22 LAB — RENAL FUNCTION PANEL
Albumin: 3.8 g/dL (ref 3.5–5.0)
Anion gap: 18 — ABNORMAL HIGH (ref 5–15)
BUN: 116 mg/dL — ABNORMAL HIGH (ref 6–20)
CO2: 18 mmol/L — ABNORMAL LOW (ref 22–32)
Calcium: 8.9 mg/dL (ref 8.9–10.3)
Chloride: 105 mmol/L (ref 98–111)
Creatinine, Ser: 2.54 mg/dL — ABNORMAL HIGH (ref 0.44–1.00)
GFR calc Af Amer: 23 mL/min — ABNORMAL LOW (ref >60)
GFR calc non Af Amer: 20 mL/min — ABNORMAL LOW (ref >60)
Glucose, Bld: 96 mg/dL (ref 70–99)
Phosphorus: 5.6 mg/dL — ABNORMAL HIGH (ref 2.5–4.6)
Potassium: 4.1 mmol/L (ref 3.5–5.1)
Sodium: 141 mmol/L (ref 135–145)

## 2019-08-22 LAB — POCT I-STAT 7, (LYTES, BLD GAS, ICA,H+H)
Acid-base deficit: 3 mmol/L — ABNORMAL HIGH (ref 0.0–2.0)
Bicarbonate: 22.8 mmol/L (ref 20.0–28.0)
Bicarbonate: 25.6 mmol/L (ref 20.0–28.0)
Calcium, Ion: 1.09 mmol/L — ABNORMAL LOW (ref 1.15–1.40)
Calcium, Ion: 1.05 mmol/L — ABNORMAL LOW (ref 1.15–1.40)
HCT: 42 % (ref 36.0–46.0)
HCT: 39 % (ref 36.0–46.0)
Hemoglobin: 14.3 g/dL (ref 12.0–15.0)
Hemoglobin: 13.3 g/dL (ref 12.0–15.0)
O2 Saturation: 96 %
O2 Saturation: 98 %
Patient temperature: 97.7
Potassium: 4.6 mmol/L (ref 3.5–5.1)
Potassium: 4.3 mmol/L (ref 3.5–5.1)
Sodium: 142 mmol/L (ref 135–145)
Sodium: 145 mmol/L (ref 135–145)
TCO2: 24 mmol/L (ref 22–32)
TCO2: 27 mmol/L (ref 22–32)
pCO2 arterial: 43.4 mmHg (ref 32.0–48.0)
pCO2 arterial: 44.9 mmHg (ref 32.0–48.0)
pH, Arterial: 7.329 — ABNORMAL LOW (ref 7.350–7.450)
pH, Arterial: 7.362 (ref 7.350–7.450)
pO2, Arterial: 89 mmHg (ref 83.0–108.0)
pO2, Arterial: 100 mmHg (ref 83.0–108.0)

## 2019-08-22 LAB — GLUCOSE, CAPILLARY
Glucose-Capillary: 110 mg/dL — ABNORMAL HIGH (ref 70–99)
Glucose-Capillary: 147 mg/dL — ABNORMAL HIGH (ref 70–99)
Glucose-Capillary: 173 mg/dL — ABNORMAL HIGH (ref 70–99)
Glucose-Capillary: 168 mg/dL — ABNORMAL HIGH (ref 70–99)

## 2019-08-22 LAB — CBC
HCT: 43.5 % (ref 36.0–46.0)
Hemoglobin: 14 g/dL (ref 12.0–15.0)
MCH: 28.6 pg (ref 26.0–34.0)
MCHC: 32.2 g/dL (ref 30.0–36.0)
MCV: 88.8 fL (ref 80.0–100.0)
Platelets: 170 10*3/uL (ref 150–400)
RBC: 4.9 MIL/uL (ref 3.87–5.11)
RDW: 15 % (ref 11.5–15.5)
WBC: 22.7 10*3/uL — ABNORMAL HIGH (ref 4.0–10.5)
nRBC: 0 % (ref 0.0–0.2)

## 2019-08-22 LAB — C-REACTIVE PROTEIN: CRP: 1.4 mg/dL — ABNORMAL HIGH (ref <1.0)

## 2019-08-22 LAB — PHOSPHORUS: Phosphorus: 5.7 mg/dL — ABNORMAL HIGH (ref 2.5–4.6)

## 2019-08-22 LAB — PROTIME-INR
INR: 1.2 (ref 0.8–1.2)
Prothrombin Time: 15.5 seconds — ABNORMAL HIGH (ref 11.4–15.2)

## 2019-08-22 LAB — D-DIMER, QUANTITATIVE: D-Dimer, Quant: 3.86 ug/mL-FEU — ABNORMAL HIGH (ref 0.00–0.50)

## 2019-08-22 LAB — MAGNESIUM: Magnesium: 3 mg/dL — ABNORMAL HIGH (ref 1.7–2.4)

## 2019-08-22 MED ORDER — SODIUM BICARBONATE 8.4 % IV SOLN
INTRAVENOUS | Status: AC
  Filled 2019-08-22: qty 100

## 2019-08-22 MED ORDER — PHENYLEPHRINE HCL-NACL 10-0.9 MG/250ML-% IV SOLN
INTRAVENOUS | Status: AC
  Administered 2019-08-22: 150 mg
  Filled 2019-08-22: qty 250

## 2019-08-22 MED ORDER — ROCURONIUM BROMIDE 10 MG/ML (PF) SYRINGE
100.0000 mg | PREFILLED_SYRINGE | Freq: Once | INTRAVENOUS | Status: AC
  Administered 2019-08-22: 100 mg via INTRAVENOUS

## 2019-08-22 MED ORDER — SODIUM CHLORIDE 0.9 % IV BOLUS: 500.0000 mL | Freq: Once | INTRAVENOUS | Status: DC

## 2019-08-22 MED ORDER — AMIODARONE HCL 200 MG PO TABS
200.0000 mg | ORAL_TABLET | Freq: Two times a day (BID) | ORAL | Status: DC
  Administered 2019-08-22 – 2019-08-23 (×2): 200 mg via ORAL
  Filled 2019-08-22 (×2): qty 1

## 2019-08-22 MED ORDER — SODIUM BICARBONATE 8.4 % IV SOLN
100.0000 meq | Freq: Once | INTRAVENOUS | Status: AC
  Administered 2019-08-22: 100 meq via INTRAVENOUS

## 2019-08-22 MED ORDER — MIDAZOLAM BOLUS VIA INFUSION
1.0000 mg | INTRAVENOUS | Status: DC | PRN
  Filled 2019-08-22: qty 2

## 2019-08-22 MED ORDER — ARTIFICIAL TEARS OPHTHALMIC OINT
1.0000 "application " | TOPICAL_OINTMENT | Freq: Three times a day (TID) | OPHTHALMIC | Status: DC
  Administered 2019-08-22 – 2019-08-23 (×3): 1 via OPHTHALMIC
  Filled 2019-08-22: qty 3.5

## 2019-08-22 MED ORDER — MIDAZOLAM HCL 2 MG/2ML IJ SOLN
2.0000 mg | INTRAMUSCULAR | Status: DC | PRN
  Filled 2019-08-22: qty 2

## 2019-08-22 MED ORDER — PHENYLEPHRINE HCL-NACL 10-0.9 MG/250ML-% IV SOLN
INTRAVENOUS | Status: AC
  Administered 2019-08-22: 50 mg
  Filled 2019-08-22: qty 250

## 2019-08-22 MED ORDER — FENTANYL CITRATE (PF) 100 MCG/2ML IJ SOLN: 50.0000 ug | Freq: Once | INTRAMUSCULAR | Status: DC

## 2019-08-22 MED ORDER — FENTANYL CITRATE (PF) 100 MCG/2ML IJ SOLN
INTRAMUSCULAR | Status: AC
  Filled 2019-08-22: qty 2

## 2019-08-22 MED ORDER — CHLORHEXIDINE GLUCONATE 0.12% ORAL RINSE (MEDLINE KIT)
15.0000 mL | Freq: Two times a day (BID) | OROMUCOSAL | Status: DC
  Administered 2019-08-22 – 2019-08-23 (×4): 15 mL via OROMUCOSAL

## 2019-08-22 MED ORDER — CISATRACURIUM BESYLATE (PF) 10 MG/5ML IV SOLN
0.1000 mg/kg | INTRAVENOUS | Status: DC | PRN
  Administered 2019-08-22 – 2019-08-23 (×5): 13.8 mg via INTRAVENOUS
  Filled 2019-08-22 (×10): qty 6.9

## 2019-08-22 MED ORDER — MIDAZOLAM HCL 2 MG/2ML IJ SOLN
INTRAMUSCULAR | Status: AC
  Filled 2019-08-22: qty 4

## 2019-08-22 MED ORDER — NOREPINEPHRINE 4 MG/250ML-% IV SOLN
INTRAVENOUS | Status: AC
  Administered 2019-08-22: 5 ug/min via INTRAVENOUS
  Filled 2019-08-22: qty 250

## 2019-08-22 MED ORDER — FENTANYL 2500MCG IN NS 250ML (10MCG/ML) PREMIX INFUSION
50.0000 ug/h | INTRAVENOUS | Status: DC
  Administered 2019-08-22: 50 ug/h via INTRAVENOUS
  Filled 2019-08-22: qty 250

## 2019-08-22 MED ORDER — MIDAZOLAM HCL 2 MG/2ML IJ SOLN
4.0000 mg | Freq: Once | INTRAMUSCULAR | Status: AC
  Administered 2019-08-22: 4 mg via INTRAVENOUS

## 2019-08-22 MED ORDER — FENTANYL CITRATE (PF) 100 MCG/2ML IJ SOLN
100.0000 ug | Freq: Once | INTRAMUSCULAR | Status: DC
  Filled 2019-08-22: qty 2

## 2019-08-22 MED ORDER — SODIUM BICARBONATE 8.4 % IV SOLN
INTRAVENOUS | Status: AC
  Filled 2019-08-22: qty 50

## 2019-08-22 MED ORDER — FAMOTIDINE IN NACL 20-0.9 MG/50ML-% IV SOLN
20.0000 mg | Freq: Every day | INTRAVENOUS | Status: DC
  Administered 2019-08-23: 20 mg via INTRAVENOUS
  Filled 2019-08-22: qty 50

## 2019-08-22 MED ORDER — ROCURONIUM BROMIDE 10 MG/ML (PF) SYRINGE
PREFILLED_SYRINGE | INTRAVENOUS | Status: AC
  Filled 2019-08-22: qty 10

## 2019-08-22 MED ORDER — SODIUM BICARBONATE 8.4 % IV SOLN
50.0000 meq | Freq: Once | INTRAVENOUS | Status: AC
  Administered 2019-08-22: 50 meq via INTRAVENOUS

## 2019-08-22 MED ORDER — HEPARIN SODIUM (PORCINE) 1000 UNIT/ML DIALYSIS
1000.0000 [IU] | INTRAMUSCULAR | Status: DC | PRN
  Administered 2019-08-22: 2400 [IU] via INTRAVENOUS_CENTRAL
  Filled 2019-08-22 (×3): qty 6
  Filled 2019-08-22: qty 3

## 2019-08-22 MED ORDER — SODIUM BICARBONATE 8.4 % IV SOLN
INTRAVENOUS | Status: AC
  Administered 2019-08-22: 50 meq
  Filled 2019-08-22: qty 100

## 2019-08-22 MED ORDER — PHENYLEPHRINE HCL-NACL 10-0.9 MG/250ML-% IV SOLN
INTRAVENOUS | Status: AC
  Filled 2019-08-22: qty 250

## 2019-08-22 MED ORDER — MIDAZOLAM 50MG/50ML (1MG/ML) PREMIX INFUSION
1.0000 mg/h | INTRAVENOUS | Status: DC
  Administered 2019-08-22: 2 mg/h via INTRAVENOUS
  Filled 2019-08-22: qty 50

## 2019-08-22 MED ORDER — PHENYLEPHRINE HCL-NACL 10-0.9 MG/250ML-% IV SOLN
INTRAVENOUS | Status: AC
  Administered 2019-08-22: 20 mg
  Filled 2019-08-22: qty 250

## 2019-08-22 MED ORDER — FENTANYL 2500MCG IN NS 250ML (10MCG/ML) PREMIX INFUSION
50.0000 ug/h | INTRAVENOUS | Status: DC
  Administered 2019-08-22: 200 ug/h via INTRAVENOUS
  Administered 2019-08-22: 150 ug/h via INTRAVENOUS
  Administered 2019-08-23: 10:00:00 200 ug/h via INTRAVENOUS
  Filled 2019-08-22 (×3): qty 250

## 2019-08-22 MED ORDER — ETOMIDATE 2 MG/ML IV SOLN
INTRAVENOUS | Status: AC
  Administered 2019-08-22: 20 mg
  Filled 2019-08-22: qty 20

## 2019-08-22 MED ORDER — NOREPINEPHRINE 4 MG/250ML-% IV SOLN
0.0000 ug/min | INTRAVENOUS | Status: DC
  Administered 2019-08-22: 35 ug/min via INTRAVENOUS
  Administered 2019-08-22: 20:00:00 5 ug/min via INTRAVENOUS
  Filled 2019-08-22: qty 250

## 2019-08-22 MED ORDER — ORAL CARE MOUTH RINSE
15.0000 mL | OROMUCOSAL | Status: DC
  Administered 2019-08-22 – 2019-08-24 (×15): 15 mL via OROMUCOSAL

## 2019-08-22 MED ORDER — FENTANYL BOLUS VIA INFUSION
50.0000 ug | INTRAVENOUS | Status: DC | PRN
  Filled 2019-08-22: qty 50

## 2019-08-22 MED ORDER — VASOPRESSIN 20 UNIT/ML IV SOLN
0.0300 [IU]/min | INTRAVENOUS | Status: DC
  Administered 2019-08-22 – 2019-08-23 (×2): 0.03 [IU]/min via INTRAVENOUS
  Filled 2019-08-22 (×3): qty 2

## 2019-08-22 MED ORDER — ETOMIDATE 2 MG/ML IV SOLN: 20.0000 mg | Freq: Once | INTRAVENOUS | Status: DC

## 2019-08-22 MED ORDER — EPINEPHRINE 1 MG/10ML IJ SOSY
PREFILLED_SYRINGE | INTRAMUSCULAR | Status: AC
  Filled 2019-08-22: qty 10

## 2019-08-22 MED ORDER — HEPARIN (PORCINE) 25000 UT/250ML-% IV SOLN
1200.0000 [IU]/h | INTRAVENOUS | Status: DC
  Administered 2019-08-22: 1200 [IU]/h via INTRAVENOUS

## 2019-08-22 MED ORDER — ROCURONIUM BROMIDE 10 MG/ML (PF) SYRINGE
1.0000 mg/kg | PREFILLED_SYRINGE | Freq: Once | INTRAVENOUS | Status: AC
  Administered 2019-08-22: 137.3 mg via INTRAVENOUS

## 2019-08-22 MED ORDER — SODIUM CHLORIDE 0.9 % IV BOLUS: 1000.0000 mL | Freq: Once | INTRAVENOUS | Status: DC

## 2019-08-22 MED ORDER — MIDAZOLAM 50MG/50ML (1MG/ML) PREMIX INFUSION
2.0000 mg/h | INTRAVENOUS | Status: DC
  Administered 2019-08-22: 6 mg/h via INTRAVENOUS
  Administered 2019-08-23 (×2): 7 mg/h via INTRAVENOUS
  Filled 2019-08-22 (×4): qty 50

## 2019-08-22 MED ORDER — SODIUM BICARBONATE 8.4 % IV SOLN: 50.0000 meq | Freq: Once | INTRAVENOUS | Status: DC

## 2019-08-22 MED ORDER — MIDAZOLAM HCL 2 MG/2ML IJ SOLN
2.0000 mg | INTRAMUSCULAR | Status: DC | PRN
  Administered 2019-08-22 (×2): 2 mg via INTRAVENOUS
  Filled 2019-08-22: qty 2

## 2019-08-22 NOTE — Procedures (Signed)
Hemodialysis Catheter Insertion Procedure Note NGUYET LAFONTANT [id number] 1961-08-15  Procedure: Insertion of Central Venous Catheter Indications: Drug and/or fluid administration and Likely hemodialysis  Procedure Details Consent: Unable to obtain consent because of emergent medical necessity. Time Out: Verified patient identification, verified procedure, site/side was marked, verified correct patient position, special equipment/implants available, medications/allergies/relevent history reviewed, required imaging and test results available.  Performed  Maximum sterile technique was used including antiseptics, cap, gloves, gown, hand hygiene, mask and sheet. Skin prep: Chlorhexidine; local anesthetic administered A antimicrobial bonded/coated triple lumen catheter was placed in the right internal jugular vein using the Seldinger technique.  Ultrasound was used to verify the patency of the vein and for real time needle guidance.  Evaluation Blood flow good Complications: No apparent complications Patient did tolerate procedure well. Chest X-ray ordered to verify placement.  CXR: pending.  Simonne Maffucci 08/22/2019, 12:36 PM

## 2019-08-22 NOTE — Progress Notes (Signed)
Pt currently on Amiodarone gtt at 30mg /hr.  Pt briefly became bradycardic in the upper 40's to low 50's, non sustained.  Amiodarone gtt was briefly placed on hold while E-link MD was called and informed.  Amiodarone restarted per MD.  Will continue to monitor pt.

## 2019-08-22 NOTE — Progress Notes (Signed)
ANTICOAGULATION CONSULT NOTE - Follow Up Consult  Pharmacy Consult for Heparin Indication: atrial fibrillation  No Known Allergies  Patient Measurements: Height: 5' 5"  (165.1 cm) Weight: (!) 302 lb 11.1 oz (137.3 kg) IBW/kg (Calculated) : 57 Heparin Dosing Weight: 92 kg  Vital Signs: Temp: 97.3 F (36.3 C) (11/02 0800) Temp Source: Axillary (11/02 0800) BP: 83/68 (11/02 1532) Pulse Rate: 77 (11/02 1513)  Labs: Recent Labs    08/19/19 2150 08/20/19 0320  08/21/19 0300 08/21/19 0813 08/22/19 0523 08/22/19 0524 08/22/19 1456  HGB  --  13.4   < > 14.5 15.3* 14.0  --  14.3  HCT  --  42.5   < > 45.2 45.0 43.5  --  42.0  PLT  --  157  --  161  --  170  --   --   LABPROT  --  15.1  --  15.2  --  15.5*  --   --   INR  --  1.2  --  1.2  --  1.2  --   --   HEPRLOWMOCWT >2.00  --   --  >2.00  --   --   --   --   CREATININE  --  2.27*  --  1.88*  --   --  2.54*  --    < > = values in this interval not displayed.    Estimated Creatinine Clearance: 34.4 mL/min (A) (by C-G formula based on SCr of 2.54 mg/dL (H)).   Medications:  Scheduled:  . amiodarone  200 mg Oral BID  . chlorhexidine gluconate (MEDLINE KIT)  15 mL Mouth Rinse BID  . Chlorhexidine Gluconate Cloth  6 each Topical Daily  . docusate sodium  100 mg Oral BID  . EPINEPHrine      . etomidate  20 mg Intravenous Once  . feeding supplement (NEPRO CARB STEADY)  237 mL Oral TID BM  . fentaNYL      . fentaNYL (SUBLIMAZE) injection  100 mcg Intravenous Once  . fentaNYL (SUBLIMAZE) injection  50 mcg Intravenous Once  . furosemide  80 mg Intravenous Q8H  . insulin aspart  0-9 Units Subcutaneous TID WC  . insulin glargine  12 Units Subcutaneous QHS  . mouth rinse  15 mL Mouth Rinse BID  . mouth rinse  15 mL Mouth Rinse 10 times per day  . metolazone  10 mg Oral Daily  . midazolam      . polyethylene glycol  17 g Oral Daily  . rocuronium bromide      . sodium bicarbonate  50 mEq Intravenous Once   Infusions:  .  dexmedetomidine (PRECEDEX) IV infusion 28.121 mcg/kg/hr (08/20/19 2325)  . DOPamine Stopped (08/20/19 2159)  . famotidine (PEPCID) IV 20 mg (08/22/19 0857)  . fentaNYL infusion INTRAVENOUS 150 mcg/hr (08/22/19 1409)  . midazolam      Assessment: 18 YOF with COVID-19 associated hypoxia enrolled in treatment arm of COVID-PACT trial on Lovenox treatment arm. Given new AFib, pharmacy consulted to start IV heparin, then changed to Lovenox d/t lack of IV access.  She has worsening AKI and an HD cath was placed on 11/2.  -Lovenox peak level was supratherapeutic at >2 on 11/1 on Lovenox 130 mg twice daily. Lovenox was reduced to 18m BID. - Most recent Lovenox given 11/2 at 11:15 AM. - CBC: Hgb and Plt stable, WNL - SCr up to 2.5  Goal of Therapy:  Heparin level 0.3-0.7 units/ml Monitor platelets by anticoagulation protocol: Yes  Plan:  Start heparin IV infusion at 1200 units/hr at 2200. Heparin level 8 hours after starting. Daily heparin level, CBC, s/s bleeding.   Gretta Arab PharmD, BCPS Clinical pharmacist phone 7am- 5pm: [phone] 08/22/2019 3:55 PM

## 2019-08-22 NOTE — Progress Notes (Signed)
Patient ID: Brittney Ross, female   DOB: 08/08/1961, 58 y.o.   MRN: [id number] S: Pt became acutely short of breath this am and was intubated by PCCM O:BP 120/83   Pulse 67   Temp (!) 97.3 F (36.3 C) (Axillary)   Resp (!) 24   Ht 5\' 5"  (1.651 m)   Wt (!) 137.3 kg   LMP 10/28/2011   SpO2 (!) 81%   BMI 50.37 kg/m   Intake/Output Summary (Last 24 hours) at 08/22/2019 1149 Last data filed at 08/22/2019 0800 Gross per 24 hour  Intake 603.03 ml  Output 1300 ml  Net -696.97 ml   Intake/Output: I/O last 3 completed shifts: In: 1724.3 [P.O.:360; I.V.:516.9; IV Piggyback:847.3] Out: 1750 [Urine:1750]  Intake/Output this shift:  Total I/O In: 33.3 [I.V.:33.3] Out: 200 [Urine:200] Weight change: -0.5 kg Gen: obese, AAF intubated CVS: no rub Resp: bibasilar crackles Abd: +BS, soft, NT/ND Ext: no edema  Recent Labs  Lab 08/16/19 1158  08/17/19 0415  08/18/19 0741  08/19/19 0420 08/19/19 1654 08/20/19 0320 08/20/19 0324 08/21/19 0300 08/21/19 0813 08/22/19 0523 08/22/19 0524  NA 136   < > 139   < > 137   < > 137 139 140 142 143 142  --  141  K 4.0   < > 4.9   < > 5.0   < > 5.4* 4.9 5.0 4.9 4.2 4.2  --  4.1  CL 103  --  104  --  105  --  107 107 109  --  110  --   --  105  CO2 22  --  21*  --  21*  --  19* 19* 18*  --  18*  --   --  18*  GLUCOSE 177*  --  157*  --  107*  --  162* 217* 142*  --  89  --   --  96  BUN 49*  --  70*  --  91*  --  106* 107* 109*  --  107*  --   --  116*  CREATININE 1.25*  --  1.88*  --  2.42*  --  2.56* 2.01* 2.27*  --  1.88*  --   --  2.54*  ALBUMIN 3.1*  --  3.2*  --  3.2*  --  3.4*  --   --   --  3.5  --   --  3.8  CALCIUM 8.2*  --  8.4*  --  8.3*  --  8.2* 8.4* 8.6*  --  8.8*  --   --  8.9  PHOS 3.5  --  4.1  --  4.8*  --  5.7*  --   --   --  5.7*  --  5.7* 5.6*  AST 31  --  38  --  53*  --  42*  --   --   --   --   --   --   --   ALT 19  --  19  --  23  --  24  --   --   --   --   --   --   --    < > = values in this interval not displayed.    Liver Function Tests: Recent Labs  Lab 08/17/19 0415 08/18/19 0741 08/19/19 0420 08/21/19 0300 08/22/19 0524  AST 38 53* 42*  --   --   ALT 19 23 24   --   --  ALKPHOS 58 61 66  --   --   BILITOT 0.6 1.0 0.9  --   --   PROT 7.8 7.8 7.6  --   --   ALBUMIN 3.2* 3.2* 3.4* 3.5 3.8   No results for input(s): LIPASE, AMYLASE in the last 168 hours. No results for input(s): AMMONIA in the last 168 hours. CBC: Recent Labs  Lab 08/16/19 1158  08/18/19 0741  08/19/19 0420 08/20/19 0320  08/21/19 0300 08/21/19 0813 08/22/19 0523  WBC 12.8*   < > 16.0*  --  14.2* 17.0*  --  18.9*  --  22.7*  NEUTROABS 11.2*  --   --   --   --   --   --   --   --   --   HGB 13.3   < > 13.8   < > 14.0 13.4   < > 14.5 15.3* 14.0  HCT 41.6   < > 43.0   < > 44.4 42.5   < > 45.2 45.0 43.5  MCV 90.0   < > 89.4  --  89.5 90.6  --  88.1  --  88.8  PLT 154   < > 176  --  166 157  --  161  --  170   < > = values in this interval not displayed.   Cardiac Enzymes: No results for input(s): CKTOTAL, CKMB, CKMBINDEX, TROPONINI in the last 168 hours. CBG: Recent Labs  Lab 08/21/19 0812 08/21/19 1142 08/21/19 1652 08/21/19 2145 08/22/19 0804  GLUCAP 153* 119* 151* 106* 110*    Iron Studies: No results for input(s): IRON, TIBC, TRANSFERRIN, FERRITIN in the last 72 hours. Studies/Results: Dg Chest Port 1 View  Result Date: 08/21/2019 CLINICAL DATA:  Positive for COVID-19.  Shortness of breath. EXAM: PORTABLE CHEST 1 VIEW COMPARISON:  August 18, 2019 FINDINGS: Stable cardiomediastinal silhouette. Diffuse bilateral pulmonary opacities are identified. The opacities are less dense in the interval, possibly due to improved lung volumes. IMPRESSION: Diffuse bilateral pulmonary opacities remain, likely due to the patient's COVID-19 diagnosis. While the opacities remain diffuse and significant, they are less dense in the interval, possibly due to better lung volumes. Electronically Signed   By: Dorise Bullion III  M.D   On: 08/21/2019 11:08   . amiodarone  200 mg Oral BID  . Chlorhexidine Gluconate Cloth  6 each Topical Daily  . dexamethasone (DECADRON) injection  6 mg Intravenous Q24H  . docusate sodium  100 mg Oral BID  . enoxaparin (LOVENOX) injection  80 mg Subcutaneous Q12H  . EPINEPHrine      . etomidate      . feeding supplement (NEPRO CARB STEADY)  237 mL Oral TID BM  . fentaNYL      . furosemide  80 mg Intravenous Q8H  . insulin aspart  0-9 Units Subcutaneous TID WC  . insulin glargine  12 Units Subcutaneous QHS  . mouth rinse  15 mL Mouth Rinse BID  . metolazone  10 mg Oral Daily  . midazolam      . polyethylene glycol  17 g Oral Daily  . rocuronium bromide      . sodium bicarbonate      . sodium bicarbonate  50 mEq Intravenous Once    BMET    Component Value Date/Time   NA 141 08/22/2019 0524   NA 139 04/13/2019 0720   K 4.1 08/22/2019 0524   CL 105 08/22/2019 0524   CO2 18 (L) 08/22/2019 0524  GLUCOSE 96 08/22/2019 0524   BUN 116 (H) 08/22/2019 0524   BUN 13 04/13/2019 0720   CREATININE 2.54 (H) 08/22/2019 0524   CREATININE 0.82 07/17/2017 1121   CALCIUM 8.9 08/22/2019 0524   GFRNONAA 20 (L) 08/22/2019 0524   GFRNONAA 81 07/17/2017 1121   GFRAA 23 (L) 08/22/2019 0524   GFRAA 93 07/17/2017 1121   CBC    Component Value Date/Time   WBC 22.7 (H) 08/22/2019 0523   RBC 4.90 08/22/2019 0523   HGB 14.0 08/22/2019 0523   HCT 43.5 08/22/2019 0523   PLT 170 08/22/2019 0523   MCV 88.8 08/22/2019 0523   MCH 28.6 08/22/2019 0523   MCHC 32.2 08/22/2019 0523   RDW 15.0 08/22/2019 0523   LYMPHSABS 0.9 08/16/2019 1158   MONOABS 0.6 08/16/2019 1158   EOSABS 0.0 08/16/2019 1158   BASOSABS 0.0 08/16/2019 1158     Assessment/Plan:  1. AKI- non-oliguric and multifactorial:  AKI in setting of covid infection, hypotension, IV diuresis with concomitant ARB use and intravascular volume depletion.  Now on low dose dopamine.  Diuresing due to worsening respiratory status.  BUN  cont to rise and Scr fluctuating.  No indication for dialysis at this time and will continue to follow closely as she may require CVVHD in the next 24-48 hours if her condition continues to deteriorate. 2. Covid+ PNA with hypoxemia and now with worsening respiratory status s/p intubation per PCCM.  Severe diffuse bilateral infiltrates on CXR.  On decadron which is also contributing to azotemia. 3. Atrial fib- new with RVR on amio and lovenox. 4. CHF- EF 30% 5. HTN- meds on hold due to low BP 6. VDRF- per [place], MD [facility] [phone]

## 2019-08-22 NOTE — Progress Notes (Signed)
Husband called and updated on patient status. Spouse made aware that the patient was resting comfortably on the ventilator and heart rate and oxygen were normal.

## 2019-08-22 NOTE — Evaluation (Signed)
OT Cancellation Note  Patient Details Name: Brittney Ross MRN: [id number] DOB: 16-Apr-1961   Cancelled Treatment:    Reason Eval/Treat Not Completed: Medical issues which prohibited therapy;Other (comment); pt re-intubated earlier today. Will follow up for OT eval as able.  Lou Cal, OT [facility] Pager [phone] Office [phone]   [name] 08/22/2019, 3:15 PM

## 2019-08-22 NOTE — Progress Notes (Signed)
[facility] P[facility]M  Attempted arterial line in right femoral line, could not pass wire Could not obtain flash when attempting radial arterial lines  Adding vasopressin, bicarbonate and levophed for shock Required paralytic for ventilator dyssynchrony, changing to intermittent paratytic protocol  Check ABG now  I called her husband to let her know that she is not doing well, had to leave a message  Additional cc time 45 minutes  Roselie Awkward, MD [facility] Pager: [phone] Cell: [phone] If no response, call [phone]

## 2019-08-22 NOTE — Progress Notes (Signed)
NAME:  Brittney Ross, MRN:  [id number], DOB:  1961-07-27, LOS: 8 ADMISSION DATE:  08/03/2019, CONSULTATION DATE:  10/25 REFERRING MD:  Alfredia Ferguson, CHIEF COMPLAINT:  Dyspnea   Brief History   58 y/o female with chronic systolic heart failure admitted on 10/25 to the [facility] ICU for COVID pneumonia causing acute respiratory failure with hypoxemia.  Moved to the [facility] ICU.  Past Medical History  Chronic systolic heart failure Hypertension Hypothyroidism Hyperlipidemia Obesity  Significant Hospital Events   10/26 transferred to the ICU  Consults:  PCCM Nephrology  Procedures:    Significant Diagnostic Tests:  02/2018 TTE EF 35 to 40% 07/2019 TTE> LVEF 55-60%, mild LVH, RV normal size and function  Micro Data:  10/25 SARS COV 2 >   Antimicrobials:  10/26 redmesivir > 10/30 10/26 decadron >  10/26 tocilizumab   Interim history/subjective:  Says she feels better Was too restless to wear BIPAP yesterday  Objective   Blood pressure (!) 97/59, pulse 83, temperature (!) 97.3 F (36.3 C), temperature source Axillary, resp. rate (!) 27, height 5\' 5"  (1.651 m), weight (!) 137.3 kg, last menstrual period 10/28/2011, SpO2 92 %.    FiO2 (%):  [100 %] 100 %   Intake/Output Summary (Last 24 hours) at 08/22/2019 0951 Last data filed at 08/22/2019 0800 Gross per 24 hour  Intake 603.03 ml  Output 1300 ml  Net -696.97 ml   Filed Weights   08/20/19 0500 08/21/19 0310 08/22/19 0457  Weight: 129.4 kg (!) 137.8 kg (!) 137.3 kg    Examination:  General:  Morbidly obese, increased work of breathing in bed, no accessory muscle use HENT: NCAT OP clear PULM: Crackles bases B, increased respiratory rate, but normal effort CV: RRR, no mgr GI: BS+, soft, nontender MSK: normal bulk and tone Neuro: awake, alert, no distress, MAEW  11/1 CXR images personally reviewed showing bilateral interstitial infiltrates   Resolved Hospital Problem list     Assessment & Plan:   Acute respiratory  failure with hypoxemia due to COVID 19 pneumonia Remains critically ill, increased work of breathing This will not improve rapidly If worsening work of breathing later today then proceed with intubation No more BIPAP for relief of work of breathing OK to use BIPAP at night for treatment of presumed obesity hypoventilation syndrome Continue decadron, though will discuss lowering the dose with TRH Tolerate periods of hypoxemia, goal at rest is greater than 85% SaO2, with movement ideally above 75% Decision for intubation should be based on a change in mental status or physical evidence of ventilatory failure such as nasal flaring, accessory muscle use, paradoxical breathing Out of bed to chair as able Incentive spirometry is important, use every hour Prone positioning while in bed  AKI with elevated BUN Continue lasix as per nephrology Monitor UOP Renal dose medications D/c decadron  Atrial fibrillation Tele Amiodarone Keep K > 4, Mg > 2 Now on full dose lovenox for secondary stroke prevention while in Atrial fib  Best practice:  Diet: liquid diet Pain/Anxiety/Delirium protocol (if indicated): n/a VAP protocol (if indicated): n/a DVT prophylaxis: full dose lovenox GI prophylaxis: n/a Glucose control: per TRH Mobility: bed rest Code Status: limited code, OK with short term intubation if necessary, but doesn't want CPR in the event of a cardiac arrest Family Communication: I updated her husband Jeneen Rinks on 11/2 Disposition: remain in ICU  Labs   CBC: Recent Labs  Lab 08/16/19 1158  08/18/19 0741  08/19/19 0420 08/20/19 0320 08/20/19 0324 08/21/19  0300 08/21/19 0813 08/22/19 0523  WBC 12.8*   < > 16.0*  --  14.2* 17.0*  --  18.9*  --  22.7*  NEUTROABS 11.2*  --   --   --   --   --   --   --   --   --   HGB 13.3   < > 13.8   < > 14.0 13.4 13.9 14.5 15.3* 14.0  HCT 41.6   < > 43.0   < > 44.4 42.5 41.0 45.2 45.0 43.5  MCV 90.0   < > 89.4  --  89.5 90.6  --  88.1  --  88.8   PLT 154   < > 176  --  166 157  --  161  --  170   < > = values in this interval not displayed.    Basic Metabolic Panel: Recent Labs  Lab 08/18/19 0741  08/19/19 0420 08/19/19 1654 08/20/19 0320 08/20/19 0324 08/21/19 0300 08/21/19 0813 08/22/19 0523 08/22/19 0524  NA 137   < > 137 139 140 142 143 142  --  141  K 5.0   < > 5.4* 4.9 5.0 4.9 4.2 4.2  --  4.1  CL 105  --  107 107 109  --  110  --   --  105  CO2 21*  --  19* 19* 18*  --  18*  --   --  18*  GLUCOSE 107*  --  162* 217* 142*  --  89  --   --  96  BUN 91*  --  106* 107* 109*  --  107*  --   --  116*  CREATININE 2.42*  --  2.56* 2.01* 2.27*  --  1.88*  --   --  2.54*  CALCIUM 8.3*  --  8.2* 8.4* 8.6*  --  8.8*  --   --  8.9  MG 2.6*  --  2.8*  --  3.0*  --  2.9*  --  3.0*  --   PHOS 4.8*  --  5.7*  --   --   --  5.7*  --  5.7* 5.6*   < > = values in this interval not displayed.   GFR: Estimated Creatinine Clearance: 34.4 mL/min (A) (by C-G formula based on SCr of 2.54 mg/dL (H)). Recent Labs  Lab 08/19/19 0420 08/20/19 0320 08/21/19 0300 08/22/19 0523  WBC 14.2* 17.0* 18.9* 22.7*    Liver Function Tests: Recent Labs  Lab 08/16/19 1158 08/17/19 0415 08/18/19 0741 08/19/19 0420 08/21/19 0300 08/22/19 0524  AST 31 38 53* 42*  --   --   ALT 19 19 23 24   --   --   ALKPHOS 54 58 61 66  --   --   BILITOT 0.9 0.6 1.0 0.9  --   --   PROT 7.7 7.8 7.8 7.6  --   --   ALBUMIN 3.1* 3.2* 3.2* 3.4* 3.5 3.8   No results for input(s): LIPASE, AMYLASE in the last 168 hours. No results for input(s): AMMONIA in the last 168 hours.  ABG    Component Value Date/Time   PHART 7.366 08/21/2019 0813   PCO2ART 31.3 (L) 08/21/2019 0813   PO2ART 54.0 (L) 08/21/2019 0813   HCO3 17.9 (L) 08/21/2019 0813   TCO2 19 (L) 08/21/2019 0813   ACIDBASEDEF 6.0 (H) 08/21/2019 0813   O2SAT 87.0 08/21/2019 0813     Coagulation Profile: Recent Labs  Lab 08/18/19  [id number] 08/19/19 0420 08/20/19 0320 08/21/19 0300 08/22/19 0523   INR 1.1 1.1 1.2 1.2 1.2    Cardiac Enzymes: No results for input(s): CKTOTAL, CKMB, CKMBINDEX, TROPONINI in the last 168 hours.  HbA1C: Hemoglobin A1C  Date/Time Value Ref Range Status  01/25/2019 08:15 AM 6.6 (A) 4.0 - 5.6 % Final  07/28/2018 08:15 AM 6.6 (A) 4.0 - 5.6 % Final   Hgb A1c MFr Bld  Date/Time Value Ref Range Status  07/28/2019 05:02 PM 7.0 (H) 4.8 - 5.6 % Final    Comment:    (NOTE) Pre diabetes:          5.7%-6.4% Diabetes:              >6.4% Glycemic control for   <7.0% adults with diabetes     CBG: Recent Labs  Lab 08/21/19 0812 08/21/19 1142 08/21/19 1652 08/21/19 2145 08/22/19 0804  GLUCAP 153* 119* 151* 106* 110*     Critical care time: 40 minutes    Roselie Awkward, MD [facility] Pager: [phone] Cell: [phone] If no response, call [phone]

## 2019-08-22 NOTE — Procedures (Signed)
Intubation Procedure Note Brittney Ross [id number] Dec 29, 1960  Procedure: Intubation Indications: Respiratory insufficiency  Procedure Details Consent: Risks of procedure as well as the alternatives and risks of each were explained to the (patient/caregiver).  Consent for procedure obtained. Time Out: Verified patient identification, verified procedure, site/side was marked, verified correct patient position, special equipment/implants available, medications/allergies/relevent history reviewed, required imaging and test results available.  Performed  Drugs 2m Versed, Fentanyl 1062m IV, Etomidate 20 mg IV, Rocuronium 10046mV DL x 1 with MAC 3 blade Grade 1 view 7.5 ET tube passed through cords under direct visualization Placement confirmed with bilateral breath sounds, positive EtCO2 change and smoke in tube   Evaluation Hemodynamic Status: BP stable throughout; O2 sats: transiently fell during during procedure Patient's Current Condition: stable Complications: No apparent complications Patient did tolerate procedure well. Chest X-ray ordered to verify placement.  CXR: pending.   [name]/11/2018

## 2019-08-22 NOTE — Progress Notes (Signed)
Patient husband called and updated on emergent intubation - spoke with patient on phone briefly prior to being placed on the ventilator.

## 2019-08-22 NOTE — Progress Notes (Signed)
Patient decline throughout end of shift, uable to tolerate turns at this time.  1500- Hypotensive, Neo started MD to bedside 1600- Continued Hypotension, MD to bedside for Aline attempt 1700- Neo @ 70  1730- Neo @ 100  1800- Neo @ 200 , 2 amps sodium bicarb given, 100mg  Roc given for desaturation, Levo started at 5mg   - MD at bedside attempting A line     RN to RN bedside shift report, awaiting MD to place emergent vasoactive infusion orders

## 2019-08-22 NOTE — Progress Notes (Signed)
PT Cancellation Note  Patient Details Name: Brittney Ross MRN: [id number] DOB: 01-08-1961   Cancelled Treatment:    Reason Eval/Treat Not Completed: Medical issues which prohibited therapy , placed on vent.  Claretha Cooper 08/22/2019, 5:52 PM

## 2019-08-22 NOTE — Progress Notes (Signed)
[facility] P[facility]M  Sudden change in heart rhythm to 40's, O2 saturation dropped to 60's HR recovered, O2 saturation remained in 80's with increased work of breathing  Intubated emergently Husband updated  Roselie Awkward, MD [facility] Pager: [phone] Cell: [phone] If no response, call [phone]

## 2019-08-22 NOTE — Progress Notes (Signed)
PROGRESS NOTE  Brittney Ross [id number] DOB: 06/26/1961 DOA: 08/17/2019  PCP: Azzie Glatter, FNP  Brief History/Interval Summary: 58 year old with a history of systolic congestive heart failure (30-35% EF), HTN, HLD, obesity, and hypothyroidism who presented with complaints of severe shortness of breath with a cough which had been worsening over approximately 2 days.  Upon arrival in the ED she was found to be hypoxic at 88% on room air.  A chest x-ray suggested only pulmonary vascular congestion.  Shortly after her admission to [facility] she developed significant increased work of breathing with respiratory rates in the 40-50s.  Her Covid test subsequently returned positive and treatment was initiated with Decadron, remdesivir, and Actemra.   Reason for Visit: Acute respiratory failure with hypoxia.  Pneumonia due to COVID-19.  Consultants: Pulmonology.  Nephrology  Procedures:   Transthoracic echocardiogram 1. Left ventricular ejection fraction, by visual estimation, is 55 to 60%. The left ventricle has normal function. Normal left ventricular size. There is mildly increased left ventricular hypertrophy.  2. Left ventricular diastolic Doppler parameters are consistent with impaired relaxation pattern of LV diastolic filling.  3. Apical window is foreshortened.  4. Global right ventricle has normal systolic function.The right ventricular size is normal. No increase in right ventricular wall thickness.  5. Left atrial size was normal.  6. Right atrial size was normal.  7. Mild to moderate mitral annular calcification.  8. The mitral valve is grossly normal. Trace mitral valve regurgitation.  9. The tricuspid valve is normal in structure. Tricuspid valve regurgitation is trivial. 10. The aortic valve is tricuspid Aortic valve regurgitation was not visualized by color flow Doppler. Mild to moderate aortic valve sclerosis/calcification without any evidence of aortic stenosis. 11.  The pulmonic valve was not well visualized. Pulmonic valve regurgitation is not visualized by color flow Doppler. 12. The left ventricular function has improved.   Antibiotics: Anti-infectives (From admission, onward)   Start     Dose/Rate Route Frequency Ordered Stop   08/16/19 1000  remdesivir 100 mg in sodium chloride 0.9 % 250 mL IVPB     100 mg 500 mL/hr over 30 Minutes Intravenous Every 24 hours 08/15/19 1004 08/19/19 1141   08/15/19 1100  remdesivir 200 mg in sodium chloride 0.9 % 250 mL IVPB     200 mg 500 mL/hr over 30 Minutes Intravenous Once 08/15/19 1004 08/15/19 1200      Subjective/Interval History: Patient states that she is feeling better.  Noted to be on high flow nasal cannula and nonrebreather this morning.  Patient denies any chest pain.     Assessment/Plan:  Acute Hypoxic Resp. Failure/Pneumonia due to COVID-19  FiO2 (%):  [100 %] 100 %     Component Value Date/Time   PHART 7.366 08/21/2019 0813   PCO2ART 31.3 (L) 08/21/2019 0813   PO2ART 54.0 (L) 08/21/2019 0813   HCO3 17.9 (L) 08/21/2019 0813   TCO2 19 (L) 08/21/2019 0813   ACIDBASEDEF 6.0 (H) 08/21/2019 0813   O2SAT 87.0 08/21/2019 0813    COVID-19 Labs  Recent Labs    08/20/19 0320 08/21/19 0300 08/22/19 0523  DDIMER 3.06* 3.19* 3.86*  CRP 3.8* 2.2* 1.4*    Lab Results  Component Value Date   SARSCOV2NAA POSITIVE (A) 08/20/2019    Fever: Remains afebrile Oxygen requirements: Alternating between high flow nasal cannula and BiPAP.  Occasionally requiring nonrebreather.  Saturating in the late 80s. Antibacterials: None Remdesivir: Completed course on 10/30 Steroids: Dexamethasone 6 mg daily Diuretics: Lasix being  given by nephrology Actemra: 1 dose given on 10/26 Convalescent plasma: Transfused on 10/28 DVT Prophylaxis: On full dose Lovenox  Research Studies: COVID PACT Trial  Patient's respiratory status remains tenuous.  Pulmonology continues to follow.  Chest x-ray continues to  show significant opacities in both lungs.  Patient requiring BiPAP at times.  Procalcitonin was less than 0.1 on 10/25.  WBC noted to be elevated today.  Patient is afebrile.  Check procalcitonin tomorrow along with repeat WBC.  Hold off on antibiotics for now.  Diuretics per nephrology.  From a COVID-19 standpoint she has completed course of remdesivir.  Remains on steroids.  She was given convalescent plasma and Actemra.  Inflammatory markers improved.  CRP down to 1.4.  D-dimer 3.86.   Atrial fibrillation with RVR She went into atrial fibrillation with RVR on 10/31.  She was started on amiodarone infusion.  She is noted to be in sinus rhythm this morning.  Change to oral amiodarone.  She was also changed over to therapeutic anticoagulation with Lovenox.  Echocardiogram as above.  TSH was 1.46.    Acute kidney injury/hyperkalemia/normal anion gap metabolic acidosis Baseline creatinine around 1.7.  Rising BUN and creatinine initially thought to be due to diuretics.  Diuretics were held.  Creatinine noted to be higher yesterday.  She had not made much urine.  Nephrology saw the patient.  She was started on high-dose Lasix.  Creatinine did improve yesterday but has gone up again to 2.54.  Await nephrology input.  Potassium is normal.  Monitor urine output.  She has not made much urine since she was given her last dose of furosemide.   Acute on chronic systolic CHF/concern for acute diastolic CHF Based on echocardiogram from 2019 her EF was 35 to 40%.  Echocardiogram done during this hospitalization shows improvement in EF to 55%.  At the time of admission BNP was elevated at 314.  Entresto and carvedilol were stopped due to bradycardia and renal failure.  Monitor urine output.  Ins and outs.  Daily weights.  Sinus bradycardia Patient has had episodes of sinus bradycardia.  This is in the setting of the use of Precedex as well as carvedilol.  Carvedilol was discontinued.  TSH is 1.46 on 10/25.  See  above under atrial fibrillation as well.  Morbid obesity Estimated body mass index is 50.37 kg/m as calculated from the following:   Height as of this encounter: 5\' 5"  (1.651 m).   Weight as of this encounter: 137.3 kg.  Hyperlipidemia Continue statin.  Hypothyroidism Continue Synthroid.  Diabetes mellitus type 2, uncontrolled with hyperglycemia HbA1c 7.0.  CBGs are reasonably well controlled.  Continue Lantus and SSI.  Essential hypertension Continue to monitor blood pressures.  FEN Not on any IV fluids.  Monitor electrolytes.     DVT Prophylaxis:  COVID PACT Trial PUD Prophylaxis: Continue Protonix Code Status: Full code Family Communication: Son being updated on a daily basis Disposition Plan: Remain in ICU for now   Medications:  Scheduled:  Chlorhexidine Gluconate Cloth  6 each Topical Daily   dexamethasone (DECADRON) injection  6 mg Intravenous Q24H   docusate sodium  100 mg Oral BID   enoxaparin (LOVENOX) injection  80 mg Subcutaneous Q12H   feeding supplement (NEPRO CARB STEADY)  237 mL Oral TID BM   furosemide  80 mg Intravenous Q8H   insulin aspart  0-9 Units Subcutaneous TID WC   insulin glargine  12 Units Subcutaneous QHS   mouth rinse  15 mL Mouth  Rinse BID   metolazone  10 mg Oral Daily   polyethylene glycol  17 g Oral Daily   Continuous:  amiodarone 30 mg/hr (08/22/19 0800)   dexmedetomidine (PRECEDEX) IV infusion 28.121 mcg/kg/hr (08/20/19 2325)   DOPamine Stopped (08/20/19 2159)   famotidine (PEPCID) IV 20 mg (08/22/19 0857)   [id number], albuterol, haloperidol lactate, lip balm, oxyCODONE, sodium chloride, sodium chloride flush, traMADol   Objective:  Vital Signs  Vitals:   08/22/19 0700 08/22/19 0800 08/22/19 0900 08/22/19 1000  BP: 93/83 (!) 97/59 113/79 120/83  Pulse: 76 83 81 67  Resp: (!) 26 (!) 27 (!) 28 (!) 24  Temp:  (!) 97.3 F (36.3 C)    TempSrc:  Axillary    SpO2: (!) 89% 92% 98% (!) 81%  Weight:       Height:        Intake/Output Summary (Last 24 hours) at 08/22/2019 1125 Last data filed at 08/22/2019 0800 Gross per 24 hour  Intake 603.03 ml  Output 1300 ml  Net -696.97 ml   Filed Weights   08/20/19 0500 08/21/19 0310 08/22/19 0457  Weight: 129.4 kg (!) 137.8 kg (!) 137.3 kg    General appearance: Awake alert.  In no distress.  Morbidly obese Resp:  noted to be tachypneic.  No use of accessory muscles.  Coarse breath sounds with crackles at the bases bilaterally.  No wheezing or rhonchi.  Cardio: S1-S2 is normal regular.  No S3-S4.  No rubs murmurs or bruit GI: Abdomen is soft.  Nontender nondistended.  Bowel sounds are present normal.  No masses organomegaly Extremities: Mild edema bilateral lower extremities. Neurologic: Alert and oriented x3.  No focal neurological deficits.       Lab Results:  Data Reviewed: I have personally reviewed following labs and imaging studies  CBC: Recent Labs  Lab 08/16/19 1158  08/18/19 0741  08/19/19 0420 08/20/19 0320 08/20/19 0324 08/21/19 0300 08/21/19 0813 08/22/19 0523  WBC 12.8*   < > 16.0*  --  14.2* 17.0*  --  18.9*  --  22.7*  NEUTROABS 11.2*  --   --   --   --   --   --   --   --   --   HGB 13.3   < > 13.8   < > 14.0 13.4 13.9 14.5 15.3* 14.0  HCT 41.6   < > 43.0   < > 44.4 42.5 41.0 45.2 45.0 43.5  MCV 90.0   < > 89.4  --  89.5 90.6  --  88.1  --  88.8  PLT 154   < > 176  --  166 157  --  161  --  170   < > = values in this interval not displayed.    Basic Metabolic Panel: Recent Labs  Lab 08/18/19 0741  08/19/19 0420 08/19/19 1654 08/20/19 0320 08/20/19 0324 08/21/19 0300 08/21/19 0813 08/22/19 0523 08/22/19 0524  NA 137   < > 137 139 140 142 143 142  --  141  K 5.0   < > 5.4* 4.9 5.0 4.9 4.2 4.2  --  4.1  CL 105  --  107 107 109  --  110  --   --  105  CO2 21*  --  19* 19* 18*  --  18*  --   --  18*  GLUCOSE 107*  --  162* 217* 142*  --  89  --   --  96  BUN 91*  --  106* 107* 109*  --  107*  --   --   116*  CREATININE 2.42*  --  2.56* 2.01* 2.27*  --  1.88*  --   --  2.54*  CALCIUM 8.3*  --  8.2* 8.4* 8.6*  --  8.8*  --   --  8.9  MG 2.6*  --  2.8*  --  3.0*  --  2.9*  --  3.0*  --   PHOS 4.8*  --  5.7*  --   --   --  5.7*  --  5.7* 5.6*   < > = values in this interval not displayed.    GFR: Estimated Creatinine Clearance: 34.4 mL/min (A) (by C-G formula based on SCr of 2.54 mg/dL (H)).  Liver Function Tests: Recent Labs  Lab 08/16/19 1158 08/17/19 0415 08/18/19 0741 08/19/19 0420 08/21/19 0300 08/22/19 0524  AST 31 38 53* 42*  --   --   ALT 19 19 23 24   --   --   ALKPHOS 54 58 61 66  --   --   BILITOT 0.9 0.6 1.0 0.9  --   --   PROT 7.7 7.8 7.8 7.6  --   --   ALBUMIN 3.1* 3.2* 3.2* 3.4* 3.5 3.8    HbA1C: No results for input(s): HGBA1C in the last 72 hours.  CBG: Recent Labs  Lab 08/21/19 0812 08/21/19 1142 08/21/19 1652 08/21/19 2145 08/22/19 0804  GLUCAP 153* 119* 151* 106* 110*      Recent Results (from the past 240 hour(s))  SARS CORONAVIRUS 2 (TAT 6-24 HRS) Nasopharyngeal Nasopharyngeal Swab     Status: Abnormal   Collection Time: 08/02/2019  1:06 PM   Specimen: Nasopharyngeal Swab  Result Value Ref Range Status   SARS Coronavirus 2 POSITIVE (A) NEGATIVE Final    Comment: RESULT CALLED TO, READ BACK BY AND VERIFIED WITH: Reita Chard, RN AT [id number] ON 08/15/2019 BY SAINVILUS S (NOTE) SARS-CoV-2 target nucleic acids are DETECTED. The SARS-CoV-2 RNA is generally detectable in upper and lower respiratory specimens during the acute phase of infection. Positive results are indicative of active infection with SARS-CoV-2. Clinical  correlation with patient history and other diagnostic information is necessary to determine patient infection status. Positive results do  not rule out bacterial infection or co-infection with other viruses. The expected result is Negative. Fact Sheet for Patients: SugarRoll.be Fact Sheet for  Healthcare Providers: https://www.woods-mathews.com/ This test is not yet approved or cleared by the [place] FDA and  has been authorized for detection and/or diagnosis of SARS-CoV-2 by FDA under an Emergency Use Authorization (EUA). This EUA will remain  in effect (meaning this test  can be used) for the duration of the COVID-19 declaration under Section 564(b)(1) of the Act, 21 U.S.C. section 360bbb-3(b)(1), unless the authorization is terminated or revoked sooner. Performed at [facility], [place] [address], [place], [place] [address]   MRSA PCR Screening     Status: None   Collection Time: 08/15/19 10:59 AM   Specimen: Nasal Mucosa; Nasopharyngeal  Result Value Ref Range Status   MRSA by PCR NEGATIVE NEGATIVE Final    Comment:        The GeneXpert MRSA Assay (FDA approved for NASAL specimens only), is one component of a comprehensive MRSA colonization surveillance program. It is not intended to diagnose MRSA infection nor to guide or monitor treatment for MRSA infections. Performed at [facility], [place] [address] [place], [place] [address]  Radiology Studies: Dg Chest Port 1 View  Result Date: 08/21/2019 CLINICAL DATA:  Positive for COVID-19.  Shortness of breath. EXAM: PORTABLE CHEST 1 VIEW COMPARISON:  August 18, 2019 FINDINGS: Stable cardiomediastinal silhouette. Diffuse bilateral pulmonary opacities are identified. The opacities are less dense in the interval, possibly due to improved lung volumes. IMPRESSION: Diffuse bilateral pulmonary opacities remain, likely due to the patient's COVID-19 diagnosis. While the opacities remain diffuse and significant, they are less dense in the interval, possibly due to better lung volumes. Electronically Signed   By: Dorise Bullion III M.D   On: 08/21/2019 11:08       LOS: 8 days   [facility] Pager on [email or web address]  08/22/2019, 11:25 AM

## 2019-08-22 NOTE — Progress Notes (Addendum)
The patient suddenly developed increased work of breathing and shortness of breath. She subesequently developed bradycardia in the 40s. Dr. Lake Bells verbally consented the patient for intubation and mechanical ventilation. Charge RN called her husband, Jeneen Rinks, and she was able to speak to him prior to being intubated. An amp of sodium bicarbonate was administered prior to intubation per verbal order by Dr. Lake Bells. The patient was given 4mg  versed, 172mcg fentanyl,  20mg  etomidate, and 100mg  rocuronium. Intubation was completed without complication.

## 2019-08-22 NOTE — Progress Notes (Signed)
MD to bedside for Arterial line d/t hypotension and increasing vasopressors - 1 liter NS bolus and 100mg  rocuronium given

## 2019-08-22 NOTE — Progress Notes (Signed)
[facility] Progress Note Patient Name: Brittney Ross DOB: Aug 03, 1961 MRN: [id number]   Date of Service  08/22/2019  HPI/Events of Note  Bradycardia - HR decreased to 34. HR now = 58 with BP - 155/30 (MAP = 67). Patient is on Amiodarone PO.   eICU Interventions  Will order: 1. Hold 10 PM Amiodarone dose.  2. Continue to monitor HR.      Intervention Category Major Interventions: Arrhythmia - evaluation and management  Sommer,Steven Eugene 08/22/2019, 10:17 PM

## 2019-08-22 NOTE — Plan of Care (Signed)

## 2019-08-22 NOTE — Progress Notes (Signed)
Spoke with MD and Pharmacist. Per MD increase sedation to achieve ventilator compliance, no foley to be placed and prone. Pharmacist clarified that [place] Pink MD would prefer PO amio instead of the infusion.

## 2019-08-23 ENCOUNTER — Inpatient Hospital Stay (HOSPITAL_COMMUNITY): Payer: BLUE CROSS/BLUE SHIELD

## 2019-08-23 DIAGNOSIS — R570 Cardiogenic shock: Secondary | ICD-10-CM

## 2019-08-23 DIAGNOSIS — R0602 Shortness of breath: Secondary | ICD-10-CM

## 2019-08-23 DIAGNOSIS — U071 COVID-19: Secondary | ICD-10-CM

## 2019-08-23 DIAGNOSIS — J80 Acute respiratory distress syndrome: Secondary | ICD-10-CM

## 2019-08-23 DIAGNOSIS — N179 Acute kidney failure, unspecified: Secondary | ICD-10-CM

## 2019-08-23 LAB — POCT I-STAT 7, (LYTES, BLD GAS, ICA,H+H)
Acid-base deficit: 5 mmol/L — ABNORMAL HIGH (ref 0.0–2.0)
Acid-base deficit: 4 mmol/L — ABNORMAL HIGH (ref 0.0–2.0)
Bicarbonate: 21.5 mmol/L (ref 20.0–28.0)
Bicarbonate: 23.5 mmol/L (ref 20.0–28.0)
Calcium, Ion: 1.11 mmol/L — ABNORMAL LOW (ref 1.15–1.40)
Calcium, Ion: 1.12 mmol/L — ABNORMAL LOW (ref 1.15–1.40)
HCT: 34 % — ABNORMAL LOW (ref 36.0–46.0)
HCT: 36 % (ref 36.0–46.0)
Hemoglobin: 11.6 g/dL — ABNORMAL LOW (ref 12.0–15.0)
Hemoglobin: 12.2 g/dL (ref 12.0–15.0)
O2 Saturation: 88 %
O2 Saturation: 97 %
Patient temperature: 98.6
Patient temperature: 97.5
Potassium: 4.9 mmol/L (ref 3.5–5.1)
Potassium: 4.7 mmol/L (ref 3.5–5.1)
Sodium: 146 mmol/L — ABNORMAL HIGH (ref 135–145)
Sodium: 142 mmol/L (ref 135–145)
TCO2: 23 mmol/L (ref 22–32)
TCO2: 25 mmol/L (ref 22–32)
pCO2 arterial: 42.5 mmHg (ref 32.0–48.0)
pCO2 arterial: 49.4 mmHg — ABNORMAL HIGH (ref 32.0–48.0)
pH, Arterial: 7.312 — ABNORMAL LOW (ref 7.350–7.450)
pH, Arterial: 7.283 — ABNORMAL LOW (ref 7.350–7.450)
pO2, Arterial: 59 mmHg — ABNORMAL LOW (ref 83.0–108.0)
pO2, Arterial: 99 mmHg (ref 83.0–108.0)

## 2019-08-23 LAB — RENAL FUNCTION PANEL
Albumin: 3.3 g/dL — ABNORMAL LOW (ref 3.5–5.0)
Albumin: 3.1 g/dL — ABNORMAL LOW (ref 3.5–5.0)
Anion gap: 18 — ABNORMAL HIGH (ref 5–15)
Anion gap: 10 (ref 5–15)
BUN: 116 mg/dL — ABNORMAL HIGH (ref 6–20)
BUN: 118 mg/dL — ABNORMAL HIGH (ref 6–20)
CO2: 20 mmol/L — ABNORMAL LOW (ref 22–32)
CO2: 22 mmol/L (ref 22–32)
Calcium: 8 mg/dL — ABNORMAL LOW (ref 8.9–10.3)
Calcium: 7.8 mg/dL — ABNORMAL LOW (ref 8.9–10.3)
Chloride: 106 mmol/L (ref 98–111)
Chloride: 115 mmol/L — ABNORMAL HIGH (ref 98–111)
Creatinine, Ser: 4 mg/dL — ABNORMAL HIGH (ref 0.44–1.00)
Creatinine, Ser: 3.15 mg/dL — ABNORMAL HIGH (ref 0.44–1.00)
GFR calc Af Amer: 14 mL/min — ABNORMAL LOW (ref >60)
GFR calc Af Amer: 18 mL/min — ABNORMAL LOW (ref >60)
GFR calc non Af Amer: 12 mL/min — ABNORMAL LOW (ref >60)
GFR calc non Af Amer: 16 mL/min — ABNORMAL LOW (ref >60)
Glucose, Bld: 210 mg/dL — ABNORMAL HIGH (ref 70–99)
Glucose, Bld: 211 mg/dL — ABNORMAL HIGH (ref 70–99)
Phosphorus: 9.8 mg/dL — ABNORMAL HIGH (ref 2.5–4.6)
Phosphorus: 8 mg/dL — ABNORMAL HIGH (ref 2.5–4.6)
Potassium: 4.8 mmol/L (ref 3.5–5.1)
Potassium: 5.1 mmol/L (ref 3.5–5.1)
Sodium: 144 mmol/L (ref 135–145)
Sodium: 147 mmol/L — ABNORMAL HIGH (ref 135–145)

## 2019-08-23 LAB — HEPARIN LEVEL (UNFRACTIONATED)
Heparin Unfractionated: >2.2 IU/mL — ABNORMAL HIGH (ref 0.30–0.70)
Heparin Unfractionated: >2.2 IU/mL — ABNORMAL HIGH (ref 0.30–0.70)
Heparin Unfractionated: >2.2 IU/mL — ABNORMAL HIGH (ref 0.30–0.70)
Heparin Unfractionated: 1.46 IU/mL — ABNORMAL HIGH (ref 0.30–0.70)

## 2019-08-23 LAB — CBC
HCT: 41.8 % (ref 36.0–46.0)
Hemoglobin: 13 g/dL (ref 12.0–15.0)
MCH: 28.7 pg (ref 26.0–34.0)
MCHC: 31.1 g/dL (ref 30.0–36.0)
MCV: 92.3 fL (ref 80.0–100.0)
Platelets: 156 10*3/uL (ref 150–400)
RBC: 4.53 MIL/uL (ref 3.87–5.11)
RDW: 15.4 % (ref 11.5–15.5)
WBC: 36.3 10*3/uL — ABNORMAL HIGH (ref 4.0–10.5)
nRBC: 0.1 % (ref 0.0–0.2)

## 2019-08-23 LAB — ECHOCARDIOGRAM LIMITED
Height: 65 in
Weight: 5012.38 oz

## 2019-08-23 LAB — PROTIME-INR
INR: 1.3 — ABNORMAL HIGH (ref 0.8–1.2)
Prothrombin Time: 15.8 seconds — ABNORMAL HIGH (ref 11.4–15.2)

## 2019-08-23 LAB — GLUCOSE, CAPILLARY
Glucose-Capillary: 146 mg/dL — ABNORMAL HIGH (ref 70–99)
Glucose-Capillary: 148 mg/dL — ABNORMAL HIGH (ref 70–99)
Glucose-Capillary: 173 mg/dL — ABNORMAL HIGH (ref 70–99)
Glucose-Capillary: 187 mg/dL — ABNORMAL HIGH (ref 70–99)
Glucose-Capillary: 216 mg/dL — ABNORMAL HIGH (ref 70–99)

## 2019-08-23 LAB — PROCALCITONIN: Procalcitonin: 0.18 ng/mL

## 2019-08-23 LAB — MAGNESIUM
Magnesium: 2.7 mg/dL — ABNORMAL HIGH (ref 1.7–2.4)
Magnesium: 2.7 mg/dL — ABNORMAL HIGH (ref 1.7–2.4)

## 2019-08-23 LAB — PHOSPHORUS: Phosphorus: 9.8 mg/dL — ABNORMAL HIGH (ref 2.5–4.6)

## 2019-08-23 MED ORDER — CISATRACURIUM BOLUS VIA INFUSION
2.5000 mg | Freq: Once | INTRAVENOUS | Status: AC
  Administered 2019-08-23: 2.5 mg via INTRAVENOUS
  Filled 2019-08-23: qty 3

## 2019-08-23 MED ORDER — SODIUM CHLORIDE 0.9 % IV SOLN: 200.0000 mg | INTRAVENOUS | Status: DC

## 2019-08-23 MED ORDER — VANCOMYCIN HCL 10 G IV SOLR
2000.0000 mg | Freq: Once | INTRAVENOUS | Status: AC
  Administered 2019-08-23: 12:00:00 2000 mg via INTRAVENOUS
  Filled 2019-08-23: qty 2000

## 2019-08-23 MED ORDER — VECURONIUM BROMIDE 10 MG IV SOLR: 0.0000 ug/kg/min | INTRAVENOUS | Status: DC

## 2019-08-23 MED ORDER — VITAL 1.5 CAL PO LIQD
1000.0000 mL | ORAL | Status: DC
  Filled 2019-08-23 (×2): qty 1000

## 2019-08-23 MED ORDER — PRISMASOL BGK 4/2.5 32-4-2.5 MEQ/L REPLACEMENT SOLN
Status: DC
  Administered 2019-08-23: 17:00:00 via INTRAVENOUS_CENTRAL

## 2019-08-23 MED ORDER — FENTANYL BOLUS VIA INFUSION
50.0000 ug | INTRAVENOUS | Status: DC | PRN
  Administered 2019-08-23 (×2): 50 ug via INTRAVENOUS
  Filled 2019-08-23: qty 50

## 2019-08-23 MED ORDER — HEPARIN (PORCINE) 2000 UNITS/L FOR CRRT
INTRAVENOUS_CENTRAL | Status: DC | PRN
  Filled 2019-08-23: qty 1000

## 2019-08-23 MED ORDER — FENTANYL BOLUS VIA INFUSION
50.0000 ug | INTRAVENOUS | Status: DC | PRN
  Filled 2019-08-23: qty 50

## 2019-08-23 MED ORDER — SODIUM CHLORIDE 0.9 % IV SOLN
0.0000 ug/kg/min | INTRAVENOUS | Status: DC
  Administered 2019-08-23: 3 ug/kg/min via INTRAVENOUS
  Filled 2019-08-23 (×2): qty 20

## 2019-08-23 MED ORDER — VECURONIUM BOLUS VIA INFUSION: 0.0800 mg/kg | Freq: Once | INTRAVENOUS | Status: DC

## 2019-08-23 MED ORDER — HEPARIN BOLUS VIA INFUSION (CRRT): 1000.0000 [IU] | INTRAVENOUS | Status: DC | PRN

## 2019-08-23 MED ORDER — SODIUM CHLORIDE 0.9 % IV SOLN
100.0000 mg | INTRAVENOUS | Status: DC
  Filled 2019-08-23: qty 100

## 2019-08-23 MED ORDER — PRISMASOL BGK 4/2.5 32-4-2.5 MEQ/L REPLACEMENT SOLN
Status: DC
  Administered 2019-08-23: 16:00:00 via INTRAVENOUS_CENTRAL

## 2019-08-23 MED ORDER — SODIUM CHLORIDE 0.9 % IV SOLN
2.0000 g | Freq: Once | INTRAVENOUS | Status: AC
  Administered 2019-08-23: 2 g via INTRAVENOUS
  Filled 2019-08-23: qty 2

## 2019-08-23 MED ORDER — FENTANYL CITRATE (PF) 100 MCG/2ML IJ SOLN: 50.0000 ug | Freq: Once | INTRAMUSCULAR | Status: DC

## 2019-08-23 MED ORDER — DOCUSATE SODIUM 50 MG/5ML PO LIQD
100.0000 mg | Freq: Two times a day (BID) | ORAL | Status: DC
  Administered 2019-08-23 (×2): 100 mg
  Filled 2019-08-23 (×2): qty 10

## 2019-08-23 MED ORDER — PHENYLEPHRINE HCL-NACL 10-0.9 MG/250ML-% IV SOLN
0.0000 ug/min | INTRAVENOUS | Status: DC
  Administered 2019-08-23: 30 ug/min via INTRAVENOUS

## 2019-08-23 MED ORDER — VITAL HIGH PROTEIN PO LIQD
1000.0000 mL | ORAL | Status: DC
  Administered 2019-08-23: 1000 mL

## 2019-08-23 MED ORDER — NOREPINEPHRINE 16 MG/250ML-% IV SOLN
0.0000 ug/min | INTRAVENOUS | Status: DC
  Administered 2019-08-23: 20 ug/min via INTRAVENOUS
  Administered 2019-08-23: 40 ug/min via INTRAVENOUS
  Administered 2019-08-24: 20 ug/min via INTRAVENOUS
  Filled 2019-08-23 (×3): qty 250

## 2019-08-23 MED ORDER — FENTANYL 2500MCG IN NS 250ML (10MCG/ML) PREMIX INFUSION: 50.0000 ug/h | INTRAVENOUS | Status: DC

## 2019-08-23 MED ORDER — VANCOMYCIN HCL 10 G IV SOLR
1500.0000 mg | INTRAVENOUS | Status: DC
  Filled 2019-08-23: qty 1500

## 2019-08-23 MED ORDER — SODIUM CHLORIDE 0.9 % IV SOLN
2.0000 g | Freq: Two times a day (BID) | INTRAVENOUS | Status: DC
  Administered 2019-08-23: 2 g via INTRAVENOUS
  Filled 2019-08-23: qty 2

## 2019-08-23 MED ORDER — MIDAZOLAM 50MG/50ML (1MG/ML) PREMIX INFUSION
2.0000 mg/h | INTRAVENOUS | Status: DC
  Administered 2019-08-24: 6 mg/h via INTRAVENOUS

## 2019-08-23 MED ORDER — POLYETHYLENE GLYCOL 3350 17 G PO PACK
17.0000 g | PACK | Freq: Every day | ORAL | Status: DC
  Administered 2019-08-23: 17 g
  Filled 2019-08-23: qty 1

## 2019-08-23 MED ORDER — MIDAZOLAM 50MG/50ML (1MG/ML) PREMIX INFUSION
2.0000 mg/h | INTRAVENOUS | Status: DC
  Filled 2019-08-23: qty 50

## 2019-08-23 MED ORDER — FENTANYL 2500MCG IN NS 250ML (10MCG/ML) PREMIX INFUSION
50.0000 ug/h | INTRAVENOUS | Status: DC
  Administered 2019-08-23: 200 ug/h via INTRAVENOUS
  Filled 2019-08-23: qty 250

## 2019-08-23 MED ORDER — PRISMASOL BGK 4/2.5 32-4-2.5 MEQ/L IV SOLN
INTRAVENOUS | Status: DC
  Administered 2019-08-23 (×2): via INTRAVENOUS_CENTRAL

## 2019-08-23 MED ORDER — PRO-STAT SUGAR FREE PO LIQD
30.0000 mL | Freq: Every day | ORAL | Status: DC
  Administered 2019-08-23 (×3): 30 mL
  Filled 2019-08-23 (×3): qty 30

## 2019-08-23 MED ORDER — ARTIFICIAL TEARS OPHTHALMIC OINT
1.0000 "application " | TOPICAL_OINTMENT | Freq: Three times a day (TID) | OPHTHALMIC | Status: DC
  Administered 2019-08-23: 1 via OPHTHALMIC

## 2019-08-23 MED ORDER — SODIUM CHLORIDE 0.9 % IV SOLN
200.0000 mg | Freq: Once | INTRAVENOUS | Status: AC
  Administered 2019-08-23: 13:00:00 200 mg via INTRAVENOUS
  Filled 2019-08-23: qty 200

## 2019-08-23 MED ORDER — MIDAZOLAM BOLUS VIA INFUSION
1.0000 mg | INTRAVENOUS | Status: DC | PRN
  Filled 2019-08-23: qty 2

## 2019-08-23 MED ORDER — HEPARIN SODIUM (PORCINE) 1000 UNIT/ML DIALYSIS: 1000.0000 [IU] | INTRAMUSCULAR | Status: DC | PRN

## 2019-08-23 MED ORDER — SODIUM CHLORIDE 0.9 % IV SOLN: 250.0000 [IU]/h | INTRAVENOUS | Status: DC

## 2019-08-23 MED ORDER — SODIUM CHLORIDE 0.9 % IV SOLN
200.0000 mg | INTRAVENOUS | Status: DC
  Filled 2019-08-23: qty 200

## 2019-08-23 MED ORDER — MIDAZOLAM BOLUS VIA INFUSION
1.0000 mg | INTRAVENOUS | Status: DC | PRN
  Administered 2019-08-23: 2 mg via INTRAVENOUS
  Filled 2019-08-23: qty 2

## 2019-08-23 MED ORDER — PHENYLEPHRINE HCL-NACL 40-0.9 MG/250ML-% IV SOLN
0.0000 ug/min | INTRAVENOUS | Status: DC
  Administered 2019-08-23: 14:00:00 60 ug/min via INTRAVENOUS
  Administered 2019-08-23 (×2): 160 ug/min via INTRAVENOUS
  Filled 2019-08-23 (×4): qty 250

## 2019-08-23 MED ORDER — PRO-STAT SUGAR FREE PO LIQD
30.0000 mL | Freq: Two times a day (BID) | ORAL | Status: DC
  Administered 2019-08-23: 30 mL
  Filled 2019-08-23: qty 30

## 2019-08-23 MED ORDER — OXYCODONE HCL 5 MG/5ML PO SOLN
5.0000 mg | Freq: Four times a day (QID) | ORAL | Status: DC
  Administered 2019-08-23 – 2019-08-24 (×3): 5 mg
  Filled 2019-08-23 (×3): qty 5

## 2019-08-23 MED ORDER — CLONAZEPAM 1 MG PO TABS
1.0000 mg | ORAL_TABLET | Freq: Two times a day (BID) | ORAL | Status: DC
  Administered 2019-08-23 (×2): 1 mg
  Filled 2019-08-23 (×2): qty 1

## 2019-08-23 NOTE — Progress Notes (Signed)
 PROGRESS NOTE  Anae M Burzynski MRN:[id number] DOB: 11/30/1960 DOA: 08/02/2019  PCP: Stroud, Natalie M, FNP  Brief History/Interval Summary: 58-year-old with a history of systolic congestive heart failure (30-35% EF), HTN, HLD, obesity, and hypothyroidism who presented with complaints of severe shortness of breath with a cough which had been worsening over approximately 2 days.  Upon arrival in the ED she was found to be hypoxic at 88% on room air.  A chest x-ray suggested only pulmonary vascular congestion.  Shortly after her admission to [facility] she developed significant increased work of breathing with respiratory rates in the 40-50s.  Her Covid test subsequently returned positive and treatment was initiated with Decadron, remdesivir, and Actemra.   Reason for Visit: Acute respiratory failure with hypoxia.  Pneumonia due to COVID-19.  Consultants: Pulmonology.  Nephrology  Procedures:   Transthoracic echocardiogram 1. Left ventricular ejection fraction, by visual estimation, is 55 to 60%. The left ventricle has normal function. Normal left ventricular size. There is mildly increased left ventricular hypertrophy.  2. Left ventricular diastolic Doppler parameters are consistent with impaired relaxation pattern of LV diastolic filling.  3. Apical window is foreshortened.  4. Global right ventricle has normal systolic function.The right ventricular size is normal. No increase in right ventricular wall thickness.  5. Left atrial size was normal.  6. Right atrial size was normal.  7. Mild to moderate mitral annular calcification.  8. The mitral valve is grossly normal. Trace mitral valve regurgitation.  9. The tricuspid valve is normal in structure. Tricuspid valve regurgitation is trivial. 10. The aortic valve is tricuspid Aortic valve regurgitation was not visualized by color flow Doppler. Mild to moderate aortic valve sclerosis/calcification without any evidence of aortic stenosis. 11.  The pulmonic valve was not well visualized. Pulmonic valve regurgitation is not visualized by color flow Doppler. 12. The left ventricular function has improved.   Antibiotics: Anti-infectives (From admission, onward)   Start     Dose/Rate Route Frequency Ordered Stop   08/16/19 1000  remdesivir 100 mg in sodium chloride 0.9 % 250 mL IVPB     100 mg 500 mL/hr over 30 Minutes Intravenous Every 24 hours 08/15/19 1004 08/19/19 1141   08/15/19 1100  remdesivir 200 mg in sodium chloride 0.9 % 250 mL IVPB     200 mg 500 mL/hr over 30 Minutes Intravenous Once 08/15/19 1004 08/15/19 1200      Subjective/Interval History: Patient had to be intubated yesterday.  On mechanical ventilation this morning.  Sedated.   Assessment/Plan:  Acute Hypoxic Resp. Failure/Pneumonia due to COVID-19  Vent Mode: PRVC FiO2 (%):  [80 %-100 %] 90 % Set Rate:  [26 bmp] 26 bmp Vt Set:  [450 mL] 450 mL PEEP:  [14 cmH20-18 cmH20] 18 cmH20 Plateau Pressure:  [29 cmH20-39 cmH20] 39 cmH20     Component Value Date/Time   PHART 7.362 08/22/2019 2035   PCO2ART 44.9 08/22/2019 2035   PO2ART 100.0 08/22/2019 2035   HCO3 25.6 08/22/2019 2035   TCO2 27 08/22/2019 2035   ACIDBASEDEF 3.0 (H) 08/22/2019 1456   O2SAT 98.0 08/22/2019 2035    COVID-19 Labs  Recent Labs    08/21/19 0300 08/22/19 0523  DDIMER 3.19* 3.86*  CRP 2.2* 1.4*    Lab Results  Component Value Date   SARSCOV2NAA POSITIVE (A) 07/27/2019    Fever: Remains afebrile Oxygen requirements: Mechanical ventilation.  80% FiO2.   Antibacterials: Consider broad-spectrum antibiotics today Remdesivir: Completed course on 10/30 Steroids: Dexamethasone 6 mg daily   Diuretics: Lasix per nephrology Actemra: 1 dose given on 10/26 Convalescent plasma: Transfused on 10/28 DVT Prophylaxis: On full dose Lovenox  Research Studies: COVID PACT Trial  Patient had to be intubated yesterday due to decompensation.  She is on mechanical ventilation.   Pulmonology is following.  WBC noted to be significantly elevated this morning.  Procalcitonin 0.18.  However the rise in WBC is quite alarming.  May need to consider broad-spectrum antibiotics today.  From a COVID-19 standpoint she has completed course of remdesivir.  She remains on steroids.  She was given convalescent plasma and Actemra.  Inflammatory markers have improved.  CRP is down to 1.4.  D-dimer 3.86.  Hypotension/atrial fibrillation with RVR/occasional episodes of bradycardia She went into atrial fibrillation with RVR on 10/31.  She was started on amiodarone infusion and initially.  She converted to sinus rhythm.  Transition to oral amiodarone.  TSH was 1.46.  Yesterday she had episodes of hypotension requiring pressors.  Patient does have previous history of for systolic CHF although the recent echocardiogram showed an EF of 55%.  Another echocardiogram to be ordered today per CCM.  Amiodarone on hold.  She was on carvedilol which was also held due to bradycardia.  Acute kidney injury/hyperkalemia/normal anion gap metabolic acidosis Baseline creatinine around 1.7.  BUN and creatinine continue to increase.  She has not made much urine in the last 24 hours.  Nephrology is following.  Patient may require CRRT.    Acute on chronic systolic CHF/concern for acute diastolic CHF Based on echocardiogram from 2019 her EF was 35 to 40%.  Echocardiogram done during this hospitalization shows improvement in EF to 55%.  At the time of admission BNP was elevated at 314.  Entresto and carvedilol were stopped due to bradycardia and renal failure.  Monitor urine output.  Ins and outs.  Daily weights.  Limited echocardiogram to be ordered as discussed above.  Morbid obesity Estimated body mass index is 52.13 kg/m as calculated from the following:   Height as of this encounter: 5' 5" (1.651 m).   Weight as of this encounter: 142.1 kg.  Hyperlipidemia Continue statin.  Hypothyroidism Continue  Synthroid.  Diabetes mellitus type 2, uncontrolled with hyperglycemia HbA1c 7.0.  CBGs are reasonably well controlled.  Continue Lantus and SSI.  Essential hypertension Continue to monitor blood pressures.  FEN Not on any IV fluids.  Monitor electrolytes.  Tube feedings.   DVT Prophylaxis:  COVID PACT Trial PUD Prophylaxis: Continue Protonix Code Status: Full code Family Communication: Family being updated on a daily basis. Disposition Plan: Remain in ICU for now   Medications:  Scheduled: . amiodarone  200 mg Oral BID  . artificial tears  1 application Both Eyes Q8H  . chlorhexidine gluconate (MEDLINE KIT)  15 mL Mouth Rinse BID  . Chlorhexidine Gluconate Cloth  6 each Topical Daily  . docusate  100 mg Per Tube BID  . feeding supplement (PRO-STAT SUGAR FREE 64)  30 mL Per Tube BID  . feeding supplement (VITAL HIGH PROTEIN)  1,000 mL Per Tube Q24H  . furosemide  80 mg Intravenous Q8H  . insulin aspart  0-9 Units Subcutaneous TID WC  . insulin glargine  12 Units Subcutaneous QHS  . mouth rinse  15 mL Mouth Rinse BID  . mouth rinse  15 mL Mouth Rinse 10 times per day  . metolazone  10 mg Oral Daily  . polyethylene glycol  17 g Per Tube Daily   Continuous: . DOPamine 2.5 mcg/kg/min (  08/23/19 0700)  . famotidine (PEPCID) IV    . fentaNYL infusion INTRAVENOUS 200 mcg/hr (08/23/19 0700)  . midazolam 7 mg/hr (08/23/19 0700)  . norepinephrine (LEVOPHED) Adult infusion 20 mcg/min (08/23/19 0700)  . phenylephrine (NEO-SYNEPHRINE) Adult infusion 160 mcg/min (08/23/19 0808)  . vasopressin (PITRESSIN) infusion - *FOR SHOCK* 0.03 Units/min (08/23/19 0700)   YTK:[id number], albuterol, cisatracurium (NIMBEX) injection, fentaNYL, haloperidol lactate, heparin, lip balm, midazolam, sodium chloride, sodium chloride flush   Objective:  Vital Signs  Vitals:   08/23/19 0700 08/23/19 0710 08/23/19 0723 08/23/19 0800  BP: (!) 176/24 (!) 162/32  (!) 174/17  Pulse: (!) 59 (!) 57  60   Resp: (!) 26 (!) 26  (!) 26  Temp:   98.4 F (36.9 C)   TempSrc:   Axillary   SpO2: 100% 100%  99%  Weight:      Height:        Intake/Output Summary (Last 24 hours) at 08/23/2019 0953 Last data filed at 08/23/2019 0700 Gross per 24 hour  Intake 2502.13 ml  Output 500 ml  Net 2002.13 ml   Filed Weights   08/21/19 0310 08/22/19 0457 08/23/19 0500  Weight: (!) 137.8 kg (!) 137.3 kg (!) 142.1 kg    General appearance: Intubated and sedated.  Morbidly obese. Resp: Coarse breath sound bilaterally with crackles bilaterally.  No wheezing or rhonchi. Cardio: S1-S2 is normal regular.  No S3-S4.  No rubs murmurs or bruit GI: Abdomen is soft.  Nontender nondistended.  Bowel sounds are present normal.  No masses organomegaly Extremities: Minimal edema bilateral lower extremities Neurologic: Sedated      Lab Results:  Data Reviewed: I have personally reviewed following labs and imaging studies  CBC: Recent Labs  Lab 08/16/19 1158  08/19/19 0420 08/20/19 0320  08/21/19 0300 08/21/19 0813 08/22/19 0523 08/22/19 1456 08/22/19 2035 08/23/19 0515  WBC 12.8*   < > 14.2* 17.0*  --  18.9*  --  22.7*  --   --  36.3*  NEUTROABS 11.2*  --   --   --   --   --   --   --   --   --   --   HGB 13.3   < > 14.0 13.4   < > 14.5 15.3* 14.0 14.3 13.3 13.0  HCT 41.6   < > 44.4 42.5   < > 45.2 45.0 43.5 42.0 39.0 41.8  MCV 90.0   < > 89.5 90.6  --  88.1  --  88.8  --   --  92.3  PLT 154   < > 166 157  --  161  --  170  --   --  156   < > = values in this interval not displayed.    Basic Metabolic Panel: Recent Labs  Lab 08/19/19 0420 08/19/19 1654 08/20/19 0320  08/21/19 0300 08/21/19 0813 08/22/19 0523 08/22/19 0524 08/22/19 1456 08/22/19 2035 08/23/19 0515  NA 137 139 140   < > 143 142  --  141 142 145 144  K 5.4* 4.9 5.0   < > 4.2 4.2  --  4.1 4.6 4.3 4.8  CL 107 107 109  --  110  --   --  105  --   --  106  CO2 19* 19* 18*  --  18*  --   --  18*  --   --  20*  GLUCOSE 162*  217* 142*  --  89  --   --  96  --   --  210*  BUN 106* 107* 109*  --  107*  --   --  116*  --   --  116*  CREATININE 2.56* 2.01* 2.27*  --  1.88*  --   --  2.54*  --   --  4.00*  CALCIUM 8.2* 8.4* 8.6*  --  8.8*  --   --  8.9  --   --  8.0*  MG 2.8*  --  3.0*  --  2.9*  --  3.0*  --   --   --  2.7*  PHOS 5.7*  --   --   --  5.7*  --  5.7* 5.6*  --   --  9.8*  9.8*   < > = values in this interval not displayed.    GFR: Estimated Creatinine Clearance: 22.3 mL/min (A) (by C-G formula based on SCr of 4 mg/dL (H)).  Liver Function Tests: Recent Labs  Lab 08/16/19 1158 08/17/19 0415 08/18/19 0741 08/19/19 0420 08/21/19 0300 08/22/19 0524 08/23/19 0515  AST 31 38 53* 42*  --   --   --   ALT 19 19 23 24  --   --   --   ALKPHOS 54 58 61 66  --   --   --   BILITOT 0.9 0.6 1.0 0.9  --   --   --   PROT 7.7 7.8 7.8 7.6  --   --   --   ALBUMIN 3.1* 3.2* 3.2* 3.4* 3.5 3.8 3.3*    HbA1C: No results for input(s): HGBA1C in the last 72 hours.  CBG: Recent Labs  Lab 08/22/19 0804 08/22/19 1148 08/22/19 1854 08/22/19 2227 08/23/19 0721  GLUCAP 110* 147* 173* 168* 173*      Recent Results (from the past 240 hour(s))  SARS CORONAVIRUS 2 (TAT 6-24 HRS) Nasopharyngeal Nasopharyngeal Swab     Status: Abnormal   Collection Time: 08/06/2019  1:06 PM   Specimen: Nasopharyngeal Swab  Result Value Ref Range Status   SARS Coronavirus 2 POSITIVE (A) NEGATIVE Final    Comment: RESULT CALLED TO, READ BACK BY AND VERIFIED WITH: PAOKINGTON J, RN AT 0616 ON 08/15/2019 BY SAINVILUS S (NOTE) SARS-CoV-2 target nucleic acids are DETECTED. The SARS-CoV-2 RNA is generally detectable in upper and lower respiratory specimens during the acute phase of infection. Positive results are indicative of active infection with SARS-CoV-2. Clinical  correlation with patient history and other diagnostic information is necessary to determine patient infection status. Positive results do  not rule out bacterial  infection or co-infection with other viruses. The expected result is Negative. Fact Sheet for Patients: https://www.fda.gov/media/138098/download Fact Sheet for Healthcare Providers: https://www.fda.gov/media/138095/download This test is not yet approved or cleared by the [place] FDA and  has been authorized for detection and/or diagnosis of SARS-CoV-2 by FDA under an Emergency Use Authorization (EUA). This EUA will remain  in effect (meaning this test  can be used) for the duration of the COVID-19 declaration under Section 564(b)(1) of the Act, 21 U.S.C. section 360bbb-3(b)(1), unless the authorization is terminated or revoked sooner. Performed at [facility], [address] [place], [place] [address]   MRSA PCR Screening     Status: None   Collection Time: 08/15/19 10:59 AM   Specimen: Nasal Mucosa; Nasopharyngeal  Result Value Ref Range Status   MRSA by PCR NEGATIVE NEGATIVE Final    Comment:        The GeneXpert MRSA   Assay (FDA approved for NASAL specimens only), is one component of a comprehensive MRSA colonization surveillance program. It is not intended to diagnose MRSA infection nor to guide or monitor treatment for MRSA infections. Performed at [facility], [place] [address] [place], [place] [address]       Radiology Studies: Dg Chest Port 1 View  Result Date: 08/22/2019 CLINICAL DATA:  Intubation and line placement EXAM: PORTABLE CHEST 1 VIEW COMPARISON:  August 21, 2019 FINDINGS: Endotracheal tube is approximately 2 cm from the carina. Right IJ central line tip overlies the SVC. Enteric tube passes into the stomach with tip out of field of view. Lung volumes. Persistent diffuse pulmonary opacities similar to the prior study. No pneumothorax. Cardiomediastinal silhouette is not well evaluated. IMPRESSION: New lines/tubes as above.  No pneumothorax. Similar appearance of diffuse pulmonary opacities. Electronically Signed   By: Macy Mis M.D.   On: 08/22/2019 12:29       LOS: 9 days   Jon Lall [employer] on [email or web address]  08/23/2019, 9:53 AM

## 2019-08-23 NOTE — Progress Notes (Signed)
OT Cancellation Note  Patient Details Name: Brittney Ross MRN: [id number] DOB: 1961/06/12   Cancelled Treatment:    Reason Eval/Treat Not Completed: Medical issues which prohibited therapy; on vent, not medically stable. Will follow.   Lou Cal, OT [facility] Pager [phone] Office [phone]   [name] 08/23/2019, 8:07 AM

## 2019-08-23 NOTE — Progress Notes (Signed)
Patient ID: Brittney Ross, female   DOB: 11/16/1960, 58 y.o.   MRN: [id number] S: Clinical condition deteriorated significantly over the past 24 hours and is intubated and on pressors O:BP (!) 140/30   Pulse 62   Temp 98.4 F (36.9 C) (Axillary)   Resp (!) 25   Ht 5' 5" (1.651 m)   Wt (!) 142.1 kg   LMP 10/28/2011   SpO2 (!) 64%   BMI 52.13 kg/m   Intake/Output Summary (Last 24 hours) at 08/23/2019 1233 Last data filed at 08/23/2019 0700 Gross per 24 hour  Intake 2411.07 ml  Output 500 ml  Net 1911.07 ml   Intake/Output: I/O last 3 completed shifts: In: 2787.5 [I.V.:1687.6; IV Piggyback:1099.9] Out: 1400 [Urine:1400]  Intake/Output this shift:  No intake/output data recorded. Weight change: 4.8 kg Gen: intubated, sedated, obese AAF CVS: no rub Resp: cta Abd: +BS, soft, NT/ND Ext: 1+ edema  Recent Labs  Lab 08/17/19 0415  08/18/19 0741  08/19/19 0420 08/19/19 1654 08/20/19 0320 08/20/19 0324 08/21/19 0300 08/21/19 0813 08/22/19 0523 08/22/19 0524 08/22/19 1456 08/22/19 2035 08/23/19 0515  NA 139   < > 137   < > 137 139 140 142 143 142  --  141 142 145 144  K 4.9   < > 5.0   < > 5.4* 4.9 5.0 4.9 4.2 4.2  --  4.1 4.6 4.3 4.8  CL 104  --  105  --  107 107 109  --  110  --   --  105  --   --  106  CO2 21*  --  21*  --  19* 19* 18*  --  18*  --   --  18*  --   --  20*  GLUCOSE 157*  --  107*  --  162* 217* 142*  --  89  --   --  96  --   --  210*  BUN 70*  --  91*  --  106* 107* 109*  --  107*  --   --  116*  --   --  116*  CREATININE 1.88*  --  2.42*  --  2.56* 2.01* 2.27*  --  1.88*  --   --  2.54*  --   --  4.00*  ALBUMIN 3.2*  --  3.2*  --  3.4*  --   --   --  3.5  --   --  3.8  --   --  3.3*  CALCIUM 8.4*  --  8.3*  --  8.2* 8.4* 8.6*  --  8.8*  --   --  8.9  --   --  8.0*  PHOS 4.1  --  4.8*  --  5.7*  --   --   --  5.7*  --  5.7* 5.6*  --   --  9.8*  9.8*  AST 38  --  53*  --  42*  --   --   --   --   --   --   --   --   --   --   ALT 19  --  23  --  24  --    --   --   --   --   --   --   --   --   --    < > = values in this interval not displayed.   Liver Function Tests: Recent Labs  Lab 08/17/19   0932 08/18/19 0741 08/19/19 0420 08/21/19 0300 08/22/19 0524 08/23/19 0515  AST 38 53* 42*  --   --   --   ALT _0 --   --   --   ALKPHOS 58 61 66  --   --   --   BILITOT 0.6 1.0 0.9  --   --   --   PROT 7.8 7.8 7.6  --   --   --   ALBUMIN 3.2* 3.2* 3.4* 3.5 3.8 3.3*   No results for input(s): LIPASE, AMYLASE in the last 168 hours. No results for input(s): AMMONIA in the last 168 hours. CBC: Recent Labs  Lab 08/19/19 0420 08/20/19 0320  08/21/19 0300  08/22/19 0523 08/22/19 1456 08/22/19 2035 08/23/19 0515  WBC 14.2* 17.0*  --  18.9*  --  22.7*  --   --  36.3*  HGB 14.0 13.4   < > 14.5   < > 14.0 14.3 13.3 13.0  HCT 44.4 42.5   < > 45.2   < > 43.5 42.0 39.0 41.8  MCV 89.5 90.6  --  88.1  --  88.8  --   --  92.3  PLT 166 157  --  161  --  170  --   --  156   < > = values in this interval not displayed.   Cardiac Enzymes: No results for input(s): CKTOTAL, CKMB, CKMBINDEX, TROPONINI in the last 168 hours. CBG: Recent Labs  Lab 08/22/19 1148 08/22/19 1854 08/22/19 2227 08/23/19 0721 08/23/19 1153  GLUCAP 147* 173* 168* 173* 187*    Iron Studies: No results for input(s): IRON, TIBC, TRANSFERRIN, FERRITIN in the last 72 hours. Studies/Results: Dg Chest Port 1 View  Result Date: 08/22/2019 CLINICAL DATA:  Intubation and line placement EXAM: PORTABLE CHEST 1 VIEW COMPARISON:  August 21, 2019 FINDINGS: Endotracheal tube is approximately 2 cm from the carina. Right IJ central line tip overlies the SVC. Enteric tube passes into the stomach with tip out of field of view. Lung volumes. Persistent diffuse pulmonary opacities similar to the prior study. No pneumothorax. Cardiomediastinal silhouette is not well evaluated. IMPRESSION: New lines/tubes as above.  No pneumothorax. Similar appearance of diffuse pulmonary opacities.  Electronically Signed   By: Macy Mis M.D.   On: 08/22/2019 12:29   . artificial tears  1 application Both Eyes T5T  . chlorhexidine gluconate (MEDLINE KIT)  15 mL Mouth Rinse BID  . Chlorhexidine Gluconate Cloth  6 each Topical Daily  . clonazePAM  1 mg Per Tube BID  . docusate  100 mg Per Tube BID  . feeding supplement (PRO-STAT SUGAR FREE 64)  30 mL Per Tube BID  . feeding supplement (VITAL HIGH PROTEIN)  1,000 mL Per Tube Q24H  . furosemide  80 mg Intravenous Q8H  . insulin aspart  0-9 Units Subcutaneous TID WC  . insulin glargine  12 Units Subcutaneous QHS  . mouth rinse  15 mL Mouth Rinse BID  . mouth rinse  15 mL Mouth Rinse 10 times per day  . metolazone  10 mg Oral Daily  . oxyCODONE  5 mg Per Tube Q6H  . polyethylene glycol  17 g Per Tube Daily    BMET    Component Value Date/Time   NA 144 08/23/2019 0515   NA 139 04/13/2019 0720   K 4.8 08/23/2019 0515   CL 106 08/23/2019 0515   CO2 20 (L) 08/23/2019 0515   GLUCOSE 210 (H) 08/23/2019  0515   BUN 116 (H) 08/23/2019 0515   BUN 13 04/13/2019 0720   CREATININE 4.00 (H) 08/23/2019 0515   CREATININE 0.82 07/17/2017 1121   CALCIUM 8.0 (L) 08/23/2019 0515   GFRNONAA 12 (L) 08/23/2019 0515   GFRNONAA 81 07/17/2017 1121   GFRAA 14 (L) 08/23/2019 0515   GFRAA 93 07/17/2017 1121   CBC    Component Value Date/Time   WBC 36.3 (H) 08/23/2019 0515   RBC 4.53 08/23/2019 0515   HGB 13.0 08/23/2019 0515   HCT 41.8 08/23/2019 0515   PLT 156 08/23/2019 0515   MCV 92.3 08/23/2019 0515   MCH 28.7 08/23/2019 0515   MCHC 31.1 08/23/2019 0515   RDW 15.4 08/23/2019 0515   LYMPHSABS 0.9 08/16/2019 1158   MONOABS 0.6 08/16/2019 1158   EOSABS 0.0 08/16/2019 1158   BASOSABS 0.0 08/16/2019 1158    Assessment/Plan:  1. AKI- non-oliguric and multifactorial:  AKI in setting of covid infection, hypotension, IV diuresis with concomitant ARB use and intravascular volume depletion.  Now with VDRF and sepsis requiring  pressors 1. Discussed case with Dr. [place] Bells and will plan to initiate CVVHD with UF today after HD catheter is placed.  Will use 4K/2.5Ca for all fluids and heparin for anticoagulation but follow plts and H/H. 2. Agree with aggressive measures for now but may need to discuss goals of care if she continues to deteriorate. 2. Covid+ PNA with hypoxemia and now with worsening respiratory status s/p intubation per PCCM.  Severe diffuse bilateral infiltrates on CXR.  On decadron which is also contributing to azotemia. 3. Sepsis - now on 3 pressors 4. Atrial fib- new with RVR on amio and lovenox. 5. CHF- EF 30% 6. HTN- meds on hold due to low BP 7. VDRF- per [place], MD [facility] [phone]

## 2019-08-23 NOTE — Progress Notes (Signed)
NAME:  Brittney Ross, MRN:  [id number], DOB:  Nov 20, 1960, LOS: 9 ADMISSION DATE:  08/09/2019, CONSULTATION DATE:  10/25 REFERRING MD:  Alfredia Ferguson, CHIEF COMPLAINT:  Dyspnea   Brief History   58 y/o female with chronic systolic heart failure admitted on 10/25 to the [facility] ICU for COVID pneumonia causing acute respiratory failure with hypoxemia.  Moved to the [facility] ICU.  Past Medical History  Chronic systolic heart failure Hypertension Hypothyroidism Hyperlipidemia Obesity  Significant Hospital Events   10/26 transferred to the ICU 11/2 intubated 11/3 CVVHD  Consults:  PCCM Nephrology  Procedures:  11/2 ETT >  11/2 R IJ HD cath >  11/3 L radial arterial line >   Significant Diagnostic Tests:  02/2018 TTE EF 35 to 40% 07/2019 TTE> LVEF 55-60%, mild LVH, RV normal size and function  Micro Data:  10/25 SARS COV 2 >   11/3 blood >  11/3 resp >   Antimicrobials:  10/26 redmesivir > 10/30 10/26 decadron >  10/26 tocilizumab   11/3 vanc >  11/3 cefepime >  11/3 anidulofungin >   Interim history/subjective:   Intubated yesterday  Objective   Blood pressure (!) 162/32, pulse (!) 57, temperature 98.4 F (36.9 C), temperature source Axillary, resp. rate (!) 26, height 5\' 5"  (1.651 m), weight (!) 142.1 kg, last menstrual period 10/28/2011, SpO2 100 %.    Vent Mode: PRVC FiO2 (%):  [80 %-100 %] 90 % Set Rate:  [26 bmp] 26 bmp Vt Set:  [450 mL] 450 mL PEEP:  [14 cmH20-18 cmH20] 18 cmH20 Plateau Pressure:  [29 cmH20-39 cmH20] 39 cmH20   Intake/Output Summary (Last 24 hours) at 08/23/2019 0800 Last data filed at 08/23/2019 0700 Gross per 24 hour  Intake 2523.69 ml  Output 500 ml  Net 2023.69 ml   Filed Weights   08/21/19 0310 08/22/19 0457 08/23/19 0500  Weight: (!) 137.8 kg (!) 137.3 kg (!) 142.1 kg    Examination:  General:  In bed on vent HENT: NCAT ETT in place PULM: CTA B, vent supported breathing CV: RRR, no mgr GI: BS+, soft, nontender MSK: normal bulk and  tone Neuro: sedated on vent   11/1 CXR images personally reviewed showing bilateral interstitial infiltrates   Resolved Hospital Problem list     Assessment & Plan:   Acute respiratory failure with hypoxemia due to COVID 19 pneumonia Worsening, intubated on 11/2 Family OK with short term intubation, 5-7 days Continue mechanical ventilation per ARDS protocol Target TVol 6-8cc/kgIBW Target Plateau Pressure < 30cm H20 Target driving pressure less than 15 cm of water Target PaO2 55-65: titrate PEEP/FiO2 per protocol As long as PaO2 to FiO2 ratio is less than 1:150 position in prone position for 16 hours a day Check CVP daily if CVL in place Target CVP less than 4, diurese as necessary Ventilator associated pneumonia prevention protocol 11/3 plan: wean vent per protocol, prone after CRRT  AKI with elevated BUN: worse, oliguric Monitor BMET and UOP Replace electrolytes as needed Start CRRT  Atrial fibrillation Tele Amiodarone Keep K > 4, Mg > 2  Shock: septic? Cardiogenic? PE? Keep pH > 7.2 Check echo again Continue vancomycin and cefepime Add anidulofungin Check cvp Continue levophed vasopressin, neo for MAP > 65   Best practice:  Diet: liquid diet Pain/Anxiety/Delirium protocol (if indicated): n/a VAP protocol (if indicated): n/a DVT prophylaxis: full dose lovenox GI prophylaxis: n/a Glucose control: per TRH Mobility: bed rest Code Status: limited code, OK with short term intubation if  necessary, but doesn't want CPR in the event of a cardiac arrest Family Communication: I called and updated her husbadn on 11/3 Disposition: remain in ICU  Labs   CBC: Recent Labs  Lab 08/16/19 1158  08/19/19 0420 08/20/19 0320  08/21/19 0300 08/21/19 0813 08/22/19 0523 08/22/19 1456 08/22/19 2035 08/23/19 0515  WBC 12.8*   < > 14.2* 17.0*  --  18.9*  --  22.7*  --   --  36.3*  NEUTROABS 11.2*  --   --   --   --   --   --   --   --   --   --   HGB 13.3   < > 14.0 13.4    < > 14.5 15.3* 14.0 14.3 13.3 13.0  HCT 41.6   < > 44.4 42.5   < > 45.2 45.0 43.5 42.0 39.0 41.8  MCV 90.0   < > 89.5 90.6  --  88.1  --  88.8  --   --  92.3  PLT 154   < > 166 157  --  161  --  170  --   --  156   < > = values in this interval not displayed.    Basic Metabolic Panel: Recent Labs  Lab 08/18/19 0741  08/19/19 0420 08/19/19 1654 08/20/19 0320  08/21/19 0300 08/21/19 0813 08/22/19 0523 08/22/19 0524 08/22/19 1456 08/22/19 2035 08/23/19 0515  NA 137   < > 137 139 140   < > 143 142  --  141 142 145 144  K 5.0   < > 5.4* 4.9 5.0   < > 4.2 4.2  --  4.1 4.6 4.3 4.8  CL 105  --  107 107 109  --  110  --   --  105  --   --  106  CO2 21*  --  19* 19* 18*  --  18*  --   --  18*  --   --  20*  GLUCOSE 107*  --  162* 217* 142*  --  89  --   --  96  --   --  210*  BUN 91*  --  106* 107* 109*  --  107*  --   --  116*  --   --  116*  CREATININE 2.42*  --  2.56* 2.01* 2.27*  --  1.88*  --   --  2.54*  --   --  4.00*  CALCIUM 8.3*  --  8.2* 8.4* 8.6*  --  8.8*  --   --  8.9  --   --  8.0*  MG 2.6*  --  2.8*  --  3.0*  --  2.9*  --  3.0*  --   --   --   --   PHOS 4.8*  --  5.7*  --   --   --  5.7*  --  5.7* 5.6*  --   --  9.8*   < > = values in this interval not displayed.   GFR: Estimated Creatinine Clearance: 22.3 mL/min (A) (by C-G formula based on SCr of 4 mg/dL (H)). Recent Labs  Lab 08/20/19 0320 08/21/19 0300 08/22/19 0523 08/23/19 0515  PROCALCITON  --   --   --  0.18  WBC 17.0* 18.9* 22.7* 36.3*    Liver Function Tests: Recent Labs  Lab 08/16/19 1158 08/17/19 0415 08/18/19 0741 08/19/19 0420 08/21/19 0300 08/22/19 0524 08/23/19 0515  AST 31 38  53* 42*  --   --   --   ALT 19 19 23 24   --   --   --   ALKPHOS 54 58 61 66  --   --   --   BILITOT 0.9 0.6 1.0 0.9  --   --   --   PROT 7.7 7.8 7.8 7.6  --   --   --   ALBUMIN 3.1* 3.2* 3.2* 3.4* 3.5 3.8 3.3*   No results for input(s): LIPASE, AMYLASE in the last 168 hours. No results for input(s): AMMONIA  in the last 168 hours.  ABG    Component Value Date/Time   PHART 7.362 08/22/2019 2035   PCO2ART 44.9 08/22/2019 2035   PO2ART 100.0 08/22/2019 2035   HCO3 25.6 08/22/2019 2035   TCO2 27 08/22/2019 2035   ACIDBASEDEF 3.0 (H) 08/22/2019 1456   O2SAT 98.0 08/22/2019 2035     Coagulation Profile: Recent Labs  Lab 08/19/19 0420 08/20/19 0320 08/21/19 0300 08/22/19 0523 08/23/19 0515  INR 1.1 1.2 1.2 1.2 1.3*    Cardiac Enzymes: No results for input(s): CKTOTAL, CKMB, CKMBINDEX, TROPONINI in the last 168 hours.  HbA1C: Hemoglobin A1C  Date/Time Value Ref Range Status  01/25/2019 08:15 AM 6.6 (A) 4.0 - 5.6 % Final  07/28/2018 08:15 AM 6.6 (A) 4.0 - 5.6 % Final   Hgb A1c MFr Bld  Date/Time Value Ref Range Status  08/06/2019 05:02 PM 7.0 (H) 4.8 - 5.6 % Final    Comment:    (NOTE) Pre diabetes:          5.7%-6.4% Diabetes:              >6.4% Glycemic control for   <7.0% adults with diabetes     CBG: Recent Labs  Lab 08/22/19 0804 08/22/19 1148 08/22/19 1854 08/22/19 2227 08/23/19 0721  GLUCAP 110* 147* 173* 168* 173*     Critical care time: 40 minutes    Roselie Awkward, MD [place] PCCM Pager: [phone] Cell: [phone] If no response, call [phone]

## 2019-08-23 NOTE — Progress Notes (Addendum)
[facility] PCCM  Worsening hypoxemia and hemodynamics, better with paralytics Has received multiple prn paralytics Start continuous paralytics No proning with hemodynamic instability.  Roselie Awkward, MD [place] PCCM Pager: [phone] Cell: [phone] If no response, call [phone]

## 2019-08-23 NOTE — Progress Notes (Signed)
Pharmacy Antibiotic Note  TURA PAULHUS is a 58 y.o. female admitted on 08/13/2019.   Pharmacy has been consulted for Cefepime and Vancomycin dosing for HCAP. SCr 4, starting CRRT on 11/3 PM.  Plan: Cefepime 2g IV q12h Vancomycin 2g IV x1 then 1500 mg IV q24h.   Height: 5\' 5"  (165.1 cm) Weight: (!) 313 lb 4.4 oz (142.1 kg) IBW/kg (Calculated) : 57  Temp (24hrs), Avg:97.8 F (36.6 C), Min:97.1 F (36.2 C), Max:98.5 F (36.9 C)  Recent Labs  Lab 08/19/19 0420 08/19/19 1654 08/20/19 0320 08/21/19 0300 08/22/19 0523 08/22/19 0524 08/23/19 0515  WBC 14.2*  --  17.0* 18.9* 22.7*  --  36.3*  CREATININE 2.56* 2.01* 2.27* 1.88*  --  2.54* 4.00*    Estimated Creatinine Clearance: 22.3 mL/min (A) (by C-G formula based on SCr of 4 mg/dL (H)).    No Known Allergies  Antimicrobials this admission: 11/3 Vancomycin >>  11/3 Cefepime >> 11/3 anidulafungin >>  Dose adjustments this admission:  Microbiology results: 10/25 SARS CoV2: positive 10/26 MRSA PCR: negative 11/3 BCx:  11/3 tracheal aspirate:    Thank you for allowing pharmacy to be a part of this patient's care.  Gretta Arab PharmD, BCPS Clinical pharmacist phone 7am- 5pm: [phone] 08/23/2019 10:16 AM

## 2019-08-23 NOTE — Progress Notes (Signed)
  PT Cancellation Note  Patient Details Name: SALONI HEDGEPETH MRN: [id number] DOB: 07-03-1961   Cancelled Treatment:    Reason Eval/Treat Not Completed: Medical issues which prohibited therapy, on vent, not medically stable .   Claretha Cooper 08/23/2019, 6:59 AM [place]  Office [phone]

## 2019-08-23 NOTE — Progress Notes (Signed)
  Echocardiogram 2D Echocardiogram limited has been performed.  Darlina Sicilian M 08/23/2019, 10:06 AM

## 2019-08-23 NOTE — Progress Notes (Signed)
[facility] Progress Note Patient Name: NIKOLA RENCK DOB: August 30, 1961 MRN: [id number]   Date of Service  08/23/2019  HPI/Events of Note  Hypotension - BP = 93/44.  eICU Interventions  Will restart Phenylephrine IV infusion. Titrate to MAP >= 65.      Intervention Category Major Interventions: Hypotension - evaluation and management  Oaklan Persons Eugene 08/23/2019, 3:24 AM

## 2019-08-23 NOTE — Progress Notes (Signed)
Pt's son and daughter in law called and given update on pt's status, including pt's need for mechanical ventilation and CRRT. Answered all questions.

## 2019-08-23 NOTE — Progress Notes (Addendum)
ANTICOAGULATION CONSULT NOTE - Follow Up Consult  Pharmacy Consult for Heparin Indication: atrial fibrillation  No Known Allergies  Patient Measurements: Height: 5' 5"  (165.1 cm) Weight: (!) 313 lb 4.4 oz (142.1 kg) IBW/kg (Calculated) : 57 Heparin Dosing Weight: 92 kg  Vital Signs: Temp: 98.4 F (36.9 C) (11/03 0723) Temp Source: Axillary (11/03 0723) BP: 162/32 (11/03 0710) Pulse Rate: 57 (11/03 0710)  Labs: Recent Labs    08/21/19 0300  08/22/19 0523 08/22/19 0524 08/22/19 1456 08/22/19 2035 08/23/19 0515  HGB 14.5   < > 14.0  --  14.3 13.3 13.0  HCT 45.2   < > 43.5  --  42.0 39.0 41.8  PLT 161  --  170  --   --   --  156  LABPROT 15.2  --  15.5*  --   --   --  15.8*  INR 1.2  --  1.2  --   --   --  1.3*  HEPARINUNFRC  --   --   --   --   --   --  >2.20*  HEPRLOWMOCWT >2.00  --   --   --   --   --   --   CREATININE 1.88*  --   --  2.54*  --   --  4.00*   < > = values in this interval not displayed.    Estimated Creatinine Clearance: 22.3 mL/min (A) (by C-G formula based on SCr of 4 mg/dL (H)).   Medications:  Scheduled:  . amiodarone  200 mg Oral BID  . artificial tears  1 application Both Eyes L4Y  . chlorhexidine gluconate (MEDLINE KIT)  15 mL Mouth Rinse BID  . Chlorhexidine Gluconate Cloth  6 each Topical Daily  . docusate sodium  100 mg Oral BID  . etomidate  20 mg Intravenous Once  . feeding supplement (NEPRO CARB STEADY)  237 mL Oral TID BM  . fentaNYL (SUBLIMAZE) injection  100 mcg Intravenous Once  . fentaNYL (SUBLIMAZE) injection  50 mcg Intravenous Once  . furosemide  80 mg Intravenous Q8H  . insulin aspart  0-9 Units Subcutaneous TID WC  . insulin glargine  12 Units Subcutaneous QHS  . mouth rinse  15 mL Mouth Rinse BID  . mouth rinse  15 mL Mouth Rinse 10 times per day  . metolazone  10 mg Oral Daily  . polyethylene glycol  17 g Oral Daily  . sodium bicarbonate      . sodium bicarbonate  50 mEq Intravenous Once   Infusions:  .  dexmedetomidine (PRECEDEX) IV infusion 28.121 mcg/kg/hr (08/20/19 2325)  . DOPamine 2.5 mcg/kg/min (08/23/19 0700)  . famotidine (PEPCID) IV    . fentaNYL infusion INTRAVENOUS 200 mcg/hr (08/23/19 0700)  . heparin 1,200 Units/hr (08/23/19 0700)  . midazolam 7 mg/hr (08/23/19 0700)  . norepinephrine (LEVOPHED) Adult infusion 20 mcg/min (08/23/19 0700)  . phenylephrine (NEO-SYNEPHRINE) Adult infusion 190 mcg/min (08/23/19 0700)  . sodium chloride    . sodium chloride    . sodium chloride    . vasopressin (PITRESSIN) infusion - *FOR SHOCK* 0.03 Units/min (08/23/19 0700)    Assessment: 58 YOF with COVID-19 associated hypoxia enrolled in treatment arm of COVID-PACT trial on Lovenox treatment arm. Given new AFib, pharmacy consulted to start IV heparin, then changed to Lovenox d/t lack of IV access.  She has worsening AKI and an HD cath was placed on 11/2.  - Lovenox peak level was supratherapeutic at >2 on 11/1 on  Lovenox 130 mg twice daily. Lovenox was reduced to 70m BID. - Most recent Lovenox given 11/2 at 11:15 AM.  - Heparin Level > 2.2 (drawn from midline on left, heparin infusing through right IJ) - CBC: Hgb and Plt stable, WNL - SCr up to 4  Goal of Therapy:  Heparin level 0.3-0.7 units/ml Monitor platelets by anticoagulation protocol: Yes   Plan:  HOLD heparin for one hour Re-check HL at 0900 before resuming. Daily heparin level, CBC, s/s bleeding.  CGretta ArabPharmD, BCPS Clinical pharmacist phone 7am- 5pm: [phone]/12/2018 7:58 AM  Addendum: Repeat HL at 09:20 remains >2.2 Repeat HL at 11:50 remains > 2.2 Suspect that pt is not clearing previous Lovenox doses (last given 11/2 AM) since the heparin infusion was off at 08:00 this morning and HL remain supratherapeutic. Rn reports small/moderate oral bleeding and pink tinge to tube feeds returned on LIS.  MD is aware.  No further changes at this time.  RN to monitor. Plan:  Continue to hold heparin and recheck HL  at 20:00.  CGretta ArabPharmD, BCPS Clinical pharmacist phone 7am- 5pm: [phone]/12/2018 4:56 PM

## 2019-08-23 NOTE — Progress Notes (Signed)
Nutrition Follow-up / Consult  RD working remotely.  DOCUMENTATION CODES:   Morbid obesity  INTERVENTION:    Vital 1.5 at 40 ml/h (960 ml per day)   Pro-stat 30 ml 5 times per day   Provides 1940 kcal, 140 gm protein, 733 ml free water daily  NUTRITION DIAGNOSIS:   Inadequate oral intake related to inability to eat as evidenced by NPO status.  GOAL:   Provide needs based on ASPEN/SCCM guidelines  MONITOR:   Vent status, TF tolerance, Skin, Labs, I & O's  REASON FOR ASSESSMENT:   Ventilator, Consult Enteral/tube feeding initiation and management  ASSESSMENT:   58 yo female acute respiratory failure due to COVID 19 infection, AKI.  PMH includes CHF, HTN, HLD, obesity.  10/25 Admit 10/26 Transferred to ICU 10/28 Diet downgraded to CL 11/02 Intubated 11/03 CRRT initiated  Intake since admission has been very minimal. Received MD Consult for TF initiation and management. Plans to prone patient after CRRT started today.   Patient is currently intubated on ventilator support, requiring 3 pressors and dopamine drip. MV: 11.4 L/min Temp (24hrs), Avg:97.8 F (36.6 C), Min:97.1 F (36.2 C), Max:98.5 F (36.9 C)   Labs: phosphorus 9.8, BUN 116, Creatinine 4, magnesium 2.7  CBGs 173-187  Meds: colace, lasix, ss novolog, lantus, miralax, levophed, vasopressin, neosynephrine, nimbex.    Diet Order:   Diet Order    None      EDUCATION NEEDS:   Not appropriate for education at this time  Skin:  Skin Assessment: Reviewed RN Assessment  Last BM:  10/31 type 7  Height:   Ht Readings from Last 1 Encounters:  08/22/19 5\' 5"  (1.651 m)    Weight:   Wt Readings from Last 1 Encounters:  08/23/19 (!) 142.1 kg  08/19/19 129.2 kg (lowest wt since admission)  Ideal Body Weight:  56.8 kg  BMI:  Body mass index is 52.13 kg/m.  Estimated Nutritional Needs:   Kcal:  2055  Protein:  125-140 gm  Fluid:  >/=1.7 L   Molli Barrows, RD, LDN,  CNSC Pager [phone] After Hours Pager [phone]

## 2019-08-23 NOTE — Progress Notes (Signed)
Restarted Dopamine for HR in 30s causing pt to have increased ectopy. Levo weaned down and Phenylephrine restarted per CCMD.

## 2019-08-24 DIAGNOSIS — N179 Acute kidney failure, unspecified: Secondary | ICD-10-CM

## 2019-08-24 DIAGNOSIS — J80 Acute respiratory distress syndrome: Secondary | ICD-10-CM

## 2019-08-24 LAB — POCT I-STAT 7, (LYTES, BLD GAS, ICA,H+H)
Acid-Base Excess: 27 mmol/L — ABNORMAL HIGH (ref 0.0–2.0)
Bicarbonate: 50.5 mmol/L — ABNORMAL HIGH (ref 20.0–28.0)
Calcium, Ion: 0.68 mmol/L — CL (ref 1.15–1.40)
HCT: 21 % — ABNORMAL LOW (ref 36.0–46.0)
Hemoglobin: 7.1 g/dL — ABNORMAL LOW (ref 12.0–15.0)
O2 Saturation: 95 %
Patient temperature: 97
Potassium: 3.4 mmol/L — ABNORMAL LOW (ref 3.5–5.1)
Sodium: 168 mmol/L (ref 135–145)
TCO2: >50 mmol/L — ABNORMAL HIGH (ref 22–32)
pCO2 arterial: 42.8 mmHg (ref 32.0–48.0)
pH, Arterial: 7.678 (ref 7.350–7.450)
pO2, Arterial: 59 mmHg — ABNORMAL LOW (ref 83.0–108.0)

## 2019-08-24 MED ORDER — EPINEPHRINE 1 MG/10ML IJ SOSY
PREFILLED_SYRINGE | INTRAMUSCULAR | Status: AC
  Filled 2019-08-24: qty 10

## 2019-08-27 LAB — CULTURE, RESPIRATORY W GRAM STAIN

## 2019-08-28 LAB — CULTURE, BLOOD (ROUTINE X 2)
Culture: NO GROWTH
Culture: NO GROWTH
Special Requests: ADEQUATE
Special Requests: ADEQUATE

## 2019-08-30 MED FILL — Medication: Qty: 1 | Status: AC

## 2019-09-20 NOTE — Progress Notes (Signed)
136ml of dopamine, 29ml of vasopressin, 241ml of fentanyl, 78ml of versed, 147ml of Nimbex wasted with Patient's RN Sabra Heck  Margaret Pyle, RN

## 2019-09-20 NOTE — Progress Notes (Signed)
Patient's husband informed that patient's belongings will be sent down to morgue with patient.  Margaret Pyle, RN

## 2019-09-20 NOTE — Death Summary Note (Signed)
Death Summary  Brittney Ross [id number] DOB: [birth year] DOA: 2019/08/30  PCP: Azzie Glatter, FNP  Admit date: 2019-08-30 Date of Death: 09-Sep-2019 Time of Death: 02:49 am  Notification: Azzie Glatter, FNP notified of death of September 09, 2019   History of present illness:  Brittney Ross is a 58 y.o. female with a history of obesity (BMI 43), chronic systolic CHF, hypertension, and hypothyroidism, who presented to the emergency department on 30-Aug-2019 with 2 days of shortness of breath and cough, was found to be tachypneic and hypoxic on room air and was admitted to the hospital.  She was found to be positive for COVID-19, there was suspicion that she was also suffering from acute on chronic CHF, she was diuresed, treated with Decadron, remdesivir, and Actemra, and developed acute kidney injury.  She continued to worsen after admission, was transferred to ICU on 08/15/2019, and she was intubated on 08/22/2019.  She also developed shock and was started on Levophed on 08/22/2019, eventually requiring vasopressin and a third pressor.  Renal function did not recover and she was started on CVVHD on 08/23/2019.    Early morning of September 09, 2019, patient went into ventricular fibrillation.  Based on prior discussions (such as that documented on 08/18/2019), the patient did not want chest compressions or defibrillation.  CODE BLUE was activated and critical care notified via E link.  Patient was treated with multiple doses of epinephrine, bicarbonate, and also amiodarone.  A perfusing rhythm was never achieved.  Time of death was called at 2:49 AM.  Patient's husband was notified by phone, is understandably distraught, but also expressed his gratitude for the nurses and rest of the team caring for his wife.   Final Diagnoses:  1.   COVID-19 pneumonia  2.   Acute hypoxic respiratory failure  3.   Atrial fibrillation, paroxysmal  4.   Acute kidney injury  5.   Chronic systolic CHF    The results of  significant diagnostics from this hospitalization (including imaging, microbiology, ancillary and laboratory) are listed below for reference.    Significant Diagnostic Studies: Dg Chest Port 1 View  Result Date: 08/22/2019 CLINICAL DATA:  Intubation and line placement EXAM: PORTABLE CHEST 1 VIEW COMPARISON:  August 21, 2019 FINDINGS: Endotracheal tube is approximately 2 cm from the carina. Right IJ central line tip overlies the SVC. Enteric tube passes into the stomach with tip out of field of view. Lung volumes. Persistent diffuse pulmonary opacities similar to the prior study. No pneumothorax. Cardiomediastinal silhouette is not well evaluated. IMPRESSION: New lines/tubes as above.  No pneumothorax. Similar appearance of diffuse pulmonary opacities. Electronically Signed   By: Macy Mis M.D.   On: 08/22/2019 12:29   Dg Chest Port 1 View  Result Date: 08/21/2019 CLINICAL DATA:  Positive for COVID-19.  Shortness of breath. EXAM: PORTABLE CHEST 1 VIEW COMPARISON:  August 18, 2019 FINDINGS: Stable cardiomediastinal silhouette. Diffuse bilateral pulmonary opacities are identified. The opacities are less dense in the interval, possibly due to improved lung volumes. IMPRESSION: Diffuse bilateral pulmonary opacities remain, likely due to the patient's COVID-19 diagnosis. While the opacities remain diffuse and significant, they are less dense in the interval, possibly due to better lung volumes. Electronically Signed   By: Dorise Bullion III M.D   On: 08/21/2019 11:08   Dg Chest Port 1 View  Result Date: 08/18/2019 CLINICAL DATA:  ET tube EXAM: PORTABLE CHEST 1 VIEW COMPARISON:  08/16/2019 FINDINGS: No endotracheal tube visualized. Very low lung volumes and diffuse  bilateral airspace disease again noted. Minimal improvement since prior scabs that slight improvement in aeration since prior study. Suspect cardiomegaly. Vascular congestion. No visible effusions or pneumothorax. IMPRESSION: Very low lung  volumes and severe diffuse bilateral airspace disease with minimal improved since prior study. Cardiomegaly, vascular congestion. Electronically Signed   By: Rolm Baptise M.D.   On: 08/18/2019 08:44   Dg Chest Port 1 View  Result Date: 08/16/2019 CLINICAL DATA:  Shortness of breath. EXAM: PORTABLE CHEST 1 VIEW COMPARISON:  August 14, 2019. FINDINGS: Stable cardiomegaly. Exam is limited due to body habitus. No pneumothorax is noted. Significantly increased opacities are noted throughout both lungs concerning for worsening edema or pneumonia. Pleural effusion cannot be excluded. Visualized portion of bony thorax is unremarkable. IMPRESSION: Exam is limited due to body habitus. Significant increased bilateral lung opacities are noted concerning for worsening edema or pneumonia. Electronically Signed   By: Marijo Conception M.D.   On: 08/16/2019 07:36   Dg Chest Port 1 View  Result Date: 08/13/2019 CLINICAL DATA:  Shortness of breath for 2 days EXAM: PORTABLE CHEST 1 VIEW COMPARISON:  07/05/2017 FINDINGS: Bilateral interstitial thickening. No pleural effusion or pneumothorax. No focal consolidation. Stable cardiomegaly. No acute osseous abnormality. IMPRESSION: Cardiomegaly with mild pulmonary vascular congestion. Electronically Signed   By: Kathreen Devoid   On: 08/15/2019 11:48    Microbiology: Recent Results (from the past 240 hour(s))  SARS CORONAVIRUS 2 (TAT 6-24 HRS) Nasopharyngeal Nasopharyngeal Swab     Status: Abnormal   Collection Time: 08/05/2019  1:06 PM   Specimen: Nasopharyngeal Swab  Result Value Ref Range Status   SARS Coronavirus 2 POSITIVE (A) NEGATIVE Final    Comment: RESULT CALLED TO, READ BACK BY AND VERIFIED WITH: Reita Chard, RN AT [phone] ON 08/15/2019 BY SAINVILUS S (NOTE) SARS-CoV-2 target nucleic acids are DETECTED. The SARS-CoV-2 RNA is generally detectable in upper and lower respiratory specimens during the acute phase of infection. Positive results are indicative of  active infection with SARS-CoV-2. Clinical  correlation with patient history and other diagnostic information is necessary to determine patient infection status. Positive results do  not rule out bacterial infection or co-infection with other viruses. The expected result is Negative. Fact Sheet for Patients: SugarRoll.be Fact Sheet for Healthcare Providers: https://www.woods-mathews.com/ This test is not yet approved or cleared by the [place] FDA and  has been authorized for detection and/or diagnosis of SARS-CoV-2 by FDA under an Emergency Use Authorization (EUA). This EUA will remain  in effect (meaning this test  can be used) for the duration of the COVID-19 declaration under Section 564(b)(1) of the Act, 21 U.S.C. section 360bbb-3(b)(1), unless the authorization is terminated or revoked sooner. Performed at [facility], [place] [address], [place], [place] [address]   MRSA PCR Screening     Status: None   Collection Time: 08/15/19 10:59 AM   Specimen: Nasal Mucosa; Nasopharyngeal  Result Value Ref Range Status   MRSA by PCR NEGATIVE NEGATIVE Final    Comment:        The GeneXpert MRSA Assay (FDA approved for NASAL specimens only), is one component of a comprehensive MRSA colonization surveillance program. It is not intended to diagnose MRSA infection nor to guide or monitor treatment for MRSA infections. Performed at [facility], [place] [address], [place], [place] [address]   Culture, respiratory (non-expectorated)     Status: None (Preliminary result)   Collection Time: 08/23/19 10:01 AM   Specimen: Tracheal Aspirate; Respiratory  Result Value Ref Range Status   Specimen Description   Final    TRACHEAL ASPIRATE Performed at [facility] [address] [place], [place] [address]    Special Requests   Final    NONE Performed at [facility], [place] [address] [place], [place] [address]    Gram Stain   Final    ABUNDANT WBC PRESENT,BOTH PMN AND MONONUCLEAR MODERATE GRAM POSITIVE RODS RARE GRAM NEGATIVE RODS RARE GRAM POSITIVE COCCI Performed at [facility], [place] [address] [place], [place] [address]    Culture PENDING  Incomplete   Report Status PENDING  Incomplete     Labs: Basic Metabolic Panel: Recent Labs  Lab 08/20/19 0320  08/21/19 0300  08/22/19 0523 08/22/19 0524  08/22/19 2035 08/23/19 0515 08/23/19 1550 08/23/19 1736 08/23/19 2320  NA 140   < > 143   < >  --  141   < > 145 144 147* 146* 142  K 5.0   < > 4.2   < >  --  4.1   < > 4.3 4.8 5.1 4.9 4.7  CL 109  --  110  --   --  105  --   --  106 115*  --   --   CO2 18*  --  18*  --   --  18*  --   --  20* 22  --   --   GLUCOSE 142*  --  89  --   --  96  --   --  210* 211*  --   --   BUN 109*  --  107*  --   --  116*  --   --  116* 118*  --   --   CREATININE 2.27*  --  1.88*  --   --  2.54*  --   --  4.00* 3.15*  --   --   CALCIUM 8.6*  --  8.8*  --   --  8.9  --   --  8.0* 7.8*  --   --   MG 3.0*  --  2.9*  --  3.0*  --   --   --  2.7* 2.7*  --   --   PHOS  --   --  5.7*  --  5.7* 5.6*  --   --  9.8*   9.8* 8.0*  --   --    < > = values in this interval not displayed.   Liver Function Tests: Recent Labs  Lab 08/17/19 0415 08/18/19 0741 08/19/19 0420 08/21/19 0300 08/22/19 0524 08/23/19 0515 08/23/19 1550  AST 38 53* 42*  --   --   --   --   ALT 19 23 24   --   --   --   --   ALKPHOS 58 61 66  --   --   --   --   BILITOT 0.6 1.0 0.9  --   --   --   --   PROT 7.8 7.8 7.6  --   --   --   --   ALBUMIN 3.2* 3.2* 3.4* 3.5 3.8 3.3* 3.1*   No results for input(s): LIPASE, AMYLASE in the last 168 hours. No results for input(s): AMMONIA in the last 168 hours. CBC: Recent Labs  Lab 08/19/19 0420 08/20/19 0320  08/21/19 0300  08/22/19 0523 08/22/19 1456 08/22/19 2035 08/23/19 0515 08/23/19 1736 08/23/19 2320  WBC  14.2* 17.0*  --  18.9*  --  22.7*  --    --  36.3*  --   --   HGB 14.0 13.4   < > 14.5   < > 14.0 14.3 13.3 13.0 11.6* 12.2  HCT 44.4 42.5   < > 45.2   < > 43.5 42.0 39.0 41.8 34.0* 36.0  MCV 89.5 90.6  --  88.1  --  88.8  --   --  92.3  --   --   PLT 166 157  --  161  --  170  --   --  156  --   --    < > = values in this interval not displayed.   Cardiac Enzymes: No results for input(s): CKTOTAL, CKMB, CKMBINDEX, TROPONINI in the last 168 hours. D-Dimer Recent Labs    08/22/19 0523  DDIMER 3.86*   BNP: Invalid input(s): POCBNP CBG: Recent Labs  Lab 08/23/19 0721 08/23/19 1153 08/23/19 1545 08/23/19 2012 08/23/19 2339  GLUCAP 173* 187* 216* 148* 146*   Anemia work up No results for input(s): VITAMINB12, FOLATE, FERRITIN, TIBC, IRON, RETICCTPCT in the last 72 hours. Urinalysis    Component Value Date/Time   COLORURINE YELLOW 10/09/2016 0913   APPEARANCEUR HAZY (A) 10/09/2016 0913   LABSPEC >=1.030 09/14/2017 0946   PHURINE 5.5 09/14/2017 0946   GLUCOSEU NEGATIVE 09/14/2017 0946   HGBUR NEGATIVE 09/14/2017 0946   HGBUR negative 06/13/2010 0000   BILIRUBINUR small (A) 04/29/2019 1509   KETONESUR negative 04/29/2019 1509   KETONESUR NEGATIVE 09/14/2017 0946   PROTEINUR trace (A) 04/29/2019 1509   PROTEINUR NEGATIVE 09/14/2017 0946   UROBILINOGEN 2.0 (A) 04/29/2019 1509   UROBILINOGEN 0.2 09/14/2017 0946   NITRITE Negative 04/29/2019 1509   NITRITE NEGATIVE 09/14/2017 0946   LEUKOCYTESUR Negative 04/29/2019 1509   Sepsis Labs Invalid input(s): PROCALCITONIN,  WBC,  LACTICIDVEN   SIGNED:  Vianne Bulls, MD  [facility] 09-Sep-2019, 3:04 AM Pager   If 7PM-7AM, please contact night-coverage [email or web address] Password [email or web address]

## 2019-09-20 NOTE — Progress Notes (Signed)
ANTICOAGULATION CONSULT NOTE - Follow Up Consult  Pharmacy Consult for Heparin Indication: atrial fibrillation  No Known Allergies  Patient Measurements: Height: _0  (165.1 cm) Weight: (!) 313 lb 4.4 oz (142.1 kg) IBW/kg (Calculated) : 57 Heparin Dosing Weight: 92 kg  Vital Signs: Temp: 97.4 F (36.3 C) (11/04 0000) Temp Source: Oral (11/04 0000) BP: 115/25 (11/04 0010) Pulse Rate: 59 (11/04 0010)  Labs: Recent Labs    08/21/19 0300  08/22/19 0523 08/22/19 0524  08/23/19 0515 08/23/19 0916 08/23/19 1150 08/23/19 1550 08/23/19 1736 08/23/19 2025 08/23/19 2320  HGB 14.5   < > 14.0  --    < > 13.0  --   --   --  11.6*  --  12.2  HCT 45.2   < > 43.5  --    < > 41.8  --   --   --  34.0*  --  36.0  PLT 161  --  170  --   --  156  --   --   --   --   --   --   LABPROT 15.2  --  15.5*  --   --  15.8*  --   --   --   --   --   --   INR 1.2  --  1.2  --   --  1.3*  --   --   --   --   --   --   HEPARINUNFRC  --   --   --   --    < > >2.20* >2.20* >2.20*  --   --  1.46*  --   HEPRLOWMOCWT >2.00  --   --   --   --   --   --   --   --   --   --   --   CREATININE 1.88*  --   --  2.54*  --  4.00*  --   --  3.15*  --   --   --    < > = values in this interval not displayed.    Estimated Creatinine Clearance: 28.3 mL/min (A) (by C-G formula based on SCr of 3.15 mg/dL (H)).   Medications:  Scheduled:  . artificial tears  1 application Both Eyes A5B  . chlorhexidine gluconate (MEDLINE KIT)  15 mL Mouth Rinse BID  . Chlorhexidine Gluconate Cloth  6 each Topical Daily  . clonazePAM  1 mg Per Tube BID  . docusate  100 mg Per Tube BID  . feeding supplement (PRO-STAT SUGAR FREE 64)  30 mL Per Tube 5 X Daily  . feeding supplement (VITAL 1.5 CAL)  1,000 mL Per Tube Q24H  . fentaNYL (SUBLIMAZE) injection  50 mcg Intravenous Once  . furosemide  80 mg Intravenous Q8H  . insulin aspart  0-9 Units Subcutaneous TID WC  . insulin glargine  12 Units Subcutaneous QHS  . mouth rinse  15 mL  Mouth Rinse BID  . mouth rinse  15 mL Mouth Rinse 10 times per day  . metolazone  10 mg Oral Daily  . oxyCODONE  5 mg Per Tube Q6H  . polyethylene glycol  17 g Per Tube Daily   Infusions:  .  prismasol BGK 4/2.5 500 mL/hr at 08/23/19 1626  .  prismasol BGK 4/2.5 300 mL/hr at 08/23/19 1630  . anidulafungin    . ceFEPime (MAXIPIME) IV Stopped (08/23/19 2320)  . cisatracurium (NIMBEX) infusion 2 mcg/kg/min (20-Sep-2019 0011)  .  DOPamine 2.5 mcg/kg/min (08-29-19 0000)  . famotidine (PEPCID) IV 20 mg (08/23/19 1438)  . fentaNYL infusion INTRAVENOUS 200 mcg/hr (08/23/19 2200)  . midazolam 6 mg/hr (08/29/19 0037)  . norepinephrine (LEVOPHED) Adult infusion 20 mcg/min (29-Aug-2019 0042)  . phenylephrine (NEO-SYNEPHRINE) Adult infusion 50 mcg/min (Aug 29, 2019 0000)  . prismasol BGK 4/2.5 1,500 mL/hr at 08/23/19 2043  . vancomycin    . vasopressin (PITRESSIN) infusion - *FOR SHOCK* 0.03 Units/min (08/23/19 1900)    Assessment: 58 YOF with COVID-19 associated hypoxia enrolled in treatment arm of COVID-PACT trial on Lovenox treatment arm. Given new AFib, pharmacy consulted to start IV heparin, then changed to Lovenox d/t lack of IV access.  She has worsening AKI and an HD cath was placed on 11/2.  - Lovenox peak level was supratherapeutic at >2 on 11/1 on Lovenox 130 mg twice daily. Lovenox was reduced to 28m BID. - Most recent Lovenox given 11/2 at 11:15 AM.  - Heparin Level > 2.2 (drawn from midline on left, heparin infusing through right IJ) - CBC: Hgb and Plt stable, WNL - SCr up to 4  Goal of Therapy:  Heparin level 0.3-0.7 units/ml Monitor platelets by anticoagulation protocol: Yes   Plan:  Repeat HL at 09:20 remains >2.2 Repeat HL at 11:50 remains > 2.2 Repeat HL at 20:25 is 1.46 Suspect that pt is not clearing previous Lovenox doses (last given 11/2 AM) since the heparin infusion was off at 08:00 this morning and HL remain supratherapeutic. Rn reports small/moderate oral bleeding and  pink tinge to tube feeds returned on LIS.  MD is aware.  No further changes at this time.  RN to monitor.  - HL still high at 1.46. Will obtain another level with AM labs and will continue to hold heparin pending AM lab results   Plan:  Continue to hold heparin and recheck HL with AM labs    NRoyetta Asal PharmD, BCPS [id number] AM

## 2019-09-20 NOTE — Progress Notes (Signed)
Post mortem care completed and pt transported to [facility].

## 2019-09-20 NOTE — Code Documentation (Addendum)
This RN noted patient in [place] rhythm at 0220, called for help in the room. Rhythm changed from vtach to vfib. Charge nurse notified of events happening, called code, notified MD's Dr. Myna Hidalgo and Dr. Oletta Darter. Crash cart obtained. Colleagues and this RN starting administering code medications per Dr. Myna Hidalgo instructions. Sedation drips paused. Dr. Myna Hidalgo present at bedside called patient's family. Meds given during code event: 7 rounds of 1 mg epinephrine IVP, 300mg  amiodarone, 2g magnesium sulfate. Dr. Myna Hidalgo pronounced patient expired at 33.

## 2019-09-20 DEATH — deceased

## 2019-12-26 ENCOUNTER — Ambulatory Visit: Payer: BLUE CROSS/BLUE SHIELD | Admitting: Internal Medicine

## 2019-12-27 ENCOUNTER — Ambulatory Visit: Payer: BLUE CROSS/BLUE SHIELD | Admitting: Internal Medicine
